# Patient Record
Sex: Female | Born: 1955 | Race: Black or African American | Hispanic: No | Marital: Married | Smoking: Former smoker
Health system: Southern US, Community
[De-identification: ages and names within clinical notes are randomized; demographics above are authoritative.]

## PROBLEM LIST (undated history)

## (undated) DIAGNOSIS — R0600 Dyspnea, unspecified: Secondary | ICD-10-CM

## (undated) DIAGNOSIS — E119 Type 2 diabetes mellitus without complications: Secondary | ICD-10-CM

## (undated) DIAGNOSIS — E785 Hyperlipidemia, unspecified: Secondary | ICD-10-CM

## (undated) DIAGNOSIS — K279 Peptic ulcer, site unspecified, unspecified as acute or chronic, without hemorrhage or perforation: Secondary | ICD-10-CM

## (undated) DIAGNOSIS — M1990 Unspecified osteoarthritis, unspecified site: Secondary | ICD-10-CM

## (undated) DIAGNOSIS — I10 Essential (primary) hypertension: Secondary | ICD-10-CM

## (undated) DIAGNOSIS — I739 Peripheral vascular disease, unspecified: Secondary | ICD-10-CM

## (undated) DIAGNOSIS — D649 Anemia, unspecified: Secondary | ICD-10-CM

## (undated) DIAGNOSIS — H269 Unspecified cataract: Secondary | ICD-10-CM

## (undated) DIAGNOSIS — K219 Gastro-esophageal reflux disease without esophagitis: Secondary | ICD-10-CM

## (undated) DIAGNOSIS — N809 Endometriosis, unspecified: Secondary | ICD-10-CM

## (undated) DIAGNOSIS — I6529 Occlusion and stenosis of unspecified carotid artery: Secondary | ICD-10-CM

## (undated) DIAGNOSIS — D329 Benign neoplasm of meninges, unspecified: Secondary | ICD-10-CM

## (undated) DIAGNOSIS — E042 Nontoxic multinodular goiter: Secondary | ICD-10-CM

## (undated) HISTORY — DX: Peptic ulcer, site unspecified, unspecified as acute or chronic, without hemorrhage or perforation: K27.9

## (undated) HISTORY — DX: Unspecified osteoarthritis, unspecified site: M19.90

## (undated) HISTORY — DX: Unspecified cataract: H26.9

## (undated) HISTORY — DX: Type 2 diabetes mellitus without complications: E11.9

## (undated) HISTORY — DX: Hyperlipidemia, unspecified: E78.5

## (undated) HISTORY — PX: CT RADIATION THERAPY GUIDE: HXRAD513

## (undated) HISTORY — PX: CYST EXCISION: SHX5701

## (undated) HISTORY — PX: TUBAL LIGATION: SHX77

## (undated) HISTORY — DX: Occlusion and stenosis of unspecified carotid artery: I65.29

## (undated) HISTORY — DX: Benign neoplasm of meninges, unspecified: D32.9

## (undated) HISTORY — PX: OVARIAN CYST REMOVAL: SHX89

## (undated) HISTORY — PX: OTHER SURGICAL HISTORY: SHX169

## (undated) HISTORY — DX: Peripheral vascular disease, unspecified: I73.9

## (undated) HISTORY — DX: Endometriosis, unspecified: N80.9

## (undated) HISTORY — PX: ORIF ANKLE FRACTURE: SUR919

## (undated) HISTORY — PX: SHOULDER SURGERY: SHX246

## (undated) HISTORY — DX: Nontoxic multinodular goiter: E04.2

---
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
## 1997-09-16 ENCOUNTER — Inpatient Hospital Stay (HOSPITAL_COMMUNITY): Admission: AD | Admit: 1997-09-16 | Discharge: 1997-09-16 | Payer: Self-pay | Admitting: *Deleted

## 1997-10-30 ENCOUNTER — Observation Stay (HOSPITAL_COMMUNITY): Admission: RE | Admit: 1997-10-30 | Discharge: 1997-10-31 | Payer: Self-pay | Admitting: *Deleted

## 1998-08-20 ENCOUNTER — Other Ambulatory Visit: Admission: RE | Admit: 1998-08-20 | Discharge: 1998-08-20 | Payer: Self-pay | Admitting: *Deleted

## 1999-01-17 ENCOUNTER — Inpatient Hospital Stay (HOSPITAL_COMMUNITY): Admission: AD | Admit: 1999-01-17 | Discharge: 1999-01-17 | Payer: Self-pay | Admitting: Obstetrics & Gynecology

## 1999-08-25 ENCOUNTER — Other Ambulatory Visit: Admission: RE | Admit: 1999-08-25 | Discharge: 1999-08-25 | Payer: Self-pay | Admitting: *Deleted

## 1999-12-21 ENCOUNTER — Ambulatory Visit (HOSPITAL_BASED_OUTPATIENT_CLINIC_OR_DEPARTMENT_OTHER): Admission: RE | Admit: 1999-12-21 | Discharge: 1999-12-21 | Payer: Self-pay | Admitting: Orthopaedic Surgery

## 2000-05-16 ENCOUNTER — Ambulatory Visit (HOSPITAL_BASED_OUTPATIENT_CLINIC_OR_DEPARTMENT_OTHER): Admission: RE | Admit: 2000-05-16 | Discharge: 2000-05-16 | Payer: Self-pay | Admitting: Orthopaedic Surgery

## 2000-08-23 ENCOUNTER — Other Ambulatory Visit: Admission: RE | Admit: 2000-08-23 | Discharge: 2000-08-23 | Payer: Self-pay | Admitting: *Deleted

## 2000-09-11 ENCOUNTER — Encounter: Payer: Self-pay | Admitting: *Deleted

## 2000-09-11 ENCOUNTER — Ambulatory Visit (HOSPITAL_COMMUNITY): Admission: RE | Admit: 2000-09-11 | Discharge: 2000-09-11 | Payer: Self-pay | Admitting: *Deleted

## 2000-12-13 ENCOUNTER — Emergency Department (HOSPITAL_COMMUNITY): Admission: EM | Admit: 2000-12-13 | Discharge: 2000-12-13 | Payer: Self-pay | Admitting: Emergency Medicine

## 2001-05-25 ENCOUNTER — Encounter: Payer: Self-pay | Admitting: Emergency Medicine

## 2001-05-25 ENCOUNTER — Emergency Department (HOSPITAL_COMMUNITY): Admission: EM | Admit: 2001-05-25 | Discharge: 2001-05-25 | Payer: Self-pay | Admitting: Emergency Medicine

## 2001-06-16 ENCOUNTER — Emergency Department (HOSPITAL_COMMUNITY): Admission: EM | Admit: 2001-06-16 | Discharge: 2001-06-16 | Payer: Self-pay | Admitting: Emergency Medicine

## 2001-06-16 ENCOUNTER — Encounter: Payer: Self-pay | Admitting: Emergency Medicine

## 2001-08-28 ENCOUNTER — Other Ambulatory Visit: Admission: RE | Admit: 2001-08-28 | Discharge: 2001-08-28 | Payer: Self-pay | Admitting: *Deleted

## 2001-09-10 ENCOUNTER — Encounter: Payer: Self-pay | Admitting: Urology

## 2001-09-10 ENCOUNTER — Ambulatory Visit (HOSPITAL_COMMUNITY): Admission: RE | Admit: 2001-09-10 | Discharge: 2001-09-10 | Payer: Self-pay | Admitting: Urology

## 2001-09-17 ENCOUNTER — Encounter: Payer: Self-pay | Admitting: *Deleted

## 2001-09-17 ENCOUNTER — Ambulatory Visit (HOSPITAL_COMMUNITY): Admission: RE | Admit: 2001-09-17 | Discharge: 2001-09-17 | Payer: Self-pay | Admitting: *Deleted

## 2001-11-20 ENCOUNTER — Emergency Department (HOSPITAL_COMMUNITY): Admission: EM | Admit: 2001-11-20 | Discharge: 2001-11-20 | Payer: Self-pay | Admitting: Emergency Medicine

## 2002-05-22 ENCOUNTER — Encounter: Payer: Self-pay | Admitting: Emergency Medicine

## 2002-05-22 ENCOUNTER — Emergency Department (HOSPITAL_COMMUNITY): Admission: EM | Admit: 2002-05-22 | Discharge: 2002-05-22 | Payer: Self-pay | Admitting: Emergency Medicine

## 2002-08-27 ENCOUNTER — Other Ambulatory Visit: Admission: RE | Admit: 2002-08-27 | Discharge: 2002-08-27 | Payer: Self-pay | Admitting: *Deleted

## 2002-09-20 ENCOUNTER — Ambulatory Visit (HOSPITAL_COMMUNITY): Admission: RE | Admit: 2002-09-20 | Discharge: 2002-09-20 | Payer: Self-pay | Admitting: *Deleted

## 2002-09-20 ENCOUNTER — Encounter: Payer: Self-pay | Admitting: *Deleted

## 2003-03-25 LAB — HM COLONOSCOPY

## 2003-09-03 ENCOUNTER — Other Ambulatory Visit: Admission: RE | Admit: 2003-09-03 | Discharge: 2003-09-03 | Payer: Self-pay | Admitting: Obstetrics and Gynecology

## 2003-10-14 ENCOUNTER — Encounter: Admission: RE | Admit: 2003-10-14 | Discharge: 2003-10-14 | Payer: Self-pay | Admitting: Obstetrics and Gynecology

## 2004-06-26 ENCOUNTER — Inpatient Hospital Stay (HOSPITAL_COMMUNITY): Admission: EM | Admit: 2004-06-26 | Discharge: 2004-06-28 | Payer: Self-pay | Admitting: Emergency Medicine

## 2004-08-30 ENCOUNTER — Other Ambulatory Visit: Admission: RE | Admit: 2004-08-30 | Discharge: 2004-08-30 | Payer: Self-pay | Admitting: Obstetrics and Gynecology

## 2004-10-25 ENCOUNTER — Ambulatory Visit (HOSPITAL_COMMUNITY): Admission: RE | Admit: 2004-10-25 | Discharge: 2004-10-25 | Payer: Self-pay | Admitting: Obstetrics and Gynecology

## 2004-12-06 ENCOUNTER — Ambulatory Visit (HOSPITAL_BASED_OUTPATIENT_CLINIC_OR_DEPARTMENT_OTHER): Admission: RE | Admit: 2004-12-06 | Discharge: 2004-12-06 | Payer: Self-pay | Admitting: Orthopedic Surgery

## 2005-04-04 ENCOUNTER — Ambulatory Visit: Payer: Self-pay | Admitting: Internal Medicine

## 2005-04-11 ENCOUNTER — Ambulatory Visit: Payer: Self-pay | Admitting: Internal Medicine

## 2005-09-07 ENCOUNTER — Other Ambulatory Visit: Admission: RE | Admit: 2005-09-07 | Discharge: 2005-09-07 | Payer: Self-pay | Admitting: Obstetrics and Gynecology

## 2005-09-12 ENCOUNTER — Ambulatory Visit: Payer: Self-pay | Admitting: Internal Medicine

## 2005-09-19 ENCOUNTER — Encounter: Payer: Self-pay | Admitting: Internal Medicine

## 2005-09-19 LAB — CONVERTED CEMR LAB

## 2005-10-03 ENCOUNTER — Emergency Department (HOSPITAL_COMMUNITY): Admission: EM | Admit: 2005-10-03 | Discharge: 2005-10-04 | Payer: Self-pay | Admitting: Emergency Medicine

## 2005-10-27 ENCOUNTER — Ambulatory Visit (HOSPITAL_COMMUNITY): Admission: RE | Admit: 2005-10-27 | Discharge: 2005-10-27 | Payer: Self-pay | Admitting: Obstetrics and Gynecology

## 2005-11-24 ENCOUNTER — Encounter: Admission: RE | Admit: 2005-11-24 | Discharge: 2005-11-24 | Payer: Self-pay | Admitting: Obstetrics and Gynecology

## 2005-12-01 ENCOUNTER — Ambulatory Visit: Payer: Self-pay | Admitting: Endocrinology

## 2005-12-07 ENCOUNTER — Ambulatory Visit: Payer: Self-pay | Admitting: *Deleted

## 2005-12-12 ENCOUNTER — Ambulatory Visit: Payer: Self-pay | Admitting: Endocrinology

## 2005-12-18 ENCOUNTER — Ambulatory Visit (HOSPITAL_COMMUNITY): Admission: RE | Admit: 2005-12-18 | Discharge: 2005-12-18 | Payer: Self-pay | Admitting: Endocrinology

## 2005-12-28 ENCOUNTER — Other Ambulatory Visit: Admission: RE | Admit: 2005-12-28 | Discharge: 2005-12-28 | Payer: Self-pay | Admitting: Interventional Radiology

## 2005-12-28 ENCOUNTER — Encounter (INDEPENDENT_AMBULATORY_CARE_PROVIDER_SITE_OTHER): Payer: Self-pay | Admitting: Specialist

## 2005-12-28 ENCOUNTER — Encounter: Admission: RE | Admit: 2005-12-28 | Discharge: 2005-12-28 | Payer: Self-pay | Admitting: Endocrinology

## 2005-12-29 ENCOUNTER — Ambulatory Visit: Payer: Self-pay | Admitting: Internal Medicine

## 2006-01-12 ENCOUNTER — Ambulatory Visit: Payer: Self-pay | Admitting: Internal Medicine

## 2006-03-10 ENCOUNTER — Ambulatory Visit: Payer: Self-pay | Admitting: Internal Medicine

## 2006-04-19 ENCOUNTER — Ambulatory Visit: Payer: Self-pay | Admitting: Cardiovascular Disease

## 2006-04-19 ENCOUNTER — Encounter: Payer: Self-pay | Admitting: Cardiovascular Disease

## 2006-04-19 ENCOUNTER — Ambulatory Visit (HOSPITAL_COMMUNITY): Admission: RE | Admit: 2006-04-19 | Discharge: 2006-04-19 | Payer: Self-pay | Admitting: Neurosurgery

## 2006-05-05 ENCOUNTER — Ambulatory Visit: Payer: Self-pay | Admitting: Internal Medicine

## 2006-05-05 LAB — CONVERTED CEMR LAB
AST: 17 units/L (ref 0–37)
Alkaline Phosphatase: 86 units/L (ref 39–117)
BUN: 11 mg/dL (ref 6–23)
Basophils Absolute: 0 10*3/uL (ref 0.0–0.1)
CO2: 28 meq/L (ref 19–32)
Calcium: 9.1 mg/dL (ref 8.4–10.5)
Chol/HDL Ratio, serum: 3.7
Cholesterol: 166 mg/dL (ref 0–200)
Eosinophil percent: 2.3 % (ref 0.0–5.0)
HDL: 44.9 mg/dL (ref >39.0)
Hemoglobin, Urine: NEGATIVE
Hemoglobin: 10.8 g/dL — ABNORMAL LOW (ref 12.0–15.0)
Ketones, ur: NEGATIVE mg/dL
LDL Cholesterol: 113 mg/dL — ABNORMAL HIGH (ref 0–99)
Lymphocytes Relative: 18.9 % (ref 12.0–46.0)
MCHC: 33.4 g/dL (ref 30.0–36.0)
MCV: 84.3 fL (ref 78.0–100.0)
Monocytes Absolute: 0.7 10*3/uL (ref 0.2–0.7)
Monocytes Relative: 7.6 % (ref 3.0–11.0)
Neutro Abs: 6.2 10*3/uL (ref 1.4–7.7)
Neutrophils Relative %: 71.1 % (ref 43.0–77.0)
Sodium: 144 meq/L (ref 135–145)
TSH: 1.83 microintl units/mL (ref 0.35–5.50)
Total Bilirubin: 0.5 mg/dL (ref 0.3–1.2)

## 2006-05-12 ENCOUNTER — Ambulatory Visit: Payer: Self-pay | Admitting: Internal Medicine

## 2006-06-07 ENCOUNTER — Ambulatory Visit: Payer: Self-pay | Admitting: Internal Medicine

## 2006-09-17 ENCOUNTER — Emergency Department (HOSPITAL_COMMUNITY): Admission: EM | Admit: 2006-09-17 | Discharge: 2006-09-17 | Payer: Self-pay | Admitting: *Deleted

## 2006-09-18 ENCOUNTER — Ambulatory Visit (HOSPITAL_COMMUNITY): Admission: RE | Admit: 2006-09-18 | Discharge: 2006-09-18 | Payer: Self-pay | Admitting: Obstetrics and Gynecology

## 2006-09-20 LAB — CONVERTED CEMR LAB

## 2006-10-30 ENCOUNTER — Encounter: Admission: RE | Admit: 2006-10-30 | Discharge: 2006-10-30 | Payer: Self-pay | Admitting: Obstetrics and Gynecology

## 2007-01-10 ENCOUNTER — Ambulatory Visit: Payer: Self-pay | Admitting: Endocrinology

## 2007-01-12 ENCOUNTER — Encounter: Admission: RE | Admit: 2007-01-12 | Discharge: 2007-01-12 | Payer: Self-pay | Admitting: Endocrinology

## 2007-02-06 ENCOUNTER — Encounter: Payer: Self-pay | Admitting: Internal Medicine

## 2007-02-06 DIAGNOSIS — I6529 Occlusion and stenosis of unspecified carotid artery: Secondary | ICD-10-CM

## 2007-02-06 DIAGNOSIS — D320 Benign neoplasm of cerebral meninges: Secondary | ICD-10-CM | POA: Insufficient documentation

## 2007-02-06 DIAGNOSIS — I739 Peripheral vascular disease, unspecified: Secondary | ICD-10-CM | POA: Insufficient documentation

## 2007-02-06 DIAGNOSIS — E042 Nontoxic multinodular goiter: Secondary | ICD-10-CM | POA: Insufficient documentation

## 2007-02-06 DIAGNOSIS — F959 Tic disorder, unspecified: Secondary | ICD-10-CM | POA: Insufficient documentation

## 2007-02-06 DIAGNOSIS — Z87898 Personal history of other specified conditions: Secondary | ICD-10-CM | POA: Insufficient documentation

## 2007-02-06 DIAGNOSIS — I779 Disorder of arteries and arterioles, unspecified: Secondary | ICD-10-CM | POA: Insufficient documentation

## 2007-05-03 ENCOUNTER — Encounter (INDEPENDENT_AMBULATORY_CARE_PROVIDER_SITE_OTHER): Payer: Self-pay | Admitting: *Deleted

## 2007-05-03 ENCOUNTER — Ambulatory Visit: Payer: Self-pay | Admitting: Internal Medicine

## 2007-05-03 LAB — CONVERTED CEMR LAB
BUN: 7 mg/dL (ref 6–23)
Basophils Absolute: 0.1 10*3/uL (ref 0.0–0.1)
Bilirubin, Direct: 0.1 mg/dL (ref 0.0–0.3)
CO2: 31 meq/L (ref 19–32)
Chloride: 105 meq/L (ref 96–112)
Creatinine, Ser: 0.7 mg/dL (ref 0.4–1.2)
GFR calc Af Amer: 114 mL/min
Glucose, Bld: 115 mg/dL — ABNORMAL HIGH (ref 70–99)
HCT: 32.8 % — ABNORMAL LOW (ref 36.0–46.0)
Hemoglobin, Urine: NEGATIVE
Ketones, ur: NEGATIVE mg/dL
LDL Cholesterol: 120 mg/dL — ABNORMAL HIGH (ref 0–99)
MCHC: 33.8 g/dL (ref 30.0–36.0)
Neutrophils Relative %: 68.2 % (ref 43.0–77.0)
Nitrite: NEGATIVE
Potassium: 3.4 meq/L — ABNORMAL LOW (ref 3.5–5.1)
RBC: 3.75 M/uL — ABNORMAL LOW (ref 3.87–5.11)
RDW: 15.5 % — ABNORMAL HIGH (ref 11.5–14.6)
Sodium: 140 meq/L (ref 135–145)
TSH: 1.2 microintl units/mL (ref 0.35–5.50)
Total Bilirubin: 0.5 mg/dL (ref 0.3–1.2)
Total Protein, Urine: NEGATIVE mg/dL
Total Protein: 7 g/dL (ref 6.0–8.3)
Urobilinogen, UA: 0.2 (ref 0.0–1.0)
VLDL: 19 mg/dL (ref 0–40)
pH: 6.5 (ref 5.0–8.0)

## 2007-05-14 ENCOUNTER — Ambulatory Visit: Payer: Self-pay | Admitting: Internal Medicine

## 2007-05-14 DIAGNOSIS — K279 Peptic ulcer, site unspecified, unspecified as acute or chronic, without hemorrhage or perforation: Secondary | ICD-10-CM | POA: Insufficient documentation

## 2007-06-29 ENCOUNTER — Ambulatory Visit (HOSPITAL_BASED_OUTPATIENT_CLINIC_OR_DEPARTMENT_OTHER): Admission: RE | Admit: 2007-06-29 | Discharge: 2007-06-29 | Payer: Self-pay | Admitting: Orthopedic Surgery

## 2007-08-10 ENCOUNTER — Encounter: Payer: Self-pay | Admitting: Internal Medicine

## 2007-11-05 ENCOUNTER — Ambulatory Visit (HOSPITAL_COMMUNITY): Admission: RE | Admit: 2007-11-05 | Discharge: 2007-11-05 | Payer: Self-pay | Admitting: Obstetrics and Gynecology

## 2007-12-13 ENCOUNTER — Encounter: Payer: Self-pay | Admitting: Internal Medicine

## 2007-12-20 ENCOUNTER — Telehealth (INDEPENDENT_AMBULATORY_CARE_PROVIDER_SITE_OTHER): Payer: Self-pay | Admitting: *Deleted

## 2007-12-23 ENCOUNTER — Encounter: Admission: RE | Admit: 2007-12-23 | Discharge: 2007-12-23 | Payer: Self-pay | Admitting: Orthopaedic Surgery

## 2008-01-07 ENCOUNTER — Ambulatory Visit: Payer: Self-pay | Admitting: Endocrinology

## 2008-01-21 ENCOUNTER — Ambulatory Visit: Payer: Self-pay | Admitting: Internal Medicine

## 2008-01-21 DIAGNOSIS — R252 Cramp and spasm: Secondary | ICD-10-CM | POA: Insufficient documentation

## 2008-01-22 LAB — CONVERTED CEMR LAB
BUN: 17 mg/dL (ref 6–23)
CO2: 28 meq/L (ref 19–32)
GFR calc Af Amer: 113 mL/min
Glucose, Bld: 87 mg/dL (ref 70–99)
Potassium: 4.4 meq/L (ref 3.5–5.1)
Sodium: 138 meq/L (ref 135–145)

## 2008-01-23 ENCOUNTER — Encounter: Payer: Self-pay | Admitting: Internal Medicine

## 2008-02-02 ENCOUNTER — Emergency Department (HOSPITAL_COMMUNITY): Admission: EM | Admit: 2008-02-02 | Discharge: 2008-02-02 | Payer: Self-pay | Admitting: Emergency Medicine

## 2008-02-04 ENCOUNTER — Ambulatory Visit: Payer: Self-pay | Admitting: Internal Medicine

## 2008-08-11 ENCOUNTER — Encounter: Payer: Self-pay | Admitting: Internal Medicine

## 2008-08-14 ENCOUNTER — Ambulatory Visit: Payer: Self-pay | Admitting: Internal Medicine

## 2008-08-14 LAB — CONVERTED CEMR LAB
AST: 26 units/L (ref 0–37)
Alkaline Phosphatase: 81 units/L (ref 39–117)
Basophils Absolute: 0.1 10*3/uL (ref 0.0–0.1)
Basophils Relative: 1 % (ref 0.0–3.0)
Bilirubin, Direct: 0.1 mg/dL (ref 0.0–0.3)
Calcium: 9.1 mg/dL (ref 8.4–10.5)
Cholesterol: 190 mg/dL (ref 0–200)
Creatinine, Ser: 0.7 mg/dL (ref 0.4–1.2)
Eosinophils Absolute: 0.4 10*3/uL (ref 0.0–0.7)
GFR calc Af Amer: 113 mL/min
HCT: 33.1 % — ABNORMAL LOW (ref 36.0–46.0)
Hemoglobin: 11.2 g/dL — ABNORMAL LOW (ref 12.0–15.0)
Ketones, ur: NEGATIVE mg/dL
Leukocytes, UA: NEGATIVE
Lymphocytes Relative: 24.5 % (ref 12.0–46.0)
MCHC: 33.7 g/dL (ref 30.0–36.0)
MCV: 87 fL (ref 78.0–100.0)
Monocytes Absolute: 0.6 10*3/uL (ref 0.1–1.0)
Neutro Abs: 4.3 10*3/uL (ref 1.4–7.7)
RBC: 3.81 M/uL — ABNORMAL LOW (ref 3.87–5.11)
RDW: 15.6 % — ABNORMAL HIGH (ref 11.5–14.6)
Sodium: 141 meq/L (ref 135–145)
Specific Gravity, Urine: 1.025 (ref 1.000–1.035)
Total Bilirubin: 0.7 mg/dL (ref 0.3–1.2)
Total CHOL/HDL Ratio: 4.1
Triglycerides: 48 mg/dL (ref 0–149)
pH: 6 (ref 5.0–8.0)

## 2008-08-17 ENCOUNTER — Encounter: Payer: Self-pay | Admitting: Internal Medicine

## 2008-09-03 LAB — CONVERTED CEMR LAB

## 2008-09-15 ENCOUNTER — Ambulatory Visit: Payer: Self-pay | Admitting: Internal Medicine

## 2008-11-10 ENCOUNTER — Ambulatory Visit (HOSPITAL_COMMUNITY): Admission: RE | Admit: 2008-11-10 | Discharge: 2008-11-10 | Payer: Self-pay | Admitting: Internal Medicine

## 2008-11-10 LAB — HM MAMMOGRAPHY: HM Mammogram: NEGATIVE

## 2008-12-16 ENCOUNTER — Encounter: Payer: Self-pay | Admitting: Internal Medicine

## 2009-06-01 ENCOUNTER — Encounter: Payer: Self-pay | Admitting: Internal Medicine

## 2009-06-09 ENCOUNTER — Ambulatory Visit: Payer: Self-pay | Admitting: Internal Medicine

## 2009-06-09 DIAGNOSIS — I10 Essential (primary) hypertension: Secondary | ICD-10-CM | POA: Insufficient documentation

## 2009-06-23 ENCOUNTER — Ambulatory Visit: Payer: Self-pay | Admitting: Internal Medicine

## 2009-06-23 LAB — CONVERTED CEMR LAB
CO2: 30 meq/L (ref 19–32)
Chloride: 102 meq/L (ref 96–112)
Potassium: 3.9 meq/L (ref 3.5–5.1)
Sodium: 139 meq/L (ref 135–145)

## 2009-06-24 ENCOUNTER — Telehealth: Payer: Self-pay | Admitting: Internal Medicine

## 2009-08-14 ENCOUNTER — Encounter: Payer: Self-pay | Admitting: Internal Medicine

## 2009-08-25 ENCOUNTER — Ambulatory Visit: Payer: Self-pay | Admitting: Internal Medicine

## 2009-08-25 LAB — CONVERTED CEMR LAB: Magnesium: 2.4 mg/dL (ref 1.5–2.5)

## 2009-09-14 ENCOUNTER — Ambulatory Visit: Payer: Self-pay | Admitting: Internal Medicine

## 2009-09-14 LAB — CONVERTED CEMR LAB
AST: 21 units/L (ref 0–37)
Alkaline Phosphatase: 96 units/L (ref 39–117)
Basophils Absolute: 0.1 10*3/uL (ref 0.0–0.1)
Bilirubin, Direct: 0.1 mg/dL (ref 0.0–0.3)
Calcium: 8.8 mg/dL (ref 8.4–10.5)
Cholesterol: 183 mg/dL (ref 0–200)
Creatinine, Ser: 0.8 mg/dL (ref 0.4–1.2)
GFR calc non Af Amer: 96.48 mL/min (ref >60)
Hemoglobin: 11.1 g/dL — ABNORMAL LOW (ref 12.0–15.0)
LDL Cholesterol: 120 mg/dL — ABNORMAL HIGH (ref 0–99)
Lymphocytes Relative: 23.2 % (ref 12.0–46.0)
Monocytes Relative: 9.4 % (ref 3.0–12.0)
Neutro Abs: 5.5 10*3/uL (ref 1.4–7.7)
Neutrophils Relative %: 63.1 % (ref 43.0–77.0)
Platelets: 262 10*3/uL (ref 150.0–400.0)
Potassium: 3.8 meq/L (ref 3.5–5.1)
RDW: 15.9 % — ABNORMAL HIGH (ref 11.5–14.6)
Specific Gravity, Urine: >=1.03 (ref 1.000–1.030)
TSH: 1.8 microintl units/mL (ref 0.35–5.50)
Total Bilirubin: 0.5 mg/dL (ref 0.3–1.2)
Total Protein: 6.9 g/dL (ref 6.0–8.3)
Urine Glucose: NEGATIVE mg/dL
Urobilinogen, UA: 0.2 (ref 0.0–1.0)

## 2009-09-17 ENCOUNTER — Ambulatory Visit: Payer: Self-pay | Admitting: Internal Medicine

## 2009-11-11 ENCOUNTER — Ambulatory Visit (HOSPITAL_COMMUNITY): Admission: RE | Admit: 2009-11-11 | Discharge: 2009-11-11 | Payer: Self-pay | Admitting: Obstetrics and Gynecology

## 2009-12-15 ENCOUNTER — Encounter: Payer: Self-pay | Admitting: Endocrinology

## 2010-06-20 HISTORY — PX: TRIGGER FINGER RELEASE: SHX641

## 2010-07-11 ENCOUNTER — Encounter: Payer: Self-pay | Admitting: Obstetrics and Gynecology

## 2010-07-22 NOTE — Letter (Signed)
Summary: [facility]  [facility]   Imported By: Sherian Rein 09/07/2009 08:23:48  _____________________________________________________________________  External Attachment:    Type:   Image     Comment:   External Document

## 2010-07-22 NOTE — Assessment & Plan Note (Signed)
Summary: HANDS LEGS FEET CRAMPS--DR MEN PT STC   Vital Signs:  Patient profile:   55 year old female Height:      66 inches (167.64 cm) Weight:      186.0 pounds (84.55 kg) O2 Sat:      97 % on Room air Temp:     97.2 degrees F (36.22 degrees C) oral Pulse rate:   85 / minute BP sitting:   98 / 62  (right arm) Cuff size:   large  Vitals Entered By: Orlan Leavens (August 25, 2009 1:21 PM)  O2 Flow:  Room air CC: cramps in legs & feet, also in hands Is Patient Diabetic? No Pain Assessment Patient in pain? yes     Location: legs, feet and hands Type: ongoing cramps   Primary Care Provider:  Norins  CC:  cramps in legs & feet and also in hands.  History of Present Illness: c/o cramps in BLE - onset 2 weeks ago - can occur with exertion such as walking as well as nocturnal (laying in bed at night) spasms last 3 minutes to over 10 minutes feels sore in thighs after cramp resolves no muscle weakness - no medication changes - no new OTC or rx meds  Clinical Review Panels:  CBC   WBC:  7.1 (08/14/2008)   RBC:  3.81 (08/14/2008)   Hgb:  11.2 (08/14/2008)   Hct:  33.1 (08/14/2008)   Platelets:  225 (08/14/2008)   MCV  87.0 (08/14/2008)   MCHC  33.7 (08/14/2008)   RDW  15.6 (08/14/2008)   PMN:  60.2 (08/14/2008)   Lymphs:  24.5 (08/14/2008)   Monos:  9.1 (08/14/2008)   Eosinophils:  5.2 (08/14/2008)   Basophil:  1.0 (08/14/2008)  Complete Metabolic Panel   Glucose:  100 (06/23/2009)   Sodium:  139 (06/23/2009)   Potassium:  3.9 (06/23/2009)   Chloride:  102 (06/23/2009)   CO2:  30 (06/23/2009)   BUN:  12 (06/23/2009)   Creatinine:  0.9 (06/23/2009)   Albumin:  3.7 (08/14/2008)   Total Protein:  7.0 (08/14/2008)   Calcium:  9.0 (06/23/2009)   Total Bili:  0.7 (08/14/2008)   Alk Phos:  81 (08/14/2008)   SGPT (ALT):  19 (08/14/2008)   SGOT (AST):  26 (08/14/2008)   Current Medications (verified): 1)  Protonix 40 Mg  Pack (Pantoprazole Sodium) .... Every Am 2)   Ambien 10 Mg  Tabs (Zolpidem Tartrate) .... Qhs 3)  Aspirin 325mg  .... Once Daily 4)  Excedrin Migraine 250-250-65 Mg  Tabs (Aspirin-Acetaminophen-Caffeine) .... As Needed 5)  Klor-Con M10 10 Meq  Cr-Tabs (Potassium Chloride Crys Cr) .Marland Kitchen.. 1 By Mouth As Directed. 6)  Gabapentin 100 Mg  Caps (Gabapentin) .Marland Kitchen.. 1 By Mouth Once Daily For Cramps 7)  Lisinopril-Hydrochlorothiazide 10-12.5 Mg Tabs (Lisinopril-Hydrochlorothiazide) .Marland Kitchen.. 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 Andrey Campanile [facility]        Ortho - Daldorf        Opthal - [place], Dr. Daphine Deutscher at [facility] - [facility] All        GI - Arlyce Dice        Back  surgeon - Dorna Bloom  Review of Systems  The patient denies anorexia, chest pain, syncope, and headaches.    Physical Exam  General:  Well-developed,well-nourished,in no acute distress; alert,appropriate and cooperative throughout examination - nontoxic Eyes:  vision grossly intact; pupils equal, round and reactive to light.  conjunctiva and lids normal.    Lungs:  normal respiratory effort, no intercostal retractions or use of accessory muscles; normal breath sounds bilaterally - no crackles and no wheezes.    Heart:  normal rate, regular rhythm, no murmur, and no rub. BLE without edema.  Msk:  No deformity or scoliosis noted of thoracic or lumbar spine.  no spasms or effusions   Impression & Recommendations:  Problem # 1:  CRAMP OF LIMB (ICD-729.82)  Patient with increasingly severe carpal spasm and leg cramps.   Plan: lab to include Mg and K and iron levels;  tonic water - 8-16 oz daily. also flexeril to use at night as needed   Orders: TLB-Potassium (K+)  (84132-K) TLB-Magnesium (Mg) (83735-MG) TLB-Iron, (Fe) Total (83540-FE) Prescription Created Electronically [phone])  Problem # 2:  ESSENTIAL HYPERTENSION (ICD-401.9)  low BP now - ?if med contrib to above symptoms? asked to hold med until f/u with PCP at end of this mo for CPX Her updated medication list for this problem includes:    Lisinopril-hydrochlorothiazide 10-12.5 Mg Tabs (Lisinopril-hydrochlorothiazide) ..... Hold until follow up with dr. Debby Bud  BP today: 98/62 Prior BP: 110/68 (06/23/2009)  Labs Reviewed: K+: 3.9 (06/23/2009) Creat: : 0.9 (06/23/2009)   Chol: 190 (08/14/2008)   HDL: 46.8 (08/14/2008)   LDL: 134 (08/14/2008)   TG: 48 (08/14/2008)  Complete Medication List: 1)  Protonix 40 Mg Pack (Pantoprazole sodium) .... Every am 2)  Ambien 10 Mg Tabs (Zolpidem tartrate) .... Qhs 3)  Aspirin 325mg   .... Once daily 4)  Excedrin Migraine 250-250-65 Mg Tabs (Aspirin-acetaminophen-caffeine) .... As needed 5)  Gabapentin 100 Mg Caps (Gabapentin) .Marland Kitchen.. 1 by mouth once daily for cramps 6)  Lisinopril-hydrochlorothiazide 10-12.5 Mg Tabs (Lisinopril-hydrochlorothiazide) .... Hold until follow up with dr. Debby Bud 7)  Flexeril 10 Mg Tabs (Cyclobenzaprine hcl) .Marland Kitchen.. 1 by mouth every 8 hours as needed for muscle spasms and pain  Patient Instructions: 1)  it was good to see you today. 2)  test(s) ordered today - your results will be called to you  in 48-72 hours from the time of test completion 3)  hold your blood pressure medicine until next visit with dr. Debby Bud at the end of the month 4)  try flexeril for muscle relaxants - if other medications needed for potassium or magnesium after reviewing your labs, you will be notified Prescriptions: FLEXERIL 10 MG TABS (CYCLOBENZAPRINE HCL) 1 by mouth every 8 hours as needed for muscle spasms and pain  #30 x 0   Entered and Authorized by:   Newt Lukes MD   Signed by:   Newt Lukes MD on 08/25/2009   Method used:    [facility]  [address] [id number]       [address]       [place], [place]  [address]       Ph: [phone] or [phone]       Fax: [phone]   RxID:   [id number]

## 2010-07-22 NOTE — Progress Notes (Signed)
  Phone Note Outgoing Call   Reason for Call: Discuss lab or test results Summary of Call: please call patient - metabolic panel 100% OK  TU Initial call taken by: Jacques Navy MD,  June 24, 2009 5:31 PM

## 2010-07-22 NOTE — Assessment & Plan Note (Signed)
Summary: 2 wk f/u #/cd   Vital Signs:  Patient profile:   55 year old female Height:      66 inches Weight:      187 pounds BMI:     30.29 O2 Sat:      97 % on Room air Temp:     98.0 degrees F oral Pulse rate:   73 / minute BP sitting:   110 / 68  (left arm) Cuff size:   large  Vitals Entered By: Ami Bullins CMA (June 23, 2009 1:13 PM)  O2 Flow:  Room air CC: pt here for follow up on BP. pt was put on lisinopril about 2 weeks ago and since starting medication BP readings have been averaging low 100's systolic over 70's for diastolic/ ab   Primary Care Provider:  Cherylynn Liszewski  CC:  pt here for follow up on BP. pt was put on lisinopril about 2 weeks ago and since starting medication BP readings have been averaging low 100's systolic over 70's for diastolic/ ab.  History of Present Illness: Patient seen Dec 21st for moderate hypertension and was started on lisinopril/hct. She returns for follow-up. Her home readings are great with an occasional low, e.g. 106/70. Sheis tolerating the medication well.  Current Medications (verified): 1)  Protonix 40 Mg  Pack (Pantoprazole Sodium) .... Every Am 2)  Ambien 10 Mg  Tabs (Zolpidem Tartrate) .... Qhs 3)  Aspirin 325mg  .... Once Daily 4)  Excedrin Migraine 250-250-65 Mg  Tabs (Aspirin-Acetaminophen-Caffeine) .... As Needed 5)  Klor-Con M10 10 Meq  Cr-Tabs (Potassium Chloride Crys Cr) .Marland Kitchen.. 1 By Mouth As Directed. 6)  Gabapentin 100 Mg  Caps (Gabapentin) .Marland Kitchen.. 1 By Mouth Once Daily For Cramps 7)  Lisinopril-Hydrochlorothiazide 10-12.5 Mg Tabs (Lisinopril-Hydrochlorothiazide) .Marland Kitchen.. 1 By Mouth Once Daily  Allergies (verified): No Known Drug Allergies PMH-FH-SH reviewed-no changes except otherwise noted  Review of Systems  The patient denies anorexia, weight loss, chest pain, dyspnea on exertion, prolonged cough, and headaches.    Physical Exam  General:  Well-developed,well-nourished,in no acute distress; alert,appropriate and cooperative  throughout examination Lungs:  normal respiratory effort and normal breath sounds.   Heart:  normal rate and regular rhythm.     Impression & Recommendations:  Problem # 1:  ESSENTIAL HYPERTENSION (ICD-401.9) Good control on present medication.  Plan - continue the same  Her updated medication list for this problem includes:    Lisinopril-hydrochlorothiazide 10-12.5 Mg Tabs (Lisinopril-hydrochlorothiazide) .Marland Kitchen... 1 by mouth once daily  Orders: TLB-BMP (Basic Metabolic Panel-BMET) (80048-METABOL)  Addendum - Patient: Jaime [name] Note: All result statuses are Final unless otherwise noted.  Tests: (1) BMP (METABOL)   Sodium                    139 mEq/L                   135-145   Potassium                 3.9 mEq/L                   3.5-5.1   Chloride                  102 mEq/L                   96-112   Carbon Dioxide            30 mEq/L  19-32   Glucose              [H]  100 mg/dL                   88-41   BUN                       12 mg/dL                    6-60   Creatinine                0.9 mg/dL                   6.3-0.1   Calcium                   9.0 mg/dL                   6.0-10.9   GFR                       84.29 mL/min                >60  Complete Medication List: 1)  Protonix 40 Mg Pack (Pantoprazole sodium) .... Every am 2)  Ambien 10 Mg Tabs (Zolpidem tartrate) .... Qhs 3)  Aspirin 325mg   .... Once daily 4)  Excedrin Migraine 250-250-65 Mg Tabs (Aspirin-acetaminophen-caffeine) .... As needed 5)  Klor-con M10 10 Meq Cr-tabs (Potassium chloride crys cr) .Marland Kitchen.. 1 by mouth as directed. 6)  Gabapentin 100 Mg Caps (Gabapentin) .Marland Kitchen.. 1 by mouth once daily for cramps 7)  Lisinopril-hydrochlorothiazide 10-12.5 Mg Tabs (Lisinopril-hydrochlorothiazide) .Marland Kitchen.. 1 by mouth once daily   Preventive Care Screening  Mammogram:    Date:  11/10/2008    Results:  normal bilateral     Not Administered:    Influenza Vaccine not given due to: declined

## 2010-07-22 NOTE — Letter (Signed)
Summary: [facility]  [facility]   Imported By: Sherian Rein 06/24/2009 14:20:26  _____________________________________________________________________  External Attachment:    Type:   Image     Comment:   External Document

## 2010-07-22 NOTE — Letter (Signed)
Summary: [facility]   Imported By: Sherian Rein 01/05/2010 10:23:35  _____________________________________________________________________  External Attachment:    Type:   Image     Comment:   External Document

## 2010-07-22 NOTE — Assessment & Plan Note (Signed)
Summary: CPX/[employer]/#/CD   Vital Signs:  Patient profile:   55 year old female Height:      66 inches (167.64 cm) Weight:      188 pounds (85.45 kg) BMI:     30.45 O2 Sat:      93 % on Room air Temp:     98.5 degrees F (36.94 degrees C) oral Pulse rate:   86 / minute Pulse rhythm:   regular BP sitting:   122 / 80  (left arm) Cuff size:   regular  Vitals Entered By: Brenton Grills (September 17, 2009 11:16 AM)  O2 Flow:  Room air CC: pt here for cpx/aj  Vision Screening:      Vision Comments: Last eye exam 08/14/2009 was normal   Primary Care Provider:  Norins  CC:  pt here for cpx/aj.  History of Present Illness: Patient presents for general medical exam. She was seen recently for severe cramps 3/11. She was advised to stop HCTZ and she was better but did have recurrent cramps, less severe, recently. K, Mg, Iron were normal with Iron being 59mcg/dl below normal.  Of note her  BP is well controlled.  She has seen her gyn for normal exam earlier this month - Dr. Stefano Gaul.  Current Medications (verified): 1)  Protonix 40 Mg  Pack (Pantoprazole Sodium) .... Every Am 2)  Ambien 10 Mg  Tabs (Zolpidem Tartrate) .... As Needed 3)  Aspirin 325mg  .... Once Daily 4)  Excedrin Migraine 250-250-65 Mg  Tabs (Aspirin-Acetaminophen-Caffeine) .... As Needed 5)  Gabapentin 100 Mg  Caps (Gabapentin) .Marland Kitchen.. 1 By Mouth Once Daily For Cramps 6)  Lisinopril-Hydrochlorothiazide 10-12.5 Mg Tabs (Lisinopril-Hydrochlorothiazide) .... Hold Until Follow Up With Dr. Debby Bud 7)  Flexeril 10 Mg Tabs (Cyclobenzaprine Hcl) .Marland Kitchen.. 1 By Mouth Every 8 Hours As Needed For Muscle Spasms and Pain 8)  Iron 325 (65 Fe) Mg Tabs (Ferrous Sulfate) .Marland Kitchen.. 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 Andrey [facility]        Ortho - Daldorf        Opthal - [place], Dr. 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 married 1989 daughter-1979, 2 step-sons. 2 grandchildren  Risk Factors: Alcohol Use: 0 (09/15/2008) Caffeine Use: 3 servings: 2 soft drinks, 1 tea (09/15/2008) Exercise: yes (09/15/2008)  Risk Factors: Smoking Status: quit (09/15/2008)  Review of Systems  The patient denies anorexia, fever, weight loss, weight gain, vision loss, decreased hearing, chest pain, syncope, dyspnea on exertion, prolonged cough, hemoptysis, abdominal pain, hematochezia, muscle weakness, difficulty walking, depression, abnormal bleeding, and angioedema.    Physical Exam  General:  Overweight AA female in distress Head:  Normocephalic and atraumatic without obvious abnormalities.  No apparent alopecia or balding. Eyes:  No corneal or conjunctival inflammation noted. EOMI. Perrla. Funduscopic exam benign, without hemorrhages, exudates or papilledema. Vision grossly normal. Ears:  External ear exam shows no significant lesions or deformities.  Otoscopic examination reveals clear canals, tympanic membranes are intact bilaterally without bulging, retraction, inflammation or discharge. Hearing is grossly normal bilaterally. Nose:  no external deformity and no external erythema.   Mouth:  Oral mucosa and oropharynx without lesions or exudates.   Teeth in good repair. Neck:  supple, full ROM, no thyromegaly, and no carotid bruits.   Chest Wall:  No deformities, masses, or tenderness noted. Lungs:  Normal respiratory effort, chest expands symmetrically. Lungs are clear to auscultation, no crackles or wheezes. Heart:  Normal rate and regular rhythm. S1 and S2 normal without gallop, murmur, click, rub or other extra sounds. Abdomen:  soft, non-tender, normal bowel sounds, no distention, no guarding, no rigidity, and no hepatomegaly.   Genitalia:  deferred Msk:  normal ROM, no joint tenderness, no joint swelling, no joint warmth, and no joint deformities.   Pulses:  2+ radial pulses Extremities:  trace left pedal edema.   Neurologic:  alert & oriented X3, cranial nerves II-XII intact, strength normal in all extremities, sensation intact to light touch, gait normal, and DTRs symmetrical and normal.   Skin:  turgor normal, color normal, no rashes, no purpura, and no ulcerations.   Cervical Nodes:  no anterior cervical adenopathy and no posterior cervical adenopathy.   Psych:  Oriented X3, memory intact for recent and remote, normally interactive, and not anxious appearing.     Impression & Recommendations:  Problem # 1:  ESSENTIAL HYPERTENSION (ICD-401.9)  Her updated medication list for this problem includes:    Lisinopril-hydrochlorothiazide 10-12.5 Mg Tabs (Lisinopril-hydrochlorothiazide) ..... Hold until follow up with dr. Debby Bud  BP today: 122/80 Prior BP: 98/62 (08/25/2009)  Good control on no medication.  Plan - monitor BP           if SBP runs consistently greater than 130 will need to consider restarting medication.  Problem # 2:  CRAMP OF LIMB (ICD-729.82) Labs are normal. Still having some cramps.  Plan - trial of tonic water, if this fails gabapentin 100mg  at bedtime can be tried.  Problem # 3:  PERIPHERAL VASCULAR DISEASE (ICD-443.9) Stable with no active complaints  Problem # 4:  GOITER, MULTINODULAR  (ICD-241.1) Stable  Problem # 5:  Preventive Health Care (ICD-V70.0) Normal physical exam. Current with gynecology exam and breast exam. Current with mammography, colonosocopy, tetnus immunization.  In summary - a nice woman who appears to be medically stable. She will report back if her BP is running above goal. She will return as needed.  Complete Medication List: 1)  Protonix 40 Mg Pack (Pantoprazole sodium) .... Every am 2)  Ambien 10 Mg Tabs (Zolpidem tartrate) .... As needed 3)  Aspirin 325mg   .... Once daily 4)  Excedrin Migraine 250-250-65 Mg Tabs (Aspirin-acetaminophen-caffeine) .... As needed 5)  Gabapentin 100 Mg Caps (Gabapentin) .Marland Kitchen.. 1 by mouth once daily for cramps 6)  Lisinopril-hydrochlorothiazide 10-12.5 Mg Tabs (Lisinopril-hydrochlorothiazide) .... Hold until follow up with dr. Debby Bud 7)  Flexeril 10 Mg Tabs (Cyclobenzaprine hcl) .Marland Kitchen.. 1 by mouth every 8 hours as needed for muscle spasms and pain 8)  Iron 325 (65 Fe) Mg Tabs (Ferrous sulfate) .Marland Kitchen.. 1 by mouth once daily    Note: All result statuses are Final unless otherwise noted.  Tests: (1) BMP (METABOL)   Sodium  142 mEq/L                   135-145   Potassium                 3.8 mEq/L                   3.5-5.1   Chloride                  104 mEq/L                   96-112   Carbon Dioxide            31 mEq/L                    19-32   Glucose                   97 mg/dL                    16-10   BUN                       7 mg/dL                     9-60   Creatinine                0.8 mg/dL                   4.5-4.0   Calcium                   8.8 mg/dL                   9.8-11.9   GFR                       96.48 mL/min                >60  Tests: (2) CBC Platelet w/Diff (CBCD)   White Cell Count          8.7 K/uL                    4.5-10.5   Red Cell Count       [L]  3.82 Mil/uL                 3.87-5.11   Hemoglobin           [L]  11.1 g/dL                   14.7-82.9   Hematocrit            [L]  33.2 %                      36.0-46.0   MCV                       86.9 fl                     78.0-100.0   MCHC                      33.6 g/dL                   56.2-13.0  RDW                  [H]  15.9 %                      11.5-14.6   Platelet Count            262.0 K/uL                  150.0-400.0   Neutrophil %              63.1 %                      43.0-77.0   Lymphocyte %              23.2 %                      12.0-46.0   Monocyte %                9.4 %                       3.0-12.0   Eosinophils%              3.3 %                       0.0-5.0   Basophils %               1.0 %                       0.0-3.0   Neutrophill Absolute      5.5 K/uL                    1.4-7.7   Lymphocyte Absolute       2.0 K/uL                    0.7-4.0   Monocyte Absolute         0.8 K/uL                    0.1-1.0  Eosinophils, Absolute                             0.3 K/uL                    0.0-0.7   Basophils Absolute        0.1 K/uL                    0.0-0.1  Tests: (3) Hepatic/Liver Function Panel (HEPATIC)   Total Bilirubin           0.5 mg/dL                   6.9-6.2   Direct Bilirubin          0.1 mg/dL                   9.5-2.8   Alkaline Phosphatase      96 U/L                      39-117   AST  21 U/L                      0-37   ALT                       19 U/L                      0-35   Total Protein             6.9 g/dL                    0.4-5.4   Albumin                   3.5 g/dL                    0.9-8.1  Tests: (4) TSH (TSH)   FastTSH                   1.80 uIU/mL                 0.35-5.50  Tests: (5) Lipid Panel (LIPID)   Cholesterol               183 mg/dL                   1-914     ATP III Classification            Desirable:  < 200 mg/dL                    Borderline High:  200 - 239 mg/dL               High:  > = 240 mg/dL   Triglycerides             54.0 mg/dL                  7.8-295.6     Normal:  <150 mg/dL     Borderline  High:  150 - 199 mg/dL   HDL                       21.30 mg/dL                 >86.57   VLDL Cholesterol          10.8 mg/dL                  8.4-69.6   LDL Cholesterol      [H]  295 mg/dL                   2-84  CHO/HDL Ratio:  CHD Risk                             4                    Men          Women     1/2 Average Risk     3.4          3.3     Average Risk          5.0          4.4     2X Average Risk  9.6          7.1     3X Average Risk          15.0          11.0                           Tests: (6) UDip Only (UDIP)   Color                     YELLOW       RANGE:  Yellow;Lt. Yellow   Clarity                   CLEAR                       Clear   Specific Gravity          >=1.030                     1.000 - 1.030   Urine Ph                  6.5                         5.0-8.0   Protein                   NEGATIVE                    Negative   Urine Glucose             NEGATIVE                    Negative   Ketones                   NEGATIVE                    Negative   Urine Bilirubin           NEGATIVE                    Negative   Blood                     NEGATIVE                    Negative   Urobilinogen              0.2                         0.0 - 1.0   Leukocyte Esterace        NEGATIVE                    Negative   Nitrite                   NEGATIVE                    Negative Prescriptions: GABAPENTIN 100 MG  CAPS (GABAPENTIN) 1 by mouth once daily for cramps  #30 x 12   Entered and Authorized by:   Jacques Navy MD   Signed by:   Jacques Navy MD on 09/17/2009   Method used:   [facility]  [address] [id number]       [address]       [place], [place]  [address]       Ph: [phone] or [phone]       Fax: [phone]   RxID:   [id number] PROTONIX 40 MG  PACK (PANTOPRAZOLE SODIUM) every AM  #30 x 12   Entered and Authorized by:   Jacques Navy MD   Signed by:   Jacques Navy MD on  09/17/2009   Method used:   [facility]  [address] [id number]       [address]       [place], [place]  [address]       Ph: [phone] or [phone]       Fax: [phone]   RxID:   [id number]    Preventive Care Screening  Pap Smear:    Date:  09/08/2009    Results:  normal     Not Administered:    Influenza Vaccine # 1 not given due to: declined

## 2010-08-04 ENCOUNTER — Encounter: Payer: Self-pay | Admitting: Internal Medicine

## 2010-08-25 ENCOUNTER — Encounter: Payer: Self-pay | Admitting: Internal Medicine

## 2010-08-26 NOTE — Letter (Signed)
Summary: Velna Ochs MD  Velna Ochs MD   Imported By: Lester Lebanon 08/20/2010 09:39:11  _____________________________________________________________________  External Attachment:    Type:   Image     Comment:   External Document

## 2010-09-16 NOTE — Letter (Signed)
Summary: [facility]  [facility]   Imported By: Lennie Odor 09/06/2010 15:36:43  _____________________________________________________________________  External Attachment:    Type:   Image     Comment:   External Document

## 2010-09-30 ENCOUNTER — Ambulatory Visit (INDEPENDENT_AMBULATORY_CARE_PROVIDER_SITE_OTHER): Payer: 59 | Admitting: Internal Medicine

## 2010-09-30 ENCOUNTER — Encounter: Payer: Self-pay | Admitting: Internal Medicine

## 2010-09-30 ENCOUNTER — Other Ambulatory Visit (INDEPENDENT_AMBULATORY_CARE_PROVIDER_SITE_OTHER): Payer: 59

## 2010-09-30 VITALS — BP 138/76 | HR 86 | Temp 98.3°F | Ht 66.0 in | Wt 198.0 lb

## 2010-09-30 DIAGNOSIS — I10 Essential (primary) hypertension: Secondary | ICD-10-CM

## 2010-09-30 DIAGNOSIS — K279 Peptic ulcer, site unspecified, unspecified as acute or chronic, without hemorrhage or perforation: Secondary | ICD-10-CM

## 2010-09-30 DIAGNOSIS — Z136 Encounter for screening for cardiovascular disorders: Secondary | ICD-10-CM

## 2010-09-30 DIAGNOSIS — D509 Iron deficiency anemia, unspecified: Secondary | ICD-10-CM

## 2010-09-30 DIAGNOSIS — Z0000 Encounter for general adult medical examination without abnormal findings: Secondary | ICD-10-CM

## 2010-09-30 LAB — IBC PANEL
Iron: 31 ug/dL — ABNORMAL LOW (ref 42–145)
Saturation Ratios: 8.6 % — ABNORMAL LOW (ref 20.0–50.0)
Transferrin: 258.2 mg/dL (ref 212.0–360.0)

## 2010-09-30 LAB — CBC WITH DIFFERENTIAL/PLATELET
Basophils Relative: 0.8 % (ref 0.0–3.0)
Eosinophils Absolute: 0.5 10*3/uL (ref 0.0–0.7)
Eosinophils Relative: 4.6 % (ref 0.0–5.0)
Hemoglobin: 9.9 g/dL — ABNORMAL LOW (ref 12.0–15.0)
Lymphocytes Relative: 27.4 % (ref 12.0–46.0)
MCHC: 33.4 g/dL (ref 30.0–36.0)
MCV: 81.9 fl (ref 78.0–100.0)
Monocytes Absolute: 0.9 10*3/uL (ref 0.1–1.0)
Neutro Abs: 6.2 10*3/uL (ref 1.4–7.7)
Neutrophils Relative %: 58.7 % (ref 43.0–77.0)
RBC: 3.61 Mil/uL — ABNORMAL LOW (ref 3.87–5.11)
WBC: 10.6 10*3/uL — ABNORMAL HIGH (ref 4.5–10.5)

## 2010-09-30 LAB — COMPREHENSIVE METABOLIC PANEL
AST: 19 U/L (ref 0–37)
Albumin: 3.6 g/dL (ref 3.5–5.2)
BUN: 13 mg/dL (ref 6–23)
Calcium: 8.6 mg/dL (ref 8.4–10.5)
Chloride: 101 mEq/L (ref 96–112)
Creatinine, Ser: 0.9 mg/dL (ref 0.4–1.2)
Glucose, Bld: 89 mg/dL (ref 70–99)
Potassium: 3.4 mEq/L — ABNORMAL LOW (ref 3.5–5.1)

## 2010-09-30 MED ORDER — BENZONATATE 100 MG PO CAPS: 100.0000 mg | ORAL_CAPSULE | Freq: Three times a day (TID) | ORAL | Status: DC | PRN

## 2010-09-30 MED ORDER — GABAPENTIN 100 MG PO CAPS: 100.0000 mg | ORAL_CAPSULE | Freq: Every day | ORAL | Status: DC | PRN

## 2010-09-30 NOTE — Progress Notes (Signed)
Subjective:    Patient ID: Jaime [name], female    DOB: 1955-09-03, 55 y.o.   MRN: [id number]  HPI Jaime [name] presents for general medical exam. She has been doing well except for a persistent cough for the past 3 months: she describes a non-productive cough that feels like there is something in her throat. She is off ACE-I, she takes PPI. She has not had any SOB, DOE, no enlarged lymph nodes, weight loss. She does have night sweats which she attributes to menopause.   She is due to see Dr. Stefano [name] for pelvic/pap and breast exam. She had a mammogram in '11 and is waiting for card from [facility] hospital. She had colonoscopy '09.   Past Medical History  Diagnosis Date  . PVD (peripheral vascular disease)   . Migraine   . Meningioma   . 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 Nontoxic multinodular goiter   . Peptic ulcer disease    Past Surgical History  Procedure Date  . Tubal ligation   . Tonsillectomy 1965  . Ovarian cyst removal   . Orif ankle fracture     right   Family History  Problem Relation Age of Onset  . Hypertension Mother   . Diabetes Mother   . Esophageal cancer Father   . Breast cancer Neg Hx   . Coronary artery disease Neg Hx   . Colon cancer Other    History   Social History  . Marital Status: Married    Spouse Name: N/A    Number of Children: N/A  . Years of Education: N/A   Occupational History  . Computer operator  [employer]   Social History Main Topics  . Smoking status: Not on file  . Smokeless tobacco: Not on file  . Alcohol Use:   . Drug Use:   . Sexually Active:    Other Topics Concern  . Not on file   Social History Narrative   MD Roster:Opthal - [place], Dr Daphine Deutscher at [facility] surgeon - [facility], 2 years at [employer] [facility] 1989Daughter - 1979, 2 step-sons2 grand-children      Review of Systems Review of Systems  Constitutional:  Negative for fever, chills, activity change  and unexpected weight change.  HENT:  Negative for hearing loss, ear pain, congestion, neck stiffness and postnasal drip.   Eyes: Negative for pain, discharge and visual disturbance.  Respiratory: Negative for chest tightness and wheezing.   Cardiovascular: Negative for chest pain and palpitations.       [No decreased exercise tolerance Gastrointestinal: [No change in bowel habit. No bloating or gas. No reflux or indigestion Genitourinary: Negative for urgency, frequency, flank pain and difficulty urinating.  Musculoskeletal: Negative for myalgias, back pain, arthralgias and gait problem.  Neurological: Negative for dizziness, tremors, weakness and headaches.  Hematological: Negative for adenopathy.  Psychiatric/Behavioral: Negative for behavioral problems and dysphoric mood.       Objective:   Physical Exam Physical Exam  Constitutional: She is oriented to person, place, and time. Vital signs are normal. She appears well-developed and well-nourished.       pleasant AA woman in no distress  HENT:  Head: Normocephalic and atraumatic.  Right Ear: External ear normal.  Left Ear: External ear normal.       EACs and TMs normal  Eyes: Conjunctivae and EOM are normal. Pupils are equal, round, and reactive to light. No scleral icterus.  Neck: Normal range of motion. Neck supple. No  JVD present. No thyromegaly present.  Cardiovascular: Normal rate, regular rhythm and normal heart sounds.  Exam reveals no friction rub.   No murmur heard.      Radial and Dorsalis Pedis pulses normal  Pulmonary/Chest: Effort normal and breath sounds normal. No respiratory distress. She has no wheezes. She has no rales.       No chest wall deformity with normal A-P diameter. Breast exam: deferred to Dr. Stefano [name]  Abdominal: Soft. Bowel sounds are normal. She exhibits no distension and no mass. There is no tenderness. There is no guarding.       No hepatosplenomegaly  Genitourinary:       Exam deferred to  [facility]  Musculoskeletal: Normal range of motion. She exhibits no edema and no tenderness.       No joint swelling, no synovial thickening, no deformity of the small, medium or large joints except for right 5th digit in a brace  Lymphadenopathy:    She has no cervical adenopathy.  Neurological: She is alert and oriented to person, place, and time. She has normal reflexes. No cranial nerve deficit. Coordination normal.       Normal facial symmetry, normal gross motor strength throughout, no tremor or cogwheeling, normal rapid finger movement.  Skin: Skin is warm and dry. No rash noted. No erythema.       No suspicious lesions. Normal turgor. Nails are normal.  Psychiatric: She has a normal mood and affect. Her behavior is normal. Thought content normal.       Normal recall        Assessment & Plan:  1. High blood pressure - adequate control on present medications.  Plan - continue meds           Routine lab  2. Anemia - will check blood counts and iron levels  Plan - recommendations to follow with lab results  3. Goiter - will check thyroid   4. Health maintenance- unremarkable history except for cough. PHysical exam is ok. Please make your appointment with Dr. Stefano [name]. Current with colonoscopy. Immunizations are up to date.   In summary - a very nice woman who appears medically stable who needs follow-up of iron deficiency anemia.

## 2010-10-28 ENCOUNTER — Other Ambulatory Visit: Payer: Self-pay | Admitting: Internal Medicine

## 2010-11-02 NOTE — Op Note (Signed)
NAMEKATARYNA, Jaime [name]              ACCOUNT NO.:  [id number]   MEDICAL RECORD NO.:  [id number]          PATIENT TYPE:  AMB   LOCATION:  DSC                          FACILITY:  [facility]   PHYSICIAN:  Harvie Junior, M.D.   DATE OF BIRTH:  [birth year]   DATE OF PROCEDURE:  06/29/2007  DATE OF DISCHARGE:                               OPERATIVE REPORT   PREOPERATIVE DIAGNOSIS:  1. Small mass right foot, painful.  2. Retained foreign body of an incision in the right foot.   POSTOPERATIVE DIAGNOSIS:  1. Small mass right foot, painful.  2. Retained foreign body of an incision in the right foot.   PROCEDURE:  1. Excision of mass right foot with corresponding debridement of      metatarsal bone of the fourth and fifth metatarsals.  2. Excision of foreign body from a previous wound in the right ankle.   SURGEON:  Harvie Junior, M.D.   ASSISTANT:  Marshia Ly, P.A.   ANESTHESIA:  General.   BRIEF HISTORY:  [name] is a 55 year old female with a long history  of having had a significant painful mass at essentially the base of her  fourth metatarsal anteriorly.  She had been treated conservatively for a  period of time.  Because of continued complaints of pain and failure to  conservative care she ultimately was taken to the operating room for  excision of mass.  She also was noted to have what appeared to be a  retained stitch up over the old area where we did an ankle arthroscopy  on her and we wanted to explore that while she was asleep.   PROCEDURE IN DETAIL:  The patient was taken to the operating room.  Once  adequate anesthesia was obtained with general anesthetic the patient was  placed on the operating table.  The right foot was prepped and draped in  the usual sterile fashion.  Following this the leg was prepped and  draped in the usual sterile fashion.  The leg was exsanguinated for 3  minutes and then blood pressure tourniquet was inflated to 250 mmHg.  Following this the  wound over her old ankle arthroscopy was explored and  we did find a nylon stitch in this area.  The stitch was removed.  It  was sort of keratinous skin and sort of heaped up skin and so we felt  that the most perfect course of action at this point was to excise this  heaped up portion of skin after the foreign body had been removed and  then close it and this was accomplished.  Following this a small  incision was made over the mass distally, what did appear to be a  ganglion cyst.  This was excised in total from the base of the fourth  and fifth metatarsals anteriorly and then some bone was rongeured away  from the base of the fourth and fifth metatarsals anteriorly.  At this  point  the knee was then copiously irrigated and thoroughly irrigated and  suctioned dry.  The arthroscopic portals were closed with bandage and  sterile compressive dressing was applied and the patient was taken to  recovery where she was noted to be in satisfactory condition.  Estimated  blood loss for the procedure was minimal.      Harvie Junior, M.D.  Electronically Signed     JLG/MEDQ  D:  06/29/2007  T:  06/29/2007  Job:  [id number]

## 2010-11-05 NOTE — Op Note (Signed)
NAMEMADDELINE, Jaime [name]              ACCOUNT NO.:  [id number]   MEDICAL RECORD NO.:  [id number]          PATIENT TYPE:  INP   LOCATION:  0482                         FACILITY:  [facility]   PHYSICIAN:  Harvie Junior, M.D.   DATE OF BIRTH:  [birth year]   DATE OF PROCEDURE:  DATE OF DISCHARGE:                                 OPERATIVE REPORT   NOTE:  This procedure was done at [facility].   PREOPERATIVE DIAGNOSIS:   POSTOPERATIVE DIAGNOSIS:   PROCEDURE:   SURGEON:  Harvie Junior, M.D.   ANESTHESIA:  General.   INDICATIONS FOR PROCEDURE:  This is a 55 year old female who fell outside of  her home.  She ultimately suffered a fracture dislocation of the ankle. She  was seen in the [facility] Emergency Department, I was over at  [facility], and was consulted for treatment of the injury.  We ultimately asked  the emergency room physician to do a reduction. They were unable to reduce  the dislocated portion, and we were thus compelled to do open reduction  internal fixation on her.   We discussed treatment options with a bimalleolar, bad fracture dislocation.  Operative intervention was the only appropriate course of action. The  patient was brought to the operating room for that procedure.   DESCRIPTION OF PROCEDURE:  The patient was brought to the operating room and  after adequate anesthesia was obtained with general anesthesia, patient  placed supine on the operating table.  The right leg was prepped and draped  in the usual sterile fashion. Following this, lateral incision was made,  subcutaneous tissue reflected to the level of the lateral malleolus  fracture, some comminuted fractures were identified.  This was held  essentially anatomically reduced.   Attention was then turned medially, where the medial malleolus fragment was  identified.  After incision medially and exposure of the fragment, two 50 mm  malleolar screws were used to get anatomic reduction.   Laterally, we  achieved anatomic reduction with interfragmentary screw and a 6-hole plate  contoured to the ankle.  Fluoroscopy was used throughout the case. The  posterior malleolus was identified, and there was kind of a crush component  to the posterior malleolus, but no fragment that looked big enough to put  any kind of screw in; and certainly, it was all less than 20% of the joint  surface.   At this point, the wounds were copiously irrigated and suctioned dry, closed  in layers.  Sterile, compressive dressing was applied as well as a  even, posterior split.  The patient was admitted to the hospital for  postoperative PT and monitoring.      JLG/MEDQ  D:  06/26/2004  T:  06/26/2004  Job:  [id number]

## 2010-11-05 NOTE — Assessment & Plan Note (Signed)
[facility]                               PULMONARY OFFICE NOTE   Jaime [name], Jaime [name]                     MRN:          [id number]  DATE:12/29/2005                            DOB:          1955-07-25    REQUESTING PHYSICIAN:  Dr. Everardo All   REASON FOR CONSULTATION:  Cough.   HISTORY:  A 55 year old black female with acute onset of cough after  arriving on a bus back from gambling in a casino where she was exposed to  heavy smoke.  The cough has been persistent since that time.  Seems to be  worse with walking, but also occurs at night and is typically dry but  occasionally she coughs so hard she vomits and also has trouble with urinary  incontinence.  She does have history of asthma at age 39, but outgrew it and  states she has no significant dyspnea, nocturnal wheeze, fevers, chills,  sweats, overt reflux, or sinus complaints.  She has already been treated for  cyclical cough by Dr. Debby Bud which included a regimen for reflux and also  failed to respond to prednisone.   PAST MEDICAL HISTORY:  Significant for asthma diagnosed at age 27, chronic  headaches, and remote ankle surgery.  Significant for a left thyroid nodule  status post ultrasound-guided biopsy within the past week.   ALLERGIES:  None known.   MEDICATIONS:  Protonix 40 mg when she remembers to take it.   SOCIAL HISTORY:  She quit smoking 25 years ago.  She works as a Estate manager/land agent.   FAMILY HISTORY:  Significant for the absence of sarcoid, rheumatologic  disease, asthma, or atopy.   REVIEW OF SYSTEMS:  Taken in detail on the worksheet- none except for  problems outlined above.   PHYSICAL EXAMINATION:  GENERAL:  This is a pleasant, ambulatory black female  in no acute distress.  VITAL SIGNS:  Stable vital signs.  HEENT:  Unremarkable.  Oropharynx is clear.  NECK:  Without cervical adenopathy or tenderness.  Trachea is midline.  No  thyromegaly.  LUNGS:  Lung fields  perfectly clear bilaterally to auscultation and  percussion.  CARDIAC:  There is regular rate and rhythm without murmurs, rubs, or gallops  present.  ABDOMEN:  Soft, benign.  EXTREMITIES:  Warm without calf tenderness, clubbing, cyanosis, edema.   LABORATORY DATA:  Chest and head CT scan was reviewed from December 07, 2005 and  reveal evidence of a right cavernous sinus enhancing lesion consistent  with a meningioma.  No mention of sinusitis on the report.  Chest CT scan  report was reviewed from December 08, 2005, indicates mildly enlarged bilateral  hilar mediastinal nodes.  She also has a hiatal hernia on CT scan.   IMPRESSION:  1.  Acute onset of cough associated with vomiting after exposure to crowds      and cigarette smoke.  The differential diagnosis should include      Bordetella exposure but treatment now directed at Bordetella, even if      she had it, is very unlikely to be helpful.  Most likely Dr.  Norins is      correct that the cough is cyclical in nature and that she coughs so much      and clears her throat that she develops secondary regurgitation and      urinary incontinence.  Of the two, of course, regurgitation is going to      perpetuate more coughing where as urinary incontinence is simply a      nuisance.   Therefore, I recommended treating her aggressively for reflux by asking her  to increase her PPI therapy not to when she thinks about it, but perfectly  regular b.i.d. for two weeks along with Reglan 10 mg before meals at bedtime  and dietary modifications that I reviewed with her in writing in detail.  I  will give her another six-day course of prednisone and will see her back in  two weeks to see how she is doing.  If the cough continues the next step in  the work-up would probably be to do a methacholine challenge test and  consider bronchoscopy early on in this patient with a question of hilar  adenopathy.  1.  Bilateral hilar mediastinal adenopathy seen only  on CT scan and not on      plain film.  It is probably of no consequence.  I suppose she could have      had sarcoid at one point but this would be a very unusual presentation.      The work-up for this degree of mediastinoscopy is not clear because the      lymph nodes are not of a pathologic order of size and, of course,      there is no clinical need at this point to arrive at a histologic      diagnosis in a patient who has such minimal symptoms and minimal      adenopathy.  However, we do need to keep this in mind for future      reference, especially if there appears to be any gross evidence of      worsening adenopathy by plain film.  For now, the benefits are      outweighed by the risks and discomfort of the procedure.                                   Jaime [name]. Jaime Sires, MD, [facility]   MBW/MedQ  DD:  12/30/2005  DT:  12/30/2005  Job #:  [id number]   cc:   Jaime [name]. Norins, MD

## 2010-11-05 NOTE — Assessment & Plan Note (Signed)
[facility]                               PULMONARY OFFICE NOTE   Jaime [name], Jaime [name]                     MRN:          [id number]  DATE:03/10/2006                            DOB:          1955-10-17    PULMONARY/FOLLOWUP OFFICE VISIT:   HISTORY:  This is a 55 year old female with a chronic cough dating back to  March of 2007, which was so severe she coughed so hard she vomited.  The  work-up included a CT scan that showed bilateral hilar adenopathy, and she  comes back today all smiles with cough completely resolved, having  empirically treated her with Protonix b.i.d.  She denies any fevers, chills,  sweats, nausea, weight loss or ocular complaints.   PHYSICAL EXAMINATION:  GENERAL:  She is a pleasant, ambulatory black female  in no acute distress.  VITAL SIGNS:  Stable.  HEENT:  Unremarkable.  Oropharynx clear.  LUNGS:  Lung fields perfectly clear bilaterally to auscultation and  percussion.  HEART:  Regular rhythm without murmur, gallop, or rub.  ABDOMEN:  Soft, benign.  EXTREMITIES:  Warm without calf tenderness, cyanosis or clubbing.   Her plain film was reviewed from December 01, 2005 and is normal, as is today's  film.  I do see a suggestion of very mild hilar prominence, but certainly  nothing that looks pathologic.   IMPRESSION:  Cough clearly was reflux in that she coughed so hard she  vomited.  It is difficult to know in situations like this whether the  initial cough had anything to do with reflux or rather, the more likely  scenario in that, is that she had a mild illness that exacerbated a mild  chronic reflux issue.  To test this hypothesis, I recommended she reduce the  Protonix down to one daily.  However, I do not think that her cough has  anything to with the CT scan findings which show an incidental adenopathy of  a non-specific nature.  She very well could have burned out sarcoid, but  certainly does not appear to have  anything more serious than that such as  lymphoma.   I would like to see her back in 3 months for a final chest x-ray to assure  stability of the adenopathy and certainly see her back sooner if her cough  is not controlled on Protonix b.i.d.  If the cough exacerbates every time  the Protonix is reduced down to 1 daily, then I would consider having Dr.  Arlyce Dice see her again, since he is her GI physician of record.                                  Charlaine Dalton. Sherene Sires, MD, [facility]   MBW/MedQ  DD:  03/10/2006  DT:  03/13/2006  Job #:  [id number]   cc:   Rosalyn Gess. Norins, MD

## 2010-11-05 NOTE — Op Note (Signed)
[place]  Patient:    Jaime [name], Jaime [name]                     MRN: [id number] Proc. Date: 05/16/00 Adm. Date:  [id number] Attending:  Marcene Corning                           Operative Report  PREOPERATIVE DIAGNOSES: 1. Left knee chondromalacia patella. 2. Left knee subluxation patella.  POSTOPERATIVE DIAGNOSES: 1. Left knee chondromalacia patella. 2. Left knee subluxation patella. 3. Left knee lose bodies.  PROCEDURE: 1. Left knee chondroplasty patella. 2. Left knee arthroscopic lateral release. 3. Left knee removal lose bodies x 2.  ANESTHESIA:  General.  SURGEON:  Lubertha Basque. Jerl Santos, M.D.  ASSISTANT:  Prince Rome, P.A.  INDICATIONS FOR PROCEDURE:  The patient is a 55 year old woman who is many months out from a left knee arthroplasty at which point a chondroplasty of the patella was performed.  She has persisted with anterior pain and a catch inside her knee though she initially did well.  At this point she is offered a repeat arthroscopy with plans to perform a lateral release.  The procedures were discussed with the patient and informed operative consent was obtained after discussion of the possible complications of reaction to anesthesia and infection.  DESCRIPTION OF PROCEDURE:  The patient was taken to the operating suite where general anesthetic was applied without difficulty.  She was positioned supine and prepped and draped in a normal sterile fashion.  After administration of preoperative IV antibiotics an arthroscopy of the left knee was performed through a total of 3 portals.  Suprapatellar pouch was benign while the patellofemoral joint exhibited some grade 3 and 4 change in a small area of the medial patellar facet.  This was addressed with a thorough chondroplasty. In the medial compartment there was no evidence of meniscal or articular cartilage injury and the same was true of the lateral  compartment. Unfortunately there were two lose bodies about 5 mm in diameter which were in the knee.  These were cartilaginous in nature and were removed.  The patella did track in a lateral position and as a result a lateral release was performed through the additional third portal.  Once this was accomplished the knee cap tracked in a excellent position.  We used the cautery to control some bleeding through the lateral release site and the knee was thoroughly irrigated.  Marcaine with epinephrine and morphine were placed in the knee followed by Adaptic over the portals and dry gauze with a lose Ace wrap. Estimated blood loss and intraoperative fluids obtained from anesthesia records.  DISPOSITION:  The patient was extubated in the operating room and taken to recovery room in stable condition.  PLANS:  Plans were for her to go home on the same day and follow up in the office in less than a week.  I will contact her by phone tonight.            : DD:  05/16/00 TD:  05/16/00 Job: [id number]

## 2010-11-05 NOTE — Op Note (Signed)
[place]  Patient:    AIRIANA, Jaime [name]                     MRN: [id number] Proc. Date: 12/20/99 Adm. Date:  [id number] Attending:  Marcene Corning                           Operative Report  PREOPERATIVE DIAGNOSIS:  Left knee torn medial meniscus.  POSTOPERATIVE DIAGNOSES: 1. Left knee chondromalacia of patella. 2. Left knee loose bodies.  PROCEDURES: 1. Left knee chondroplasty of patella. 2. Left knee removal of loose bodies.  SURGEON:  Lubertha Basque. Jerl Santos, M.D.  ASSISTANT:  Prince Rome, P.A.  ANESTHESIA:  General.  INDICATIONS:  The patient is a 55 year old woman with two months of left knee and swelling.  This has persisted despite anti-inflammatories and activity restriction.  At this point, she is to undergo arthroscopy.  The procedure was discussed with the patient and informed operative consent was obtained after discussion of the possible complications of reaction to anesthesia and infection.  DESCRIPTION OF PROCEDURE:  The patient was taken to the operating suite where general anesthetic was applied without difficulty.  She was positioned supine and prepped and draped in the normal sterile fashion.  After the administration of preoperative antibiotics, an arthroscopy of the left knee was performed through two inferior portals.  The suprapatellar pouch was benign while the patellofemoral joint exhibited a flap tear on the medial aspect of the patella.  This was removed back to stable tissues.  The patella did track in a moderate lateral position, but no lateral release was added. She had some cartilaginous loose bodies in the lateral gutter which were removed.  The medial and lateral compartments exhibited no evidence of meniscal or articular cartilage injury.  ACL and PCL were intact and appeared normal.  The knee was thoroughly irrigated at the end of the case followed by placement of Marcaine with epinephrine and  morphine.  Adaptic was placed over the portals followed by dry gauze and a loose Ace wrap.  The estimated blood loss and intraoperative fluids can be obtained from the anesthesia records.  DISPOSITION:  The patient was extubated in the operating room and taken to the recovery room in stable condition.  Plans were for her to go home the same day and follow up in the office in less than a week.  I will contact her by phone tonight. DD:  12/20/99 TD:  12/21/99 Job: [id number]

## 2010-11-05 NOTE — Assessment & Plan Note (Signed)
[facility]                             PRIMARY CARE OFFICE NOTE   Jaime [name], Jaime [name]                       MRN:          [id number]  DATE:05/12/2006                            DOB:          10/16/1955    HISTORY:  [name] is a 55 year old African-American woman who presents  for follow-up evaluation and exam.  She was last seen in primary care by Dr.  Romero [name] on December 12, 2005.  I last saw the patient September 12, 2005.  Her  last physical exam was April 11, 2005.  In the interval since her last  visit with Dr. Everardo [name], she has been seen by Dr. Sherene [name] for evaluation of  cough.  This turned out to be fully resolved after being treated for GERD.  Please see Dr. Thurston [name] notes.   The patient has been working with [employer] and attended [facility] in [place], seen by Jaime [name], P.A.  The patient  reports no physical exam, lab tests, and EKGs were performed, but she was  started on Phentermine 37.5 mg daily.   The patient in the interval since her last full evaluation was found on CT  scan to have developed sinus infection and also subsequently to have a  cavernous sinus meningioma.  She was seen by Dr. Lovell [name] from neurosurgery,  who referred her to Dr. Andrey [name] at [facility] because the patient was  having visual problems with pressure on her optic nerve.  The patient is now  scheduled for external beam radiation therapy to shrink the tumor.  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   HEALTH MAINTENANCE:  The patient has been seen by Dr. Stefano [name] and is up to  date for GYN exam and mammogram.   PAST MEDICAL HISTORY:  1. Usual childhood diseases.  2. Diverticulitis.  3. Peptic ulcer disease.  4. Migraine.  5. UTI.  6. GERD with cough.  7. Meningioma as noted   PAST SURGICAL HISTORY:  1. Tonsillectomy, 1965.  2. BTL.  3. Ovarian cystectomy.  4. Arthroscopy on the  left x2.  5. ORIF, right ankle.   CURRENT MEDICATIONS:  1. Protonix 40 mg daily.  2. Aspirin 325 mg daily.  3. Phentermine 37.5 mg daily which she has not yet started.  4. Methocarbamol 500 mg p.r.n.  5. Temazepam 15 mg q.h.s. p.r.n.  6. Tussionex p.r.n.   REVIEW OF SYSTEMS:  Negative for any constitutional problems.  Ophthalmology  per the HPI with optic nerve pressure with visual change which is now  cleared.  No cardiovascular, respiratory, GI, or GU complaints, except as  documented above.   PHYSICAL EXAMINATION:  VITAL SIGNS:  Temperature was 97, blood pressure  132/80, pulse 87, weight 181.  GENERAL APPEARANCE:  This is a heavy set, mildly overweight, African-  American woman in no acute distress.  HEENT:  Normocephalic, atraumatic.  EACs and TMs were normal.  Oropharynx  with native dentition with no buccal or palatal lesions noted.  Posterior  pharynx was clear.  Conjunctivae and  sclerae were clear.  NECK:  Supple without thyromegaly.  NODES:  No adenopathy was noted in the cervical or supraclavicular regions.  The patient is noted to have an enlarged thyroid but no nodules palpable.  LUNGS:  Clear with no rales, wheezes, or rhonchi.  CARDIOVASCULAR:  2+ radial pulses with no JVD or carotid bruits.  She had a  regular rate and rhythm without murmurs, rubs, or gallops.  BREASTS:  Exam deferred to gynecology.  ABDOMEN:  Soft.  No guarding or rebound.  No organomegaly or splenomegaly  was noted.  PELVIC/RECTAL:  Exams deferred to gynecology.  EXTREMITIES:  Without clubbing, cyanosis, edema, or deformity.  NEUROLOGIC:  Exam was grossly nonfocal with normal extraocular muscles.  Funduscopic exam with hand held instrument was unremarkable.  DTRs were  normal.  Motor strength was normal.  Cerebellar function was normal.  SKIN:  Clear with no suspicious lesions.   LABORATORY DATA:  Hemoglobin 10.8 g down from 11.2 g previously.  White  count was 8800 with a normal  differential, platelet count 255,000.  Chemistries:  Serum glucose 104, electrolytes were normal.  BUN 11,  creatinine 0.8.  Liver functions normal.  GFR 98 ml/minute.  TSH was normal  at 1.83.  Urinalysis was negative.  Cholesterol was 166, triglycerides 41,  HDL 44.9, LDL was 113.   E-chart was reviewed.  The patient had a 2-D echo performed April 19, 2006  with overall summary indicating a normal left ventricular function with an  EF of 60% with no wall motion abnormalities.  There was mild mitral valvular  regurgitation.  There was no echocardiographic evidence or cardiac source of  embolism.   The patient had MRI of the brain performed December 20, 2005 which revealed a  right cavernous sinus lesion suggestive of meningioma with adjacent local  mass effect as described, including compression  of the right internal  carotid artery.  There was compression of the pituitary gland.  No acute  thrombotic infarct was noted.   ASSESSMENT/PLAN:  1. Neurosurgery.  Patient with cavernous sinus meningioma is noted.  She      is about to begin radiation therapy.  Evidently this is not a surgical      situation for her.  2. Cough, resolved.  3. Reflux.  Patient to continue on Protonix.  4. Weight management.  I have encouraged the patient to follow a lifestyle      change, weight loss program with reduction in portion size, calorie      restriction, and regular exercise.  I would advise against Phentermine.  5. Health maintenance.  The patient is current and up to date with her      gynecologist.  Last colorectal cancer screening was March 25, 2003      with diverticulosis.  She would be a candidate for follow-up in 2009.      Last EGD on January 14, 2003 was a normal study.   The patient had a chest x-ray March 10, 2006 which was read out as  showing shotty nodular right infrahilar region, which may represent overlapping vasculature.  This was also reviewed by Dr. Sherene [name].  Last EKG from   April 11, 2005 was normal.   SUBJECTIVE:  This is a patient with problems outlined above.  She does seem  medically stable at this time.  I would be happy to see her back on an as  needed basis or in six months for routine follow-up.     Casimiro Needle  Esther Hardy, MD  Electronically Signed    MEN/MedQ  DD: 05/13/2006  DT: 05/13/2006  Job #: [id number]   cc:   Steffanie Rainwater, Ms.  Mayra Neer. Jaime [name], M.D.

## 2010-11-05 NOTE — Assessment & Plan Note (Signed)
[facility]                               PULMONARY OFFICE NOTE   LINCOLN, KLEINER                     MRN:          [id number]  DATE:01/12/2006                            DOB:          [birth year]    HISTORY:  A 55 year old black female with chronic cough and evidence by CT  scan of the chest of questionably new adenopathy and a hiatal hernia. I  presume that the cough is probably reflux related and doubted that there was  any significance to the non-specific adenopathy.  I treated her aggressively  with high dose PPI and metoclopramide and she says the cough is almost  completely gone.  She did receive Tramadol to use p.r.n. but says she has  only used it once.  She denies any ocular tic or complaints of fevers,  chills, sweats, orthopnea, PND or leg swelling, or unintended weight loss.   PHYSICAL EXAMINATION:  She is a robust, ambulatory, pleasant black female in  no acute distress.  VITAL SIGNS:  Normal.  HEENT:  Remarkable for the absence of subcarinal adenopathy or tenderness.  Trachea is midline.  No mass or megaly.  LUNGS:  Fields perfectly clear bilaterally to auscultation and percussion.  HEART:  Regular rhythm without murmur, gallop, or rub.  ABDOMEN:  Soft, benign.  EXTREMITIES:  Without calf tenderness, cyanosis, or clubbing.   IMPRESSION:  1.  Almost complete revolution of cough by treating the reflux aggressively.      Once the cough is 100% resolved, therefore it would be reasonable to try      to taper her medications down to maintenance therapy with 1 Protonix      daily emphasizing the dietary modifications is important as well and      reviewing the instructions with her in detail today.  2.  In terms of the adenopathy, unless it is present by chest x-ray or her      symptoms of  cough are refractory I would not pursue a tissue      diagnosis.  I did recommend a follow up chest x-ray at a three-month      interval which  will come due September 21st.  Will see her sooner if      needed.                                   Charlaine Dalton. Sherene Sires, MD, [facility]   MBW/MedQ  DD:  01/12/2006  DT:  01/12/2006  Job #:  [id number]   cc:   Gregary Signs A. Everardo All, MD

## 2010-11-05 NOTE — Op Note (Signed)
Jaime [name], Jaime [name]              ACCOUNT NO.:  [id number]   MEDICAL RECORD NO.:  [id number]          PATIENT TYPE:  AMB   LOCATION:  DSC                          FACILITY:  [facility]   PHYSICIAN:  Harvie Junior, M.D.   DATE OF BIRTH:  03-25-1956   DATE OF PROCEDURE:  12/06/2004  DATE OF DISCHARGE:                                 OPERATIVE REPORT   PREOPERATIVE DIAGNOSIS:  Status post ankle fracture dislocation with painful  ankle, improvement with injection therapy.   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   PROCEDURE:  1.  Massive synovectomy with anterior capsular release, arthroscopic.  2.  Debridement of lateral osteochondral defect.  3.  Debridement of medial osteochondral defect.   SURGEON:  Harvie Junior, M.D.   ASSISTANT:  Marshia Ly, P.A.   ANESTHESIA:  General.   BRIEF HISTORY:  55 year old female with a long history of having fracture  dislocation of the right ankle.  She ultimately was treated with open  reduction and internal fixation which she did well from except that she had  unbelievable stiffness and was treated with early aggressive therapy and she  just was not able to get moving.  Because of this significant issue with  immobility, it was felt that arthroscopic evaluation manipulation may be  appropriate and we injected her ankle.  She had great pain relief with that  and we felt that arthroscopic evaluation was helpful and we would debride as  necessary.   DESCRIPTION OF PROCEDURE:  The patient was taken to the operating room and  after adequate anesthesia was obtained with general anesthetic, the patient  was placed on the operating table, the right leg was prepped and draped in  the usual sterile fashion.  Following this, routine arthroscopic examination  of the ankle revealed there was obvious significant synovitis of the ankle.  There was dramatic scar tissue in both the medial  and lateral gutters as  well as anteriorly and after exsanguination of the extremity and blood  pressure cuff being inflated and beginning of arthroscopy, we debrided this  anterior scar tissue at length.  I also went into the medial gutter and used  a probe to clean out the medial gutter, went into the lateral gutter and  used a probe to clean that out.  I was able to get posteriorly in the ankle  to see and the articular cartilage looked quite good.  There was no  significant evidence posteriorly of articular cartilage and I really could  not get farther in the back and did not feel necessarily that a release was  appropriate given that it was not able to be visualized.  At that point, it  was felt that the most appropriate course of action was going to be  debridement which was undertaken, manipulation, and distraction.  We held  the patient distracted for about 40 minutes with aggressive ankle  distraction and debrided this out.  Ultimately, once this debridement was  undertaken, we closed the portals deep with 4-0 Vicryl interrupted suture,  closed  the skin with  nylon interrupted suture.  A sterile compressive dressing was applied.  We  placed a Cam walker to begin early motion and early therapy.  We will see  her back in the office in a few days for recheck.  Estimated blood loss for  the procedure was none.  Complications were none.       JLG/MEDQ  D:  12/06/2004  T:  12/06/2004  Job:  [id number]

## 2010-11-30 ENCOUNTER — Other Ambulatory Visit (HOSPITAL_COMMUNITY): Payer: Self-pay | Admitting: Obstetrics and Gynecology

## 2010-11-30 DIAGNOSIS — Z1231 Encounter for screening mammogram for malignant neoplasm of breast: Secondary | ICD-10-CM

## 2010-12-23 ENCOUNTER — Ambulatory Visit (HOSPITAL_COMMUNITY)
Admission: RE | Admit: 2010-12-23 | Discharge: 2010-12-23 | Disposition: A | Discharge status: Home / Self Care | Payer: 59 | Source: Ambulatory Visit | Attending: Obstetrics and Gynecology | Admitting: Obstetrics and Gynecology

## 2010-12-23 DIAGNOSIS — Z1231 Encounter for screening mammogram for malignant neoplasm of breast: Secondary | ICD-10-CM

## 2011-02-01 ENCOUNTER — Ambulatory Visit (INDEPENDENT_AMBULATORY_CARE_PROVIDER_SITE_OTHER): Payer: 59 | Admitting: Internal Medicine

## 2011-02-01 VITALS — BP 122/82 | HR 75 | Temp 98.0°F | Wt 193.0 lb

## 2011-02-01 DIAGNOSIS — R05 Cough: Secondary | ICD-10-CM

## 2011-02-01 DIAGNOSIS — R053 Chronic cough: Secondary | ICD-10-CM

## 2011-02-01 DIAGNOSIS — R059 Cough, unspecified: Secondary | ICD-10-CM

## 2011-02-03 ENCOUNTER — Other Ambulatory Visit (INDEPENDENT_AMBULATORY_CARE_PROVIDER_SITE_OTHER): Payer: 59

## 2011-02-03 ENCOUNTER — Encounter: Payer: Self-pay | Admitting: Internal Medicine

## 2011-02-03 DIAGNOSIS — R053 Chronic cough: Secondary | ICD-10-CM | POA: Insufficient documentation

## 2011-02-03 DIAGNOSIS — R05 Cough: Secondary | ICD-10-CM

## 2011-02-03 DIAGNOSIS — R059 Cough, unspecified: Secondary | ICD-10-CM

## 2011-02-03 LAB — BUN: BUN: 15 mg/dL (ref 6–23)

## 2011-02-03 NOTE — Progress Notes (Signed)
  Subjective:    Patient ID: Jaime [name], female    DOB: 1955/12/07, 55 y.o.   MRN: [id number]  HPI Ms. Choate presents for evaluation of a cough that she has had since January. IT is dry on-productive with no SOB, DOE. She feels like there is something in her throat. She will get transient relief with benzonatate. She has no limitation in her ADLs. She will have occasional wheezing. She has a remote history of smoking. She has had no weight loss, night sweats, no adenopathy.  On occasion she will take an extra dose of protonix for her heart burn.  Past Medical History  Diagnosis Date  . PVD (peripheral vascular disease)   . Migraine   . Meningioma   . 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 Nontoxic multinodular goiter   . Peptic ulcer disease    Past Surgical History  Procedure Date  . Tubal ligation   . Tonsillectomy 1965  . Ovarian cyst removal   . Orif ankle fracture     right   Family History  Problem Relation Age of Onset  . Hypertension Mother   . Diabetes Mother   . Esophageal cancer Father   . Breast cancer Neg Hx   . Coronary artery disease Neg Hx   . Colon cancer Other    History   Social History  . Marital Status: Married    Spouse Name: N/A    Number of Children: N/A  . Years of Education: N/A   Occupational History  . Computer operator  [employer]   Social History Main Topics  . Smoking status: Not on file  . Smokeless tobacco: Not on file  . Alcohol Use:   . Drug Use:   . Sexually Active:    Other Topics Concern  . Not on file   Social History Narrative   HSG-Pge, 2 years at [employer]  Married 1989 - marriage is ok.  Daughter - 1979, 2 step-sons; 2 grand-children. 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        Review of Systems Review of Systems  Constitutional:  Negative for fever, chills, activity change and unexpected weight  change.  HEENT:  Negative for hearing loss, ear pain, congestion, neck stiffness and postnasal drip. Negative for sore throat or swallowing problems. Negative for dental complaints.   Eyes: Negative for vision loss or change in visual acuity.  Respiratory: Negative for chest tightness. Positive for chronic cough   Cardiovascular: Negative for chest pain and palpitation. No decreased exercise tolerance Gastrointestinal: No change in bowel habit. No bloating or gas. Occasional break-through reflux or indigestion Genitourinary: Negative for urgency, frequency, flank pain and difficulty urinating.  Musculoskeletal: Negative for myalgias, back pain, arthralgias and gait problem.  Neurological: Negative for dizziness, tremors, weakness and headaches.  Hematological: Negative for adenopathy.  Psychiatric/Behavioral: Negative for behavioral problems and dysphoric mood.       Objective:   Physical Exam Vital reviewed - moderately overweight Gen'l - WNWD AA woman in no distress HEENT - normal Chest - CTAP, no rales or wheezes Cor - RRR       Assessment & Plan:

## 2011-02-03 NOTE — Assessment & Plan Note (Signed)
Persistent cough without evidence of infection. Reflux is a possibility despite the use of AM PPI therapy vs allergic rhinitis with post-nasal drip vs esophageal neoplasm vs endobronchial lesion.  Plan - Increase PPI to AM and HS, elevate HOB on 4" blocks           Continued use of antihistamines for drainage           CT chest with contrast           May come to pulmonary referral for FOB

## 2011-02-11 ENCOUNTER — Telehealth: Payer: Self-pay | Admitting: *Deleted

## 2011-02-11 NOTE — Telephone Encounter (Signed)
Pt left vm - has question about CT. Left mess to call office back.

## 2011-02-14 NOTE — Telephone Encounter (Signed)
Spoke w/pt - CT not scheduled yet, ordered 8/14 at [facility]. Informed patient that we would check w/PCC's first thing tomorrow am and they would call her w/status.

## 2011-02-16 ENCOUNTER — Ambulatory Visit (INDEPENDENT_AMBULATORY_CARE_PROVIDER_SITE_OTHER)
Admission: RE | Admit: 2011-02-16 | Discharge: 2011-02-16 | Disposition: A | Discharge status: Home / Self Care | Payer: 59 | Source: Ambulatory Visit | Attending: Internal Medicine | Admitting: Internal Medicine

## 2011-02-16 DIAGNOSIS — R059 Cough, unspecified: Secondary | ICD-10-CM

## 2011-02-16 DIAGNOSIS — R053 Chronic cough: Secondary | ICD-10-CM

## 2011-02-16 DIAGNOSIS — R05 Cough: Secondary | ICD-10-CM

## 2011-02-16 MED ORDER — IOHEXOL 300 MG/ML  SOLN
80.0000 mL | Freq: Once | INTRAMUSCULAR | Status: AC | PRN
  Administered 2011-02-16: 80 mL via INTRAVENOUS

## 2011-02-28 ENCOUNTER — Telehealth: Payer: Self-pay | Admitting: *Deleted

## 2011-02-28 NOTE — Telephone Encounter (Signed)
Patient requesting results of CT

## 2011-02-28 NOTE — Telephone Encounter (Signed)
No acute disease. Inflammtory nodes suggestive of sarcoid or other inflammatory process

## 2011-03-03 NOTE — Telephone Encounter (Signed)
Patient informed, She will try OTC cough med and call back w/no improvement in symptoms.

## 2011-05-05 ENCOUNTER — Other Ambulatory Visit: Payer: Self-pay | Admitting: Internal Medicine

## 2011-05-25 ENCOUNTER — Other Ambulatory Visit (INDEPENDENT_AMBULATORY_CARE_PROVIDER_SITE_OTHER): Payer: 59

## 2011-05-25 ENCOUNTER — Ambulatory Visit (INDEPENDENT_AMBULATORY_CARE_PROVIDER_SITE_OTHER): Payer: 59 | Admitting: Internal Medicine

## 2011-05-25 VITALS — BP 146/110 | HR 83 | Temp 98.2°F | Wt 194.0 lb

## 2011-05-25 DIAGNOSIS — K5289 Other specified noninfective gastroenteritis and colitis: Secondary | ICD-10-CM

## 2011-05-25 DIAGNOSIS — J40 Bronchitis, not specified as acute or chronic: Secondary | ICD-10-CM

## 2011-05-25 DIAGNOSIS — K529 Noninfective gastroenteritis and colitis, unspecified: Secondary | ICD-10-CM

## 2011-05-25 LAB — COMPREHENSIVE METABOLIC PANEL
AST: 17 U/L (ref 0–37)
Alkaline Phosphatase: 104 U/L (ref 39–117)
Glucose, Bld: 100 mg/dL — ABNORMAL HIGH (ref 70–99)
Potassium: 4.4 mEq/L (ref 3.5–5.1)
Sodium: 139 mEq/L (ref 135–145)
Total Bilirubin: 0.5 mg/dL (ref 0.3–1.2)
Total Protein: 7.4 g/dL (ref 6.0–8.3)

## 2011-05-25 LAB — CBC WITH DIFFERENTIAL/PLATELET
Basophils Absolute: 0.1 10*3/uL (ref 0.0–0.1)
Eosinophils Absolute: 0.2 10*3/uL (ref 0.0–0.7)
Eosinophils Relative: 1.8 % (ref 0.0–5.0)
HCT: 32.3 % — ABNORMAL LOW (ref 36.0–46.0)
Lymphs Abs: 2.4 10*3/uL (ref 0.7–4.0)
MCHC: 32.7 g/dL (ref 30.0–36.0)
MCV: 82.4 fl (ref 78.0–100.0)
Monocytes Absolute: 0.6 10*3/uL (ref 0.1–1.0)
Neutrophils Relative %: 67 % (ref 43.0–77.0)
Platelets: 338 10*3/uL (ref 150.0–400.0)
RDW: 19.5 % — ABNORMAL HIGH (ref 11.5–14.6)

## 2011-05-25 LAB — MAGNESIUM: Magnesium: 2.3 mg/dL (ref 1.5–2.5)

## 2011-05-25 MED ORDER — PROMETHAZINE-CODEINE 6.25-10 MG/5ML PO SYRP: 5.0000 mL | ORAL_SOLUTION | ORAL | Status: AC | PRN

## 2011-05-25 MED ORDER — ENALAPRIL MALEATE 10 MG PO TABS: ORAL_TABLET | ORAL | Status: DC

## 2011-05-25 MED ORDER — POTASSIUM CHLORIDE ER 10 MEQ PO TBCR: EXTENDED_RELEASE_TABLET | ORAL | Status: DC

## 2011-05-25 MED ORDER — SULFAMETHOXAZOLE-TRIMETHOPRIM 800-160 MG PO TABS: 1.0000 | ORAL_TABLET | Freq: Two times a day (BID) | ORAL | Status: AC

## 2011-05-25 MED ORDER — PROMETHAZINE HCL 12.5 MG PO TABS: 12.5000 mg | ORAL_TABLET | Freq: Four times a day (QID) | ORAL | Status: AC | PRN

## 2011-05-25 NOTE — Progress Notes (Signed)
  Subjective:    Patient ID: Jaime [name], female    DOB: [birth year], 55 y.o.   MRN: [id number]  HPI Jaime [name] presents with a 3 week h/o cough productive yellow thick mucus, no SOB, she has felt febrile but has not documented this. Sunday she developed Nausea and vomiting. She also had soft, stools that are hard to clean. She has been very fatigued and low on energy. She has been able to eat but it causes pain followed by loose stools.  She has had elevated BP: at the doctors, and eye doctors. Her eye doctor reported seeing retinal hemorrhage. She has not had epistaxis, double vision. She has mild headaches.   Past Medical History  Diagnosis Date  . PVD (peripheral vascular disease)   . Migraine   . Meningioma   . 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 Nontoxic multinodular goiter   . Peptic ulcer disease    Past Surgical History  Procedure Date  . Tubal ligation   . Tonsillectomy 1965  . Ovarian cyst removal   . Orif ankle fracture     right   Family History  Problem Relation Age of Onset  . Hypertension Mother   . Diabetes Mother   . Esophageal cancer Father   . Breast cancer Neg Hx   . Coronary artery disease Neg Hx   . Colon cancer Other    History   Social History  . Marital Status: Married    Spouse Name: N/A    Number of Children: N/A  . Years of Education: N/A   Occupational History  . Computer operator  [employer]   Social History Main Topics  . Smoking status: Former Games developer  . Smokeless tobacco: Never Used  . Alcohol Use: Not on file  . Drug Use: Not on file  . Sexually Active: Not on file   Other Topics Concern  . Not on file   Social History Narrative   HSG-Pge, 2 years at [employer]  Married 1989 - marriage is ok.  Daughter - 1979, 2 step-sons; 2 grand-children. 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        Review of Systems System  review is negative for any constitutional, cardiac, pulmonary, GI or neuro symptoms or complaints other than as described in the HPI.     Objective:   Physical Exam Vitals - afebrile, BP elevated 1`46/110 Gen'l - overweight AA woman who feels bad but is in distress HEENT- C&S clear Chest - coarse rhonchi, no wheezing Cor - RRR Abd-BS positive, no guarding or rebound.       Assessment & Plan:  Bronchitis -  Plan - Septra DS bid x 7           Prom/cod           APAP  Gastroenteritis - probable viral in origin. May be potassium depletion  Plan - promethazine           otc - citrus, etc.

## 2011-05-25 NOTE — Patient Instructions (Addendum)
Bronchitis - cough and sputum and abnormal breath sounds. Plan - promethazine with codeine cough syrup, septra DS (antibiotic) for 7 days.  Gastroenteritis - soft stools, nausea, vomiting. Plan - hydrate, citrus, po potassium 10 meq bid x 7 days.  Acute Bronchitis You have acute bronchitis. This means you have a chest cold. The airways in your lungs are red and sore (inflamed). Acute means it is sudden onset.   CAUSES Bronchitis is most often caused by the same virus that causes a cold. SYMPTOMS    Body aches.     Chest congestion.     Chills.    Cough.    Fever.    Shortness of breath.     Sore throat.  TREATMENT   Acute bronchitis is usually treated with rest, fluids, and medicines for relief of fever or cough. Most symptoms should go away after a few days or a week. Increased fluids may help thin your secretions and will prevent dehydration. Your caregiver may give you an inhaler to improve your symptoms. The inhaler reduces shortness of breath and helps control cough. You can take over-the-counter pain relievers or cough medicine to decrease coughing, pain, or fever. A cool-air vaporizer may help thin bronchial secretions and make it easier to clear your chest. Antibiotics are usually not needed but can be prescribed if you smoke, are seriously ill, have chronic lung problems, are elderly, or you are at higher risk for developing complications. Allergies and asthma can make bronchitis worse. Repeated episodes of bronchitis may cause longstanding lung problems. Avoid smoking and secondhand smoke. Exposure to cigarette smoke or irritating chemicals will make bronchitis worse. If you are a cigarette smoker, consider using nicotine gum or skin patches to help control withdrawal symptoms. Quitting smoking will help your lungs heal faster. Recovery from bronchitis is often slow, but you should start feeling better after 2 to 3 days. Cough from bronchitis frequently lasts for 3 to 4  weeks. To prevent another bout of acute bronchitis:  Quit smoking.     Wash your hands frequently to get rid of viruses or use a hand sanitizer.     Avoid other people with cold or virus symptoms.     Try not to touch your hands to your mouth, nose, or eyes.  SEEK IMMEDIATE MEDICAL CARE IF:  You develop increased fever, chills, or chest pain.     You have severe shortness of breath or bloody sputum.     You develop dehydration, fainting, repeated vomiting, or a severe headache.     You have no improvement after 1 week of treatment or you get worse.  MAKE SURE YOU:    Understand these instructions.     Will watch your condition.     Will get help right away if you are not doing well or get worse.  Document Released: 07/14/2004 Document Revised: 02/16/2011 Document Reviewed: 09/29/2010 [facility] Patient Information 2012 [place], [place].    Viral Gastroenteritis Viral gastroenteritis is also known as stomach flu. This condition affects the stomach and intestinal tract. The illness typically lasts 3 to 8 days. Most people develop an immune response. This eventually gets rid of the virus. While this natural response develops, the virus can make you quite ill.   CAUSES   Diarrhea and vomiting are often caused by a virus. Medicines (antibiotics) that kill germs will not help unless there is also a germ (bacterial) infection. SYMPTOMS   The most common symptom is diarrhea. This can cause severe loss  of fluids (dehydration) and body salt (electrolyte) imbalance. TREATMENT   Treatments for this illness are aimed at rehydration. Antidiarrheal medicines are not recommended. They do not decrease diarrhea volume and may be harmful. Usually, home treatment is all that is needed. The most serious cases involve vomiting so severely that you are not able to keep down fluids taken by mouth (orally). In these cases, intravenous (IV) fluids are needed. Vomiting with viral gastroenteritis is common,  but it will usually go away with treatment. HOME CARE INSTRUCTIONS   Small amounts of fluids should be taken frequently. Large amounts at one time may not be tolerated. Plain water may be harmful in infants and the elderly. Oral rehydration solutions (ORS) are available at pharmacies and grocery stores. ORS replace water and important electrolytes in proper proportions. Sports drinks are not as effective as ORS and may be harmful due to sugars worsening diarrhea.  As a general guideline for children, replace any new fluid losses from diarrhea or vomiting with ORS as follows:     If your child weighs 22 pounds or under (10 kg or less), give 60-120 mL (1/4 - 1/2 cup or 2 - 4 ounces) of ORS for each diarrheal stool or vomiting episode.     If your child weighs more than 22 pounds (more than 10 kgs), give 120-240 mL (1/2 - 1 cup or 4 - 8 ounces) of ORS for each diarrheal stool or vomiting episode.     In a child with vomiting, it may be helpful to give the above ORS replacement in 5 mL (1 teaspoon) amounts every 5 minutes, then increase as tolerated.     While correcting for dehydration, children should eat normally. However, foods high in sugar should be avoided because this may worsen diarrhea. Large amounts of carbonated soft drinks, juice, gelatin desserts, and other highly sugared drinks should be avoided.     After correction of dehydration, other liquids that are appealing to the child may be added. Children should drink small amounts of fluids frequently and fluids should be increased as tolerated.     Adults should eat normally while drinking more fluids than usual. Drink small amounts of fluids frequently and increase as tolerated. Drink enough water and fluids to keep your urine clear or pale yellow. Broths, weak decaffeinated tea, lemon-lime soft drinks (allowed to go flat), and ORS replace fluids and electrolytes.     Avoid:    Carbonated drinks.     Juice.    Extremely hot or cold  fluids.     Caffeine drinks.     Fatty, greasy foods.     Alcohol.    Tobacco.    Too much intake of anything at one time.     Gelatin desserts.     Probiotics are active cultures of beneficial bacteria. They may lessen the amount and number of diarrheal stools in adults. Probiotics can be found in yogurt with active cultures and in supplements.     Wash your hands well to avoid spreading bacteria and viruses.     Antidiarrheal medicines are not recommended for infants and children.     Only take over-the-counter or prescription medicines for pain, discomfort, or fever as directed by your caregiver. Do not give aspirin to children.     For adults with dehydration, ask your caregiver if you should continue all prescribed and over-the-counter medicines.     If your caregiver has given you a follow-up appointment, it is very important to  keep that appointment. Not keeping the appointment could result in a lasting (chronic) or permanent injury and disability. If there is any problem keeping the appointment, you must call to reschedule.  SEEK IMMEDIATE MEDICAL CARE IF:    You are unable to keep fluids down.     There is no urine output in 6 to 8 hours or there is only a small amount of very dark urine.     You develop shortness of breath.     There is blood in the vomit (may look like coffee grounds) or stool.     Belly (abdominal) pain develops, increases, or localizes.     There is persistent vomiting or diarrhea.     You have a fever.     Your baby is older than 3 months with a rectal temperature of 102 F (38.9 C) or higher.     Your baby is 61 months old or younger with a rectal temperature of 100.4 F (38 C) or higher.  MAKE SURE YOU:    Understand these instructions.     Will watch your condition.     Will get help right away if you are not doing well or get worse.  Document Released: 06/06/2005 Document Revised: 02/16/2011 Document Reviewed:  10/18/2006 [facility] Patient Information 2012 [place], [place].

## 2011-05-30 ENCOUNTER — Encounter: Payer: Self-pay | Admitting: Internal Medicine

## 2011-05-31 ENCOUNTER — Emergency Department (HOSPITAL_COMMUNITY): Admission: EM | Admit: 2011-05-31 | Discharge: 2011-05-31 | Discharge status: Against Medical Advice | Payer: 59

## 2011-06-22 ENCOUNTER — Other Ambulatory Visit: Payer: Self-pay | Admitting: *Deleted

## 2011-06-22 MED ORDER — ENALAPRIL MALEATE 10 MG PO TABS: ORAL_TABLET | ORAL | Status: DC

## 2011-06-27 ENCOUNTER — Ambulatory Visit (INDEPENDENT_AMBULATORY_CARE_PROVIDER_SITE_OTHER): Payer: 59 | Admitting: Internal Medicine

## 2011-06-27 DIAGNOSIS — I10 Essential (primary) hypertension: Secondary | ICD-10-CM

## 2011-06-27 MED ORDER — HYDROCHLOROTHIAZIDE 12.5 MG PO CAPS: 12.5000 mg | ORAL_CAPSULE | Freq: Every day | ORAL | Status: DC

## 2011-06-27 NOTE — Progress Notes (Signed)
  Subjective:    Patient ID: Jaime [name], female    DOB: [birth year], 56 y.o.   MRN: [id number]  HPI Jaime [name] brings with her BP readings that are mostly well controlled with occasional excursions. She is very concerned about the highs. She has been asymptomatic.  She has been diagnosed with macular degeneration and needs to have tight BP control.  I have reviewed the patient's medical history in detail and updated the computerized patient record.    Review of Systems System review is negative for any constitutional, cardiac, pulmonary, GI or neuro symptoms or complaints other than as described in the HPI.     Objective:   Physical Exam Filed Vitals:   06/27/11 1123  BP: 142/92  Pulse: 81  Temp: 96.9 F (36.1 C)   Overweight AA woman in no distress HEENT- normal Respirations - normal Pulse - Regular       Assessment & Plan:

## 2011-06-28 NOTE — Assessment & Plan Note (Signed)
Home readings with the majority revealing good control. There is a concern for tight control in light of macular degeneration.  Plan- continue lisinopril           Add HCTZ 12.5 - if tolerated and successful will switch to combination pill

## 2011-07-18 ENCOUNTER — Ambulatory Visit (INDEPENDENT_AMBULATORY_CARE_PROVIDER_SITE_OTHER): Payer: 59

## 2011-07-18 VITALS — BP 110/72

## 2011-07-18 DIAGNOSIS — I10 Essential (primary) hypertension: Secondary | ICD-10-CM

## 2011-07-20 ENCOUNTER — Telehealth: Payer: Self-pay

## 2011-07-20 MED ORDER — ENALAPRIL-HYDROCHLOROTHIAZIDE 10-25 MG PO TABS: 1.0000 | ORAL_TABLET | Freq: Every day | ORAL | Status: DC

## 2011-07-20 NOTE — Telephone Encounter (Signed)
Patient came in for BP check (110/72). She stated that per MD he would send in 2 separated BP medication to her pharmacy. Please advise, not sure what this should be Thanks

## 2011-07-20 NOTE — Telephone Encounter (Signed)
rx sent e-script

## 2011-07-20 NOTE — Telephone Encounter (Signed)
Ok to csall in enalapril/hct 10-/25 # 30, sig 1 po qd, refill prn. Please update med list

## 2011-08-30 ENCOUNTER — Inpatient Hospital Stay (HOSPITAL_COMMUNITY)
Admission: AD | Admit: 2011-08-30 | Discharge: 2011-09-03 | DRG: 312 | Disposition: A | Discharge status: Home / Self Care | Payer: 59 | Source: Ambulatory Visit | Attending: Internal Medicine | Admitting: Internal Medicine

## 2011-08-30 ENCOUNTER — Inpatient Hospital Stay (HOSPITAL_COMMUNITY): Payer: 59

## 2011-08-30 ENCOUNTER — Ambulatory Visit (INDEPENDENT_AMBULATORY_CARE_PROVIDER_SITE_OTHER): Payer: 59 | Admitting: Internal Medicine

## 2011-08-30 ENCOUNTER — Encounter: Payer: Self-pay | Admitting: Internal Medicine

## 2011-08-30 ENCOUNTER — Encounter (HOSPITAL_COMMUNITY): Payer: Self-pay | Admitting: *Deleted

## 2011-08-30 ENCOUNTER — Other Ambulatory Visit: Payer: Self-pay

## 2011-08-30 VITALS — BP 74/46 | HR 91 | Temp 97.1°F | Resp 14 | Wt 196.2 lb

## 2011-08-30 DIAGNOSIS — R5381 Other malaise: Secondary | ICD-10-CM | POA: Diagnosis present

## 2011-08-30 DIAGNOSIS — R55 Syncope and collapse: Principal | ICD-10-CM | POA: Diagnosis present

## 2011-08-30 DIAGNOSIS — I6529 Occlusion and stenosis of unspecified carotid artery: Secondary | ICD-10-CM | POA: Diagnosis present

## 2011-08-30 DIAGNOSIS — G43909 Migraine, unspecified, not intractable, without status migrainosus: Secondary | ICD-10-CM | POA: Diagnosis present

## 2011-08-30 DIAGNOSIS — R0789 Other chest pain: Secondary | ICD-10-CM | POA: Diagnosis present

## 2011-08-30 DIAGNOSIS — R0602 Shortness of breath: Secondary | ICD-10-CM | POA: Diagnosis present

## 2011-08-30 DIAGNOSIS — R9431 Abnormal electrocardiogram [ECG] [EKG]: Secondary | ICD-10-CM | POA: Diagnosis present

## 2011-08-30 DIAGNOSIS — I10 Essential (primary) hypertension: Secondary | ICD-10-CM | POA: Diagnosis present

## 2011-08-30 DIAGNOSIS — Z8711 Personal history of peptic ulcer disease: Secondary | ICD-10-CM

## 2011-08-30 DIAGNOSIS — E042 Nontoxic multinodular goiter: Secondary | ICD-10-CM | POA: Diagnosis present

## 2011-08-30 DIAGNOSIS — I200 Unstable angina: Secondary | ICD-10-CM

## 2011-08-30 DIAGNOSIS — R599 Enlarged lymph nodes, unspecified: Secondary | ICD-10-CM | POA: Diagnosis present

## 2011-08-30 DIAGNOSIS — Z7982 Long term (current) use of aspirin: Secondary | ICD-10-CM

## 2011-08-30 LAB — COMPREHENSIVE METABOLIC PANEL
CO2: 29 mEq/L (ref 19–32)
Calcium: 9.5 mg/dL (ref 8.4–10.5)
Creatinine, Ser: 1.02 mg/dL (ref 0.50–1.10)
GFR calc Af Amer: 70 mL/min — ABNORMAL LOW (ref >90)
GFR calc non Af Amer: 61 mL/min — ABNORMAL LOW (ref >90)
Glucose, Bld: 111 mg/dL — ABNORMAL HIGH (ref 70–99)

## 2011-08-30 LAB — CBC
Platelets: 325 10*3/uL (ref 150–400)
RBC: 4.01 MIL/uL (ref 3.87–5.11)
WBC: 11.2 10*3/uL — ABNORMAL HIGH (ref 4.0–10.5)

## 2011-08-30 LAB — DIFFERENTIAL
Lymphocytes Relative: 21 % (ref 12–46)
Lymphs Abs: 2.3 10*3/uL (ref 0.7–4.0)
Neutrophils Relative %: 67 % (ref 43–77)

## 2011-08-30 LAB — CARDIAC PANEL(CRET KIN+CKTOT+MB+TROPI)
CK, MB: 1.3 ng/mL (ref 0.3–4.0)
Relative Index: 1.2 (ref 0.0–2.5)
Total CK: 110 U/L (ref 7–177)

## 2011-08-30 LAB — LACTATE DEHYDROGENASE: LDH: 286 U/L — ABNORMAL HIGH (ref 94–250)

## 2011-08-30 LAB — MAGNESIUM: Magnesium: 1.8 mg/dL (ref 1.5–2.5)

## 2011-08-30 MED ORDER — ENOXAPARIN SODIUM 40 MG/0.4ML ~~LOC~~ SOLN
40.0000 mg | SUBCUTANEOUS | Status: DC
  Administered 2011-08-30 – 2011-09-02 (×4): 40 mg via SUBCUTANEOUS
  Filled 2011-08-30 (×5): qty 0.4

## 2011-08-30 MED ORDER — ENALAPRIL MALEATE 10 MG PO TABS
10.0000 mg | ORAL_TABLET | Freq: Every day | ORAL | Status: DC
  Administered 2011-08-30 – 2011-09-02 (×5): 10 mg via ORAL
  Filled 2011-08-30 (×6): qty 1

## 2011-08-30 MED ORDER — POTASSIUM CHLORIDE ER 10 MEQ PO TBCR
10.0000 meq | EXTENDED_RELEASE_TABLET | Freq: Once | ORAL | Status: AC
  Administered 2011-08-30: 10 meq via ORAL
  Filled 2011-08-30: qty 1

## 2011-08-30 MED ORDER — HYDROCHLOROTHIAZIDE 25 MG PO TABS
25.0000 mg | ORAL_TABLET | Freq: Every day | ORAL | Status: DC
  Administered 2011-08-30 – 2011-09-02 (×4): 25 mg via ORAL
  Filled 2011-08-30 (×5): qty 1

## 2011-08-30 MED ORDER — ASPIRIN 325 MG PO TABS
325.0000 mg | ORAL_TABLET | Freq: Every day | ORAL | Status: DC
  Administered 2011-08-30 – 2011-09-02 (×4): 325 mg via ORAL
  Filled 2011-08-30 (×5): qty 1

## 2011-08-30 MED ORDER — SODIUM CHLORIDE 0.9 % IJ SOLN
3.0000 mL | Freq: Two times a day (BID) | INTRAMUSCULAR | Status: DC
  Administered 2011-09-02: 3 mL via INTRAVENOUS

## 2011-08-30 MED ORDER — PANTOPRAZOLE SODIUM 40 MG PO TBEC
40.0000 mg | DELAYED_RELEASE_TABLET | Freq: Every day | ORAL | Status: DC
  Administered 2011-08-31 – 2011-09-02 (×3): 40 mg via ORAL
  Filled 2011-08-30 (×4): qty 1

## 2011-08-30 MED ORDER — SENNOSIDES-DOCUSATE SODIUM 8.6-50 MG PO TABS
1.0000 | ORAL_TABLET | Freq: Every evening | ORAL | Status: DC | PRN
  Filled 2011-08-30: qty 1

## 2011-08-30 MED ORDER — ENALAPRIL-HYDROCHLOROTHIAZIDE 10-25 MG PO TABS
1.0000 | ORAL_TABLET | Freq: Every day | ORAL | Status: DC
Start: 2011-08-30 — End: 2011-08-30

## 2011-08-30 MED ORDER — GABAPENTIN 100 MG PO CAPS
100.0000 mg | ORAL_CAPSULE | Freq: Every day | ORAL | Status: DC
  Administered 2011-08-31 – 2011-09-02 (×3): 100 mg via ORAL
  Filled 2011-08-30 (×4): qty 1

## 2011-08-30 MED ORDER — ZOLPIDEM TARTRATE 5 MG PO TABS: 5.0000 mg | ORAL_TABLET | Freq: Every evening | ORAL | Status: DC | PRN

## 2011-08-30 MED ORDER — DEXTROSE-NACL 5-0.45 % IV SOLN
INTRAVENOUS | Status: DC
  Administered 2011-08-30 – 2011-09-02 (×5): via INTRAVENOUS

## 2011-08-30 NOTE — Progress Notes (Signed)
  Subjective:    Patient ID: Jaime [name], female    DOB: May 08, 1956, 56 y.o.   MRN: [id number]  HPI Ms. Lenis Dickinson presents for 3 episodes (mid-February, beginning of March and 3 days ago) of sudden on-set of sudden fatigue, hot, sweaty, visual change-bright flash like , near-syncope with nausea. Her symptoms will peak in 20 minutes but she will feel weak for 2-3 hours. She describes this like she was running and is exhausted by this. She does describe mild chest pressure with arm pain. She also describes decreased exercise tolerance.  Patient admitted for possible unstable angina. See hospital H&P  Review of Systems     Objective:   Physical Exam        Assessment & Plan:

## 2011-08-30 NOTE — H&P (Signed)
Jaime [name] is an 56 y.o. female.   Chief Complaint: Episodes of profound weakness HPI: Jaime [name] reports that she has had 3 episodes, Feb, late Feb and early March, of sudden on-set of profound weakness, light-headedness, diaphoresis, nausea. She feels like she has been running hard. Symptoms peaked in about 20 minutes and she felt weak for several hours. Over the past month she has decreased exercise tolerance: walking to the car, going up stair really is hard. She also reports that she will have tightness/heaviness in her chest with exertion. She has multiple cardiac risk factors including older female, positive family history for heart diseae, carotid artery disease, hypertension and borderline cholesterol with LDL 120 HDL 60. She is now admitted with crescendo unstable angina manifested as decreased exercise tolerance with chest tightness.  Past Medical History  Diagnosis Date  . PVD (peripheral vascular disease)   . Migraine   . Meningioma   . 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 Nontoxic multinodular goiter   . Peptic ulcer disease     Past Surgical History  Procedure Date  . Tubal ligation   . Tonsillectomy 1965  . Ovarian cyst removal   . Orif ankle fracture     right    Family History  Problem Relation Age of Onset  . Hypertension Mother   . Diabetes Mother   . Esophageal cancer Father   . Breast cancer Neg Hx   . Coronary artery disease Neg Hx   . Colon cancer Other    Social History:  reports that she has quit smoking. She has never used smokeless tobacco. Her alcohol and drug histories not on file.  Allergies: No Known Allergies  No current facility-administered medications on file as of .   Medications Prior to Admission  Medication Sig Dispense Refill  . aspirin 325 MG tablet Take 325 mg by mouth daily.        Marland Kitchen aspirin-acetaminophen-caffeine (EXCEDRIN MIGRAINE) 250-250-65 MG per tablet Take 1 tablet by  mouth as needed.        . benzonatate (TESSALON PERLES) 100 MG capsule Take 1 capsule (100 mg total) by mouth 3 (three) times daily as needed for cough.  30 capsule  3  . cyclobenzaprine (FLEXERIL) 10 MG tablet Take 10 mg by mouth every 8 (eight) hours as needed.        . enalapril (VASOTEC) 10 MG tablet For blood pressure   5 tablet  0  . enalapril-hydrochlorothiazide (VASERETIC) 10-25 MG per tablet Take 1 tablet by mouth daily.  30 tablet  11  . ferrous gluconate (FERGON) 325 MG tablet Take 325 mg by mouth daily.        Marland Kitchen gabapentin (NEURONTIN) 100 MG capsule Take 1 capsule (100 mg total) by mouth daily as needed.  30 capsule  11  . hydrochlorothiazide (MICROZIDE) 12.5 MG capsule Take 1 capsule (12.5 mg total) by mouth daily.  30 capsule  11  . pantoprazole (PROTONIX) 40 MG tablet TAKE 1 TABLET BY MOUTH EVERY MORNING  30 tablet  5  . potassium chloride (K-DUR) 10 MEQ tablet 1 tablet twice a day for about 10 days or until the cramps are better   30 tablet  1  . zolpidem (AMBIEN) 10 MG tablet Take 10 mg by mouth at bedtime as needed.          No results found for this or any previous visit (from the past 48 hour(s)). No results  found.  Review of Systems  Constitutional: Positive for fever, malaise/fatigue and diaphoresis.  HENT: Negative for hearing loss, nosebleeds and congestion.   Eyes: Negative for blurred vision, double vision, pain and redness.  Respiratory: Positive for shortness of breath. Negative for cough.   Cardiovascular: Positive for chest pain and palpitations. Negative for orthopnea.  Gastrointestinal: Negative for heartburn, nausea, vomiting and abdominal pain.  Genitourinary: Negative.   Musculoskeletal: Negative.   Skin: Negative.   Neurological: Positive for weakness. Negative for headaches.  Endo/Heme/Allergies: Negative.   Psychiatric/Behavioral: Negative.      Physical Exam  Virtals: BP 78/52  HR 91, R 14 T 97.1 O2 sat 96% Gen'l- overweight AA woman in no  acute distress HEENT- C&S clear, [place]/AT, oropharynx without lesions Neck- supple, no thyromegaly Cor- 2+ radial pulse, no JVD, no carotid bruits, RRR w/o murmur rub or gallop Pulm - normal respirations w/o rales or wheezes Abd- BS+, soft, no guarding or rebound, no tenderness Pelvic/rectal - deferred Ext - w/o deformity Neuro - A&O x 3, CN II-XII normal  Assessment/Plan 1. Cardiac - patient gives a history of progress exercise intolerance with chest tightness on exertion. She also has had acute episodes of near syncope that are suggestive of a tachyrhythmia, i.e. PSVT. She has multiple risk factors.  Plan - tele admit           Cycle cardiac enzymes, check LDL isoenzymes           2 D echo - assess LV function and wall motion           Routine labs           Cardiology consult [facility]  2. HTN - stable as a rule but low BP today  Plan - IVF @ 75 cc/hr           Hold lisinopril HCT if sbp less than 100  3. PVD - no neuro symptoms Last carotid exam by [facility] 1 year ago at [facility] where she is followed for meningioma.    Plan - continue aspirin.   Jaime [name] 08/30/2011, 6:52 PM

## 2011-08-31 DIAGNOSIS — I10 Essential (primary) hypertension: Secondary | ICD-10-CM

## 2011-08-31 DIAGNOSIS — R5383 Other fatigue: Secondary | ICD-10-CM

## 2011-08-31 DIAGNOSIS — R5381 Other malaise: Secondary | ICD-10-CM

## 2011-08-31 LAB — CARDIAC PANEL(CRET KIN+CKTOT+MB+TROPI)
Relative Index: INVALID (ref 0.0–2.5)
Total CK: 94 U/L (ref 7–177)
Troponin I: <0.3 ng/mL (ref <0.30)

## 2011-08-31 NOTE — Progress Notes (Signed)
Subjective: Resting comfortably. She has had no c/p at rest. She has no respiratory distress at rest  Objective: Lab: Lab Results  Component Value Date   WBC 11.2* 08/30/2011   HGB 10.8* 08/30/2011   HCT 33.3* 08/30/2011   MCV 83.0 08/30/2011   PLT 325 08/30/2011   BMET    Component Value Date/Time   NA 136 08/30/2011 2034   K 3.6 08/30/2011 2034   CL 97 08/30/2011 2034   CO2 29 08/30/2011 2034   GLUCOSE 111* 08/30/2011 2034   GLUCOSE 104* 05/05/2006 1206   BUN 23 08/30/2011 2034   CREATININE 1.02 08/30/2011 2034   CALCIUM 9.5 08/30/2011 2034   GFRNONAA 61* 08/30/2011 2034   GFRAA 70* 08/30/2011 2034    Cardiac Panel (last 3 results)  Basename 08/31/11 0333 08/30/11 2033  CKTOTAL 94 110  CKMB 1.2 1.3  TROPONINI <0.30 <0.30  RELINDX RELATIVE INDEX IS INVALID 1.2     Imaging:Chest x-ray CHEST - 2 VIEW  Comparison: 02/16/2011  Findings: Right hilar mass effect is present. Normal heart size.  Clear lungs. No pneumothorax. No pleural effusion.  IMPRESSION:  Right hilar adenopathy.    Physical Exam: Filed Vitals:   08/31/11 0500  BP: 108/71  Pulse: 74  Temp:   Resp: 16  WNWD AA woman in no distress Cor - 2+ radial pulse, RRR. Tele - normal PUlm - normal respiratons Abd- soft     Assessment/Plan: 1,. Cardiac - enzymes negative to date.  Plan - complete cycling enzymes           For 2D echo today           Will hold consult until more data back  2. PUlm - x-ray with hilar adenopathy. Reviewed previous CT chest - hilar adenopathy suggestive of sarcoid. Plan - ACE level           If ACE elevated will start steroids  3. HTN- low blood pressure resolved  4. PVD - stable   Jaime [name] 08/31/2011, 8:11 AM

## 2011-09-01 ENCOUNTER — Encounter (HOSPITAL_COMMUNITY): Payer: Self-pay | Admitting: *Deleted

## 2011-09-01 NOTE — Plan of Care (Signed)
Problem: Phase II Progression Outcomes Goal: Stress Test if indicated Outcome: Progressing ORDERED FOR IN AM 09/02/2011

## 2011-09-01 NOTE — Progress Notes (Signed)
Subjective: [name] has been feeling ok, no recurrent near syncopal spells, no at rest chest pain.  Dr. Michaelle [name] consult appreciated.  Objective: Lab:  Imaging: 2 D echo with normal EF Study Conclusions  - Procedure narrative: Transthoracic echocardiography. Image quality was poor. - Left ventricle: The cavity size was normal. Wall thickness was normal. Systolic function was normal. The estimated ejection fraction was in the range of 55% to 60%. Left ventricular diastolic function parameters were normal.    Physical Exam: Filed Vitals:   09/01/11 1829  BP: 116/75  Pulse: 90  Temp: 98.9 F (37.2 C)  Resp: 18  WNWD AA woman in no distress Chest - good breath sounds, unlabored Cor - 2+ radial, quiet precordium, RRR. I did not appreciate carotid bruit Abd- soft     Assessment/Plan: 1. Cardiac - CE negative x 3; 2 D echo normal. Tele without arryythmia. Plan - for nuclear stress test tomorrow            For carotid doppler tomorrow.   2. PUlm - chart reviewed: CT chest Feb 16, 2011 Comparison: 12/07/2005  Findings: Shotty symmetric bilateral hilar and mediastinal  lymphadenopathy shows no significant change since prior exam.  Largest lymph nodes in the hilar regions measure up to 12 cm in  short axis. This is suspicious for sarcoidosis or other  inflammatory etiology. Shotty bilateral axillary lymph nodes are  also seen in stable.  The both lungs are clear, and there is no evidence of acute  infiltrate or chronic lung disease. No suspicious pulmonary  nodules or masses are identified. No evidence of endobronchial  lesion. No evidence of pleural or pericardial effusion. No  evidence of splenomegaly.  IMPRESSION:  1. No evidence of pulmonary disease.  2. Stable shotty symmetric bilateral hilar and mediastinal  lymphadenopathy, suspicious for sarcoidosis or other chronic  inflammatory etiology.  Original Report Authenticated By: Jaime [name], M.D.  3. HTN -  better but she did dip into the 90's today No change in regimen  4. PVD - last look at carotids was with MRI done at [facility]. Plan - carotid dopplers     Jaime [name] 09/01/2011, 7:08 PM

## 2011-09-01 NOTE — Consult Note (Signed)
Admit date: 08/30/2011 Referring Physician  [name], M.D. Primary Physician  ? Primary Cardiologist  none Reason for Consultation  exertional fatigue and chest discomfort among other complaints  ASSESSMENT: 1. Chest discomfort with exertion  2. Unexplained weakness and fatigue, occurring in discrete episodes lasting up to 20 minutes? Cause.  3. Right hilar adenopathy  4. Peripheral vascular disease  5. Peptic ulcer disease  6. Abnormal EKG with nonspecific interventricular conduction delay  7. Left carotid bruit  PLAN:  1. Stress/pharmacologic nuclear perfusion study to rule out myocardial ischemia  2. Evaluation of hilar adenopathy  3. Will make sure that the echocardiogram is interpreted as on the chart tonight (no significant abnormalities noted, LV EF greater than 60%)  4. Given the left carotid bruit and the patient's believe that she has prior documentation of right carotid disease, I would give consideration to repeating carotid Dopplers at some point if not recently done.   HPI: For the past 2-3 months the patient has had episodes of profound warmth, and extreme fatigue and weakness. These episodes seem to occur spontaneously. They and not associated with chest discomfort or palpitations. She has to lay down. They can take up to 20 or 30 minutes to resolve. Her mentation and vision remained stable. After resting she can resume activities and feel back to normal. Additionally she complains that on exertion she has had a feeling of pressure in her chest. This predates the episodes just mentioned I several months. This hospitalization occurred because of a prolonged episode of the weakness mentioned above. She is been found to have fullness/hilar adenopathy involving her right lung. She denies orthopnea, PND, heart disease, diabetes, but does have hypertension and hyperlipidemia. There is a family history of coronary disease. She has a carotid bruit on the left and a  history of carotid plaque on the right. There is no history of rheumatic heart disease or heart murmur.   PMH:   Past Medical History  Diagnosis Date  . PVD (peripheral vascular disease)   . Migraine   . Meningioma   . 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 Nontoxic multinodular goiter   . Peptic ulcer disease      PSH:   Past Surgical History  Procedure Date  . Tubal ligation   . Tonsillectomy 1965  . Ovarian cyst removal   . Orif ankle fracture     right  . Trigger finger release 2012    right thumb    Allergies:  Review of patient's allergies indicates no known allergies. Prior to Admit Meds:   Prescriptions prior to admission  Medication Sig Dispense Refill  . aspirin 325 MG tablet Take 325 mg by mouth daily.      . cyclobenzaprine (FLEXERIL) 10 MG tablet Take 10 mg by mouth 3 (three) times daily as needed. For muscle aches      . enalapril-hydrochlorothiazide (VASERETIC) 10-25 MG per tablet Take 1 tablet by mouth daily.      Marland Kitchen gabapentin (NEURONTIN) 100 MG capsule Take 100 mg by mouth 3 (three) times daily.      . Multiple Vitamins-Minerals (MULTIVITAMINS THER. W/MINERALS) TABS Take 1 tablet by mouth daily.      . pantoprazole (PROTONIX) 40 MG tablet Take 40 mg by mouth daily.      . potassium chloride (K-DUR) 10 MEQ tablet Take 10 mEq by mouth 2 (two) times daily as needed. For cramps       Fam  HX:    Family History  Problem Relation Age of Onset  . Hypertension Mother   . Diabetes Mother   . Esophageal cancer Father   . Breast cancer Neg Hx   . Coronary artery disease Neg Hx   . Colon cancer Other    Social HX:    History   Social History  . Marital Status: Married    Spouse Name: N/A    Number of Children: N/A  . Years of Education: N/A   Occupational History  . Computer operator  [employer]   Social History Main Topics  . Smoking status: Former Games developer  . Smokeless tobacco: Never Used  . Alcohol Use:  Not on file  . Drug Use: Not on file  . Sexually Active: Not on file   Other Topics Concern  . Not on file   Social History Narrative   HSG-P[employer], 2 years at [employer]  Married 1989 - marriage is ok.  Daughter - 1979, 2 step-sons; 2 grand-children. 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      Review of Systems: She has a history of meningioma. She is a history of a thyroid abnormality. No history of gastrointestinal disease.  Physical Exam: Blood pressure 92/60, pulse 77, temperature 97.8 F (36.6 C), temperature source Oral, resp. rate 18, height 5\' 6"  (1.676 m), weight 88.905 kg (196 lb), SpO2 97.00%. Weight change:   The patient is moderately obese, lying comfortably in the hospital bed at 46. She is bespectacled.  The neck exam reveals a soft left carotid bruit. The thyroid is not palpable.  Cardiac exam reveals a soft systolic murmur at the left midsternal border. No gallop rub or clicks are heard.  The lungs are clear the auscultation and percussion.  Abdomen is mildly obese but no obvious organomegaly is appreciated. No bruits are heard.  Extremities reveal no edema. The radial and posterior tibial pulses are symmetric and in the 2+ range.  The neurological exam is grossly intact to Labs:   Lab Results  Component Value Date   WBC 11.2* 08/30/2011   HGB 10.8* 08/30/2011   HCT 33.3* 08/30/2011   MCV 83.0 08/30/2011   PLT 325 08/30/2011    Lab 08/30/11 2034  NA 136  K 3.6  CL 97  CO2 29  BUN 23  CREATININE 1.02  CALCIUM 9.5  PROT 7.6  BILITOT 0.2*  ALKPHOS 103  ALT 15  AST 21  GLUCOSE 111*   No results found for this basename: PTT   No results found for this basename: INR, PROTIME   Lab Results  Component Value Date   CKTOTAL 90 08/31/2011   CKMB 1.2 08/31/2011   TROPONINI <0.30 08/31/2011     Lab Results  Component Value Date   CHOL 183 09/14/2009   CHOL 190 08/14/2008   CHOL 180 05/03/2007   Lab  Results  Component Value Date   HDL 52.10 09/14/2009   HDL 16.1 08/14/2008   HDL 41.2 05/03/2007   Lab Results  Component Value Date   LDLCALC 120* 09/14/2009   LDLCALC 134* 08/14/2008   LDLCALC 120* 05/03/2007   Lab Results  Component Value Date   TRIG 54.0 09/14/2009   TRIG 48 08/14/2008   TRIG 93 05/03/2007   Lab Results  Component Value Date   CHOLHDL 4 09/14/2009   CHOLHDL 4.1 CALC 08/14/2008   CHOLHDL 4.4 CALC 05/03/2007   ECHOCARDIOGRAM:   ------------------------------------------------------------ Study Conclusions  -  Procedure narrative: Transthoracic echocardiography. Image quality was poor. - Left ventricle: The cavity size was normal. Wall thickness was normal. Systolic function was normal. The estimated ejection fraction was in the range of 55% to 60%. Left ventricular diastolic function parameters were normal.   Radiology:  X-ray Chest Pa And Lateral   08/30/2011  *RADIOLOGY REPORT*  Clinical Data: Chest tightness  CHEST - 2 VIEW  Comparison: 02/16/2011  Findings: Right hilar mass effect is present.  Normal heart size. Clear lungs.  No pneumothorax.  No pleural effusion.  IMPRESSION: Right hilar adenopathy.  Original Report Authenticated By: Donavan Burnet, M.D.   EKG:  Normal sinus rhythm, leftward axis, nonspecific interventricular conduction delay.    Lesleigh Noe 09/01/2011 5:08 PM

## 2011-09-01 NOTE — Progress Notes (Signed)
*  PRELIMINARY RESULTS* Echocardiogram 2D Echocardiogram has been performed.  Katheren Puller 09/01/2011, 2:59 PM

## 2011-09-01 NOTE — Progress Notes (Signed)
Preliminary note: Spoke with patient who is resting comfortably.  Cardiac enzymes are negative and no reports of abnormal tele. 2 D echo still pending (ordered 08/30/11)  ACE level normal.   A/P 1. Cardiac - waiting for 2- D echo                     If 2 D echo not definitive she may benefit from                        Nuclear stress                     Will call [facility] cardiology for consult  2. Pulm - no further work-up at this time  3. HTN - controlled

## 2011-09-02 ENCOUNTER — Inpatient Hospital Stay (HOSPITAL_COMMUNITY): Payer: 59

## 2011-09-02 DIAGNOSIS — R55 Syncope and collapse: Secondary | ICD-10-CM

## 2011-09-02 MED ORDER — TECHNETIUM TC 99M TETROFOSMIN IV KIT
30.0000 | PACK | Freq: Once | INTRAVENOUS | Status: AC | PRN
  Administered 2011-09-02: 30 via INTRAVENOUS

## 2011-09-02 MED ORDER — TECHNETIUM TC 99M TETROFOSMIN IV KIT
10.0000 | PACK | Freq: Once | INTRAVENOUS | Status: AC | PRN
  Administered 2011-09-02: 10 via INTRAVENOUS

## 2011-09-02 MED ORDER — REGADENOSON 0.4 MG/5ML IV SOLN
0.4000 mg | Freq: Once | INTRAVENOUS | Status: AC
  Administered 2011-09-02: 0.4 mg via INTRAVENOUS

## 2011-09-02 NOTE — Progress Notes (Signed)
Subjective: NO complaints - no chest pain or recurrent near-syncopal episodes.  Objective: Lab: No new lab Imaging: Nuclear stress done but not read.  Carotid doppler - preliminary reading by technician is occlusion on right , left is normal   Physical Exam: Filed Vitals:   09/02/11 1336  BP: 127/83  Pulse: 83  Temp: 98.2 F (36.8 C)  Resp: 18  pulm - normal Cor - RRR     Assessment/Plan: 1. Cardiac - dong well. Will d/c in Am if final read on stress test is normal 2. PUlm - no further work up on lymphadenopathy 3. PVD - stable   [name] 09/02/2011, 2:55 PM

## 2011-09-02 NOTE — Progress Notes (Signed)
No PRoblems during the night. She has completed the Lexiscan stress part of her study. If there are abnormalities on nuclear testing, I would recommend no futher CV w/u other than following carotid disease. If nuclear is abnormal, she may need cath.

## 2011-09-02 NOTE — Progress Notes (Signed)
VASCULAR LAB PRELIMINARY  PRELIMINARY  PRELIMINARY  PRELIMINARY  Carotid duplex has been completed.    Preliminary report:  Right ICA is occluded.  No significant ICA stenosis on left.  Vertebral artery flow antegrade.  Jaime [name], 09/02/2011, 1:07 PM

## 2011-09-03 NOTE — Progress Notes (Signed)
Subjective:  No recurrent chest pain.  Cardiolite was normal.  EF 70%  Objective:  Vital Signs in the last 24 hours: BP 111/57  Pulse 84  Temp(Src) 97.9 F (36.6 C) (Oral)  Resp 18  Ht 5\' 6"  (1.676 m)  Wt 86.637 kg (191 lb)  BMI 30.83 kg/m2  SpO2 97%  Physical Exam: Pleasant BP in NAD Lungs:  Clear to A&P Cardiac:  Regular rhythm, normal S1 and S2, no S3 Extremities:  No edema present  Intake/Output from previous day: 03/15 0701 - 03/16 0700 In: 1140 [P.O.:240; I.V.:900] Out: -    Lab Results:  Cardiac Enzymes:  Basename 08/31/11 1116  CKTOTAL 90  CKMB 1.2  CKMBINDEX --  TROPONINI <0.30   Telemetry: Sinus rhyhm  Carotid doppler:  Occluded R carotid  Nuclear EF 70% NO ISCHEMIA  Assessment/Plan:  1. Low risk myocardial perfusion scan 2. Carotid artery disease 3. Coronary risk equivalent  Rec:  No further coronary workup necessary.  Will need good BP management, lipid goal of less than 70 LDL, weight loss, exercise as outpatient.  W/u carotid disease. F/U AS NEEDED.     Darden Palmer.  MD [facility] 09/03/2011, 8:56 AM

## 2011-09-03 NOTE — Discharge Summary (Signed)
NAMEJAMARA, VARY NO.:  [id number]  MEDICAL RECORD NO.:  [id number]  LOCATION:  3728                         FACILITY:  [facility]  PHYSICIAN:  Rosalyn Gess. Daysie Helf, MD  DATE OF BIRTH:  Jun 16, 1956  DATE OF ADMISSION:  08/30/2011 DATE OF DISCHARGE:  09/03/2011                              DISCHARGE SUMMARY   ADMITTING DIAGNOSES: 1. Unstable angina. 2. Near syncopal episodes.  DISCHARGE DIAGNOSES: 1. Cardiovascular disease, ruled out. 2. Hilar lymphadenopathy, stable. 3. Near syncope, normal telemetry.  CONSULTANT:  Lyn Records, MD for Cardiology.  PROCEDURES: 1. A 2D echo performed on March 14, which was unremarkable with a     normal ejection fraction of 55-60%.  Left ventricular diastolic     function parameters were normal.  No valvular abnormalities were     noted. 2. Myoview stress test, which was read out as no evidence of ischemia     and a low risk study. 3. Carotid Doppler examination, which revealed occlusion on the right     ICA which appears chronic.  No left ICA stenosis or plaque is     noted.  HISTORY OF PRESENT ILLNESS:  Ms. Wartman is a very pleasant 56 year old woman who presented to the office, complaining of 3 episodes of near syncope.  She described a generalized sense of overall weakness, shortness of breath, and decreased level of consciousness, but no loss of consciousness.  Episodes were peaking at about 20 minutes, but she felt weak and decimated for several hours.  She reports no chest pain. No palpitations at that time.  She had no loss of control of bowel or bladder.  She had no postictal period.  The patient also reported that she had been having increasing shortness of breath with exertion such as stair climbing or walking along with some chest pressure.  For this reason, she was admitted to hospital for further evaluation to rule out coronary artery disease/ischemia.  Please see the H and P and other EMR records for past  medical history, family history, social history, and admission exam.  HOSPITAL COURSE: 1. Cardiovascular.  The patient had negative cardiac enzymes.  The     patient had a 2D echo and nuclear stress test is noted.  There was     no evidence of occlusive coronary disease.  It was felt that the     patient did not really have any significant high risk for coronary     artery events and was felt to be stable for discharge without     further evaluation or workup. 2. Pulmonary.  The patient had significant hilar adenopathy.  Reviewed     the patient's record and she had had a CT scan of the chest in 2007     and CT scan of the chest in August, 2012, which showed shotty     symmetrical bilateral hilar and mediastinal lymphadenopathy without     any progression.  Largest lymph node in the hilum was 12 cm in the     short axis.  This was suspicious for sarcoid on previous studies.     Lungs were otherwise clear on that study in August. Chest  x-rays     reveals hilar adenopathy and clear lungs.  ACE level was obtained,     which was normal, making active sarcoidosis unlikely.  Explanation     for the patient's progressive shortness of breath not determined at     time of this hospitalization and discharge.  Plan will be for     outpatient pulmonary evaluation. 3. Hypertension.  The patient has done well with a stable blood     pressures, although she was hypotensive at admission, this is     resolved.  She will resume and continue all of her home     medications. 4. Peripheral vascular disease.  The patient has known chronic     occlusion of the right ICA.  She is followed at [facility]     for meningioma and has had vascular evaluations at [facility]     as well.  Studies this admission showed no left ICA stenosis or     other abnormalities.  No further workup is indicated.  With the patient being evaluated and ruled out for active coronary disease with her being stable on  telemetry, with no arrhythmias, with her peripheral vascular disease being fully evaluated, at this time she is stable and ready for discharge to home with close office followup including referral to Pulmonary as an outpatient.  DISCHARGE EXAMINATION:  VITAL SIGNS:  Temperature was 97.9, blood pressure 111/57, heart rate 84, respirations 18, O2 sats 97% on room air. GENERAL APPEARANCE:  This is a well-nourished, heavy-set African American woman in no acute distress. HEENT:  Unremarkable. NECK:  Supple. CHEST:  The patient is moving air well with no rales, wheezes, or rhonchi. CARDIOVASCULAR:  The patient had a quiet precordium.  She has a regular rate and rhythm without murmurs. ABDOMEN:  Soft. Neurological:  The patient is awake, alert.  She is oriented to person, place, time, and context.  FINAL LABORATORY DATA:  Cardiac enzymes were negative x3.  Final chemistry panel from the day of admission with a sodium of 136, potassium 3.6, chloride 97, CO2 of 29, BUN 23, creatinine 1.02, glucose 111.  Liver functions were normal.  DISPOSITION:  The patient is discharged home.  She will be seen in the office in 5-7 days.  She will continue all her home medications.  The patient's condition at time of discharge dictation is medically stable and ready for discharge.     Rosalyn Gess Willie Plain, MD     MEN/MEDQ  D:  09/03/2011  T:  09/03/2011  Job:  [id number]  cc:   Lyn Records, M.D.

## 2011-09-03 NOTE — Progress Notes (Signed)
Patient is stable. All studies completed - no evidence of active CAD. Normal telemetry  Plan - d/c home w/ office follow-up.  Dictation # [id number]

## 2011-09-08 ENCOUNTER — Encounter: Payer: Self-pay | Admitting: Internal Medicine

## 2011-09-08 ENCOUNTER — Ambulatory Visit (INDEPENDENT_AMBULATORY_CARE_PROVIDER_SITE_OTHER): Payer: 59 | Admitting: Internal Medicine

## 2011-09-08 VITALS — BP 92/64 | HR 95 | Temp 97.7°F | Resp 18 | Wt 197.0 lb

## 2011-09-08 DIAGNOSIS — R0600 Dyspnea, unspecified: Secondary | ICD-10-CM

## 2011-09-08 DIAGNOSIS — R590 Localized enlarged lymph nodes: Secondary | ICD-10-CM | POA: Insufficient documentation

## 2011-09-08 DIAGNOSIS — R0609 Other forms of dyspnea: Secondary | ICD-10-CM

## 2011-09-08 DIAGNOSIS — I6529 Occlusion and stenosis of unspecified carotid artery: Secondary | ICD-10-CM

## 2011-09-08 DIAGNOSIS — R55 Syncope and collapse: Secondary | ICD-10-CM

## 2011-09-08 DIAGNOSIS — I10 Essential (primary) hypertension: Secondary | ICD-10-CM

## 2011-09-08 DIAGNOSIS — R599 Enlarged lymph nodes, unspecified: Secondary | ICD-10-CM

## 2011-09-08 NOTE — Assessment & Plan Note (Signed)
Patients symptoms may be neurologically mediated.  Plan - neurology evaluation - since she is followed by NS at [facility] for meningioma will refer to neurology at [facility].

## 2011-09-08 NOTE — Progress Notes (Signed)
  Subjective:    Patient ID: Jaime [name], female    DOB: 01/07/1956, 56 y.o.   MRN: [id number]  HPI Ms. Urquidi was recently hospitalized for DOE as possible atypical presentation of unstable angina. She was also evaluated for arrythmia on telemetry as possible explanation for near-syncopal episodes. Hospital records reviewed: during her stay 2 D echo was normal, myoview stress test was normal and telemetry was normal. Dr. Verdis Prime consulted for cardiology.  Ms. Jewel on x-ray had a right hilar lymph node 12 cm in length. CT was done and compared to CT chest from '07 - no change in the hilar lymphadnopathy. ACE level was checked and was in normal range.  Since discharge she has had 1 episode of near syncope Tuesday, March 19th.  PMH, FamHx and SocHx reviewed for any changes and relevance.    Review of Systems System review is negative for any constitutional, cardiac, pulmonary, GI or neuro symptoms or complaints other than as described in the HPI.     Objective:   Physical Exam Filed Vitals:   09/08/11 1142  BP: 92/64  Pulse: 95  Temp: 97.7 F (36.5 C)  Resp: 18   Gen'l - overweight AA woman in no distress Cor - 2+ radial, RRR Pulm - normal respirations at rest Neuro - A&O x 3, CN II-XII normal, normal gait.         Assessment & Plan:

## 2011-09-08 NOTE — Patient Instructions (Signed)
Lightheaded spells = near syncope. Heart checked out OK: no evidence of any heart artery blockage, normal heart function by 2 D echo. These near-syncopal episodes may be neurologically mediated.   Plan - call Dr. Tawana Scale office and tell them that your internist thinks you should have a neurologist evaluate you and can they help with a referral to a doctor on staff at [facility].  Lungs - you have this shortness of breath and there is an enlarged lymph node at the root of your lung. The test for sarcoid was negative and we want a lung specialist to help evaluate your shortness of breath and the enlarged lymph nodes in the chest.

## 2011-09-08 NOTE — Assessment & Plan Note (Signed)
Patient with DOE and adenopathy. ACE level was normal but sarcoid not 100% excluded  Plan - patient to be referred to pulmonary for evaluation.

## 2011-09-08 NOTE — Assessment & Plan Note (Signed)
Patient with chronic right ICA occlusion. Her symptoms of near-syncope very unlikely to be related.

## 2011-09-08 NOTE — Assessment & Plan Note (Signed)
BP Readings from Last 3 Encounters:  09/08/11 92/64  09/03/11 111/57  08/30/11 74/46   good control

## 2011-09-12 ENCOUNTER — Ambulatory Visit (INDEPENDENT_AMBULATORY_CARE_PROVIDER_SITE_OTHER): Payer: 59 | Admitting: Internal Medicine

## 2011-09-12 ENCOUNTER — Encounter: Payer: Self-pay | Admitting: Internal Medicine

## 2011-09-12 VITALS — BP 92/62 | HR 87 | Temp 98.1°F | Ht 66.0 in | Wt 197.8 lb

## 2011-09-12 DIAGNOSIS — R053 Chronic cough: Secondary | ICD-10-CM

## 2011-09-12 DIAGNOSIS — R590 Localized enlarged lymph nodes: Secondary | ICD-10-CM

## 2011-09-12 DIAGNOSIS — R599 Enlarged lymph nodes, unspecified: Secondary | ICD-10-CM

## 2011-09-12 DIAGNOSIS — R059 Cough, unspecified: Secondary | ICD-10-CM

## 2011-09-12 DIAGNOSIS — I10 Essential (primary) hypertension: Secondary | ICD-10-CM

## 2011-09-12 DIAGNOSIS — R05 Cough: Secondary | ICD-10-CM

## 2011-09-12 NOTE — Progress Notes (Signed)
  Subjective:    Patient ID: Jaime [name], female    DOB: 02/24/1956   MRN: [id number]  HPI  29 yobf never regular smoker with asthma as child but grew in elementary school and did not require inhlaler  But new onset sob 05/2011 referred 09/12/2011 to pulmonary clinic by Dr Debby Bud  09/12/2011 1st pulmonary eval cc mild dry cough x one year comes and goes with sensation of something stuck in throat better with ppi then developed multiple  spells x 15 min typically brought on by activity, assoc with sensation of weakness and can't get her breath, not necessarily proportionate to activity but increasing in frequency and may occur more often immediately p standing. No overt sinus or reflux symptoms at present, not better with otcs or inhalers  Sleeping ok without nocturnal  or early am exacerbation  of respiratory  c/o's or need for noct saba. Also denies any obvious fluctuation of symptoms with weather or environmental changes or other aggravating or alleviating factors except as outlined above         Review of Systems  Constitutional: Negative for fever, chills and unexpected weight change.  HENT: Positive for trouble swallowing. Negative for ear pain, nosebleeds, congestion, sore throat, rhinorrhea, sneezing, dental problem, voice change, postnasal drip and sinus pressure.   Eyes: Negative for visual disturbance.  Respiratory: Positive for cough and shortness of breath. Negative for choking.   Cardiovascular: Positive for chest pain. Negative for leg swelling.  Gastrointestinal: Negative for vomiting, abdominal pain and diarrhea.  Genitourinary: Negative for difficulty urinating.  Musculoskeletal: Positive for arthralgias.  Skin: Negative for rash.  Neurological: Negative for tremors, syncope and headaches.  Hematological: Does not bruise/bleed easily.       Objective:   Physical Exam  09/12/2011  Wt 197   amb bf slt hoarse nad  HEENT: nl dentition, turbinates, and orophanx. Nl  external ear canals without cough reflex   NECK :  without JVD/Nodes/TM/ nl carotid upstrokes bilaterally   LUNGS: no acc muscle use, clear to A and P bilaterally without cough on insp or exp maneuvers   CV:  RRR  no s3 or murmur or increase in P2, no edema   ABD:  soft and nontender with nl excursion in the supine position. No bruits or organomegaly, bowel sounds nl  MS:  warm without deformities, calf tenderness, cyanosis or clubbing  SKIN: warm and dry without lesions    NEURO:  alert, approp, no deficits   cxr 08/30/11 Right hilar adenopathy.      Assessment & Plan:

## 2011-09-12 NOTE — Patient Instructions (Signed)
Stop vasoretic for now and let us know if your blood pressure when you take it at home is over 140/90   Please schedule a follow up office visit in 2 weeks, sooner if needed

## 2011-09-13 NOTE — Assessment & Plan Note (Signed)
She is orthostatic and having atypical upper airway symptoms mimicking gerd so reasonable to try off for now

## 2011-09-13 NOTE — Assessment & Plan Note (Signed)
The most common causes of chronic cough in immunocompetent adults include the following: upper airway cough syndrome (UACS), previously referred to as postnasal drip syndrome (PNDS), which is caused by variety of rhinosinus conditions; (2) asthma; (3) GERD; (4) chronic bronchitis from cigarette smoking or other inhaled environmental irritants; (5) nonasthmatic eosinophilic bronchitis; and (6) bronchiectasis.   These conditions, singly or in combination, have accounted for up to 94% of the causes of chronic cough in prospective studies.   Other conditions have constituted no >6% of the causes in prospective studies These have included bronchogenic carcinoma, chronic interstitial pneumonia, sarcoidosis, left ventricular failure, ACEI-induced cough, and aspiration from a condition associated with pharyngeal dysfunction.   .Chronic cough is often simultaneously caused by more than one condition. A single cause has been found from 38 to 82% of the time, multiple causes from 18 to 62%. Multiply caused cough has been the result of three diseases up to 42% of the time.    This is mostly likely acei related or excacerbated but not necessarily the cause - try off since also orthostatic symptoms and regroup in 3 weeks

## 2011-09-17 ENCOUNTER — Other Ambulatory Visit: Payer: Self-pay | Admitting: Internal Medicine

## 2011-09-19 NOTE — Telephone Encounter (Signed)
Gabapentin request [last refill 03.31.12 #[id number]]

## 2011-09-26 ENCOUNTER — Encounter: Payer: Self-pay | Admitting: Internal Medicine

## 2011-09-26 ENCOUNTER — Ambulatory Visit (INDEPENDENT_AMBULATORY_CARE_PROVIDER_SITE_OTHER): Payer: 59 | Admitting: Internal Medicine

## 2011-09-26 VITALS — BP 140/82 | HR 77 | Temp 98.1°F | Ht 66.5 in | Wt 199.8 lb

## 2011-09-26 DIAGNOSIS — R059 Cough, unspecified: Secondary | ICD-10-CM

## 2011-09-26 DIAGNOSIS — R05 Cough: Secondary | ICD-10-CM

## 2011-09-26 DIAGNOSIS — R053 Chronic cough: Secondary | ICD-10-CM

## 2011-09-26 DIAGNOSIS — R590 Localized enlarged lymph nodes: Secondary | ICD-10-CM

## 2011-09-26 DIAGNOSIS — R599 Enlarged lymph nodes, unspecified: Secondary | ICD-10-CM

## 2011-09-26 MED ORDER — FUROSEMIDE 20 MG PO TABS: 20.0000 mg | ORAL_TABLET | Freq: Every day | ORAL | Status: DC

## 2011-09-26 NOTE — Progress Notes (Signed)
  Subjective:    Patient ID: Jaime [name], female    DOB: Jan 24, 1956   MRN: [id number]  HPI  61 yobf never regular smoker with asthma as child but grew in elementary school and did not require inhlaler  But new onset sob 05/2011 referred 09/12/2011 to pulmonary clinic by Dr Debby Bud  09/12/2011 1st pulmonary eval on ACEI  cc mild dry cough x one year comes and goes with sensation of something stuck in throat better with ppi then developed multiple  spells x 15 min typically brought on by activity, assoc with sensation of weakness and can't get her breath, not necessarily proportionate to activity but increasing in frequency and may occur more often immediately p standing. No overt sinus or reflux symptoms at present, not better with otcs or inhalers. rec Stop vasoretic for now and let us know if your blood pressure when you take it at home is over 140/90     09/26/2011 f/u ov/Jeri Rawlins cc cough x ? A year, worse in am with green sputum only in am  and gen anterior ha's. Light headedness resolved off acei, cough intensity better. Sob no better. Not using ppi ac as rec   Sleeping ok without nocturnal  or early am exacerbation  of respiratory  c/o's or need for noct saba. Also denies any obvious fluctuation of symptoms with weather or environmental changes or other aggravating or alleviating factors except as outlined above     ROS  At present neg for  any significant sore throat, dysphagia, dental problems, itching, sneezing,  nasal congestion or excess/ purulent secretions, ear ache,   fever, chills, sweats, unintended wt loss, pleuritic or exertional cp, hemoptysis, palpitations, orthopnea pnd or leg swelling.  Also denies presyncope, palpitations, heartburn, abdominal pain, anorexia, nausea, vomiting, diarrhea  or change in bowel or urinary habits, change in stools or urine, dysuria,hematuria,  rash, arthralgias, visual complaints, headache, numbness weakness or ataxia or problems with walking or  coordination. No noted change in mood/affect or memory.                             Objective:   Physical Exam  09/12/2011  Wt 197 > 09/26/2011 199  amb bf no longer hoarse but still prominent pseudowheeze  HEENT: nl dentition, turbinates, and orophanx. Nl external ear canals without cough reflex   NECK :  without JVD/Nodes/TM/ nl carotid upstrokes bilaterally   LUNGS: no acc muscle use, clear to A and P bilaterally without cough on insp or exp maneuvers   CV:  RRR  no s3 or murmur or increase in P2, no edema   ABD:  soft and nontender with nl excursion in the supine position. No bruits or organomegaly, bowel sounds nl  MS:  warm without deformities, calf tenderness, cyanosis or clubbing  SKIN  No rash     cxr 08/30/11 Right hilar adenopathy.      Assessment & Plan:

## 2011-09-26 NOTE — Assessment & Plan Note (Signed)
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  Either way no benefit to any early invasive procedure, especially if we can correct all of her symptoms and establish stable pattern on cxr

## 2011-09-26 NOTE — Patient Instructions (Addendum)
Avoid salt and continue potassium daily  Furosemide 20 mg one each am  Protonix 40 mg Take 30-60 min before first meal of the day and add Pepcid 20 mg one at bedtime  Please see patient coordinator before you leave today  to schedule sinus ct   Please remember to go to the lab and x-ray department downstairs for your tests - we will call you with the results when they are available.  Please schedule a follow up office visit in 4 weeks, sooner if needed with cxr on return Late add needs labs on return

## 2011-09-26 NOTE — Assessment & Plan Note (Addendum)
Onset Jan '12   ace1 trial off 09/12/11 by Chantz Montefusco/ Pulmonary clinic Sinus ct 09/28/2011 > Normal paranasal sinuses.   Classic Upper airway cough syndrome, so named because it's frequently impossible to sort out how much is  CR/sinusitis with freq throat clearing (which can be related to primary GERD)   vs  causing  secondary (" extra esophageal")  GERD from wide swings in gastric pressure that occur with throat clearing, often  promoting self use of mint and menthol lozenges that reduce the lower esophageal sphincter tone and exacerbate the problem further in a cyclical fashion.   These are the same pts (now being labeled as having "irritable larynx syndrome" by some cough centers) who not infrequently have a history of having failed to tolerate ace inhibitors,  dry powder inhalers or biphosphonates or report having atypical reflux symptoms that don't respond to standard doses of PPI , and are easily confused as having aecopd or asthma flares by even experienced allergists/ pulmonologists.   For now leave off acei, max rx for GERD    sinus ct

## 2011-09-28 ENCOUNTER — Ambulatory Visit (INDEPENDENT_AMBULATORY_CARE_PROVIDER_SITE_OTHER)
Admission: RE | Admit: 2011-09-28 | Discharge: 2011-09-28 | Disposition: A | Discharge status: Home / Self Care | Payer: 59 | Source: Ambulatory Visit | Attending: Internal Medicine | Admitting: Internal Medicine

## 2011-09-28 ENCOUNTER — Encounter: Payer: Self-pay | Admitting: Internal Medicine

## 2011-09-28 DIAGNOSIS — R053 Chronic cough: Secondary | ICD-10-CM

## 2011-09-28 DIAGNOSIS — R059 Cough, unspecified: Secondary | ICD-10-CM

## 2011-09-28 DIAGNOSIS — R05 Cough: Secondary | ICD-10-CM

## 2011-09-28 NOTE — Progress Notes (Signed)
Quick Note:  Spoke with pt and notified of results per Dr. Wert. Pt verbalized understanding and denied any questions.  ______ 

## 2011-10-16 ENCOUNTER — Other Ambulatory Visit: Payer: Self-pay | Admitting: Internal Medicine

## 2011-10-24 ENCOUNTER — Ambulatory Visit (INDEPENDENT_AMBULATORY_CARE_PROVIDER_SITE_OTHER)
Admission: RE | Admit: 2011-10-24 | Discharge: 2011-10-24 | Disposition: A | Discharge status: Home / Self Care | Payer: 59 | Source: Ambulatory Visit | Attending: Internal Medicine | Admitting: Internal Medicine

## 2011-10-24 ENCOUNTER — Encounter: Payer: Self-pay | Admitting: Internal Medicine

## 2011-10-24 ENCOUNTER — Other Ambulatory Visit (INDEPENDENT_AMBULATORY_CARE_PROVIDER_SITE_OTHER): Payer: 59

## 2011-10-24 ENCOUNTER — Ambulatory Visit (INDEPENDENT_AMBULATORY_CARE_PROVIDER_SITE_OTHER): Payer: 59 | Admitting: Internal Medicine

## 2011-10-24 VITALS — BP 140/98 | HR 81 | Temp 98.1°F | Ht 66.0 in | Wt 202.2 lb

## 2011-10-24 DIAGNOSIS — R590 Localized enlarged lymph nodes: Secondary | ICD-10-CM

## 2011-10-24 DIAGNOSIS — R053 Chronic cough: Secondary | ICD-10-CM

## 2011-10-24 DIAGNOSIS — R599 Enlarged lymph nodes, unspecified: Secondary | ICD-10-CM

## 2011-10-24 DIAGNOSIS — R0600 Dyspnea, unspecified: Secondary | ICD-10-CM

## 2011-10-24 DIAGNOSIS — R05 Cough: Secondary | ICD-10-CM

## 2011-10-24 DIAGNOSIS — I10 Essential (primary) hypertension: Secondary | ICD-10-CM

## 2011-10-24 DIAGNOSIS — R059 Cough, unspecified: Secondary | ICD-10-CM

## 2011-10-24 DIAGNOSIS — R0609 Other forms of dyspnea: Secondary | ICD-10-CM

## 2011-10-24 DIAGNOSIS — R0989 Other specified symptoms and signs involving the circulatory and respiratory systems: Secondary | ICD-10-CM

## 2011-10-24 LAB — CBC WITH DIFFERENTIAL/PLATELET
Basophils Absolute: 0.1 10*3/uL (ref 0.0–0.1)
Lymphocytes Relative: 25.9 % (ref 12.0–46.0)
Lymphs Abs: 2.4 10*3/uL (ref 0.7–4.0)
Monocytes Relative: 7.4 % (ref 3.0–12.0)
Platelets: 269 10*3/uL (ref 150.0–400.0)
RDW: 17.5 % — ABNORMAL HIGH (ref 11.5–14.6)

## 2011-10-24 LAB — BASIC METABOLIC PANEL
BUN: 13 mg/dL (ref 6–23)
Calcium: 8.7 mg/dL (ref 8.4–10.5)
Creatinine, Ser: 0.9 mg/dL (ref 0.4–1.2)
GFR: 89.25 mL/min (ref >60.00)

## 2011-10-24 LAB — BRAIN NATRIURETIC PEPTIDE: Pro B Natriuretic peptide (BNP): 47 pg/mL (ref 0.0–100.0)

## 2011-10-24 LAB — TSH: TSH: 2.05 u[IU]/mL (ref 0.35–5.50)

## 2011-10-24 MED ORDER — PANTOPRAZOLE SODIUM 40 MG PO TBEC: DELAYED_RELEASE_TABLET | ORAL | Status: DC

## 2011-10-24 MED ORDER — PREDNISONE (PAK) 10 MG PO TABS: ORAL_TABLET | ORAL | Status: AC

## 2011-10-24 MED ORDER — TRAMADOL HCL 50 MG PO TABS: ORAL_TABLET | ORAL | Status: AC

## 2011-10-24 MED ORDER — NEBIVOLOL HCL 5 MG PO TABS: 5.0000 mg | ORAL_TABLET | Freq: Every day | ORAL | Status: DC

## 2011-10-24 NOTE — Assessment & Plan Note (Signed)
Dc ACEI due to intermittent / recurrrent cough 09/12/11  Not as well controlled off ace > rx bystolic 5 mg daily

## 2011-10-24 NOTE — Progress Notes (Signed)
Subjective:    Patient ID: Jaime [name], female    DOB: [birth year]   MRN: [id number]  HPI  29 yobf never regular smoker with asthma as child but grew in elementary school and did not require inhlaler  But new onset sob 05/2011 referred 09/12/2011 to pulmonary clinic by Dr Debby Bud  09/12/2011 1st pulmonary eval on ACEI  cc mild dry cough x one year comes and goes with sensation of something stuck in throat better with ppi then developed multiple  spells x 15 min typically brought on by activity, assoc with sensation of weakness and can't get her breath, not necessarily proportionate to activity but increasing in frequency and may occur more often immediately p standing. No overt sinus or reflux symptoms at present, not better with otcs or inhalers. rec Stop vasoretic for now and let us know if your blood pressure when you take it at home is over 140/90     09/26/2011 f/u ov/Madigan Rosensteel cc cough x ? A year, worse in am with green sputum only in am  and gen anterior ha's. Light headedness resolved off acei, cough intensity better. Sob no better. Not using ppi ac as rec rec Avoid salt and continue potassium daily Furosemide 20 mg one each am Protonix 40 mg Take 30-60 min before first meal of the day and add Pepcid 20 mg one at bedtime Please see patient coordinator before you leave today  to schedule sinus ct > nl Please remember to go to the lab and x-ray department downstairs for your tests > did not go for esr   10/24/2011 f/u ov/Kye Hedden cc cough ? Brought on by talking mostly dry and sob x 20-30 ft "every time she tries to walk" no excess mucus production, cough is mostly daytime, not following instructions re pepcid at hs.   Sleeping ok without nocturnal  or early am exacerbation  of respiratory  c/o's or need for noct saba. Also denies any obvious fluctuation of symptoms with weather or environmental changes or other aggravating or alleviating factors except as outlined above     ROS  At present neg  for  any significant sore throat, dysphagia, dental problems, itching, sneezing,  nasal congestion or excess/ purulent secretions, ear ache,   fever, chills, sweats, unintended wt loss, pleuritic or exertional cp, hemoptysis, palpitations, orthopnea pnd or leg swelling.  Also denies presyncope, palpitations, heartburn, abdominal pain, anorexia, nausea, vomiting, diarrhea  or change in bowel or urinary habits, change in stools or urine, dysuria,hematuria,  rash, arthralgias, visual complaints, headache, numbness weakness or ataxia or problems with walking or coordination. No noted change in mood/affect or memory.                             Objective:   Physical Exam  09/12/2011  Wt 197 > 09/26/2011 199> 10/24/2011   amb bf no longer hoarse,  No longer with pseudowheeze who   failed to answer a single question asked in a straightforward manner, tending to go off on tangents or answer questions with ambiguous medical terms or diagnoses and seemed aggravated  when asked the same question more than once for clarification.    HEENT: nl dentition, turbinates, and orophanx. Nl external ear canals without cough reflex   NECK :  without JVD/Nodes/TM/ nl carotid upstrokes bilaterally   LUNGS: no acc muscle use, clear to A and P bilaterally without cough on insp or exp maneuvers   CV:  RRR  no s3 or murmur or increase in P2, no edema   ABD:  soft and nontender with nl excursion in the supine position. No bruits or organomegaly, bowel sounds nl  MS:  warm without deformities, calf tenderness, cyanosis or clubbing  SKIN  No rash     CXR  10/24/2011 :  Rounded enlargement of inferior aspect of right hilar region. It is not definitely changed from prior chest radiographic examination. This could reflect hilar adenopathy or mass.       Assessment & Plan:

## 2011-10-24 NOTE — Patient Instructions (Addendum)
Take delsym two tsp every 12 hours and supplement if needed with  tramadol 50 mg up to 2 every 4 hours to suppress the urge to cough and even clear your throat. Swallowing water or using ice chips/non mint and menthol containing candies (such as lifesavers or sugarless jolly ranchers) are also effective.  You should rest your voice and avoid activities that you know make you cough.  Once you have eliminated the cough for 3 straight days try reducing the tramadol first,  then the delsym as tolerated.    Try Protonix 40 mg Take 30-60 min before first meal of the day and Pepcid 20 mg one bedtime perfectly regularly  GERD (REFLUX)  is an extremely common cause of respiratory symptoms, many times with no significant heartburn at all.    It can be treated with medication, but also with lifestyle changes including avoidance of late meals, excessive alcohol, smoking cessation, and avoid fatty foods, chocolate, peppermint, colas, red wine, and acidic juices such as orange juice.  NO MINT OR MENTHOL PRODUCTS SO NO COUGH DROPS  USE SUGARLESS CANDY INSTEAD (jolley ranchers or Stover's)  NO OIL BASED VITAMINS - use powdered substitutes.    Prednisone 10 mg take  4 each am x 2 days,   2 each am x 2 days,  1 each am x2days and stop   Bystolic 5 mg one daily until you see Norins but stop lasix if light headed when standing.  If not better in 2 weeks, call to schedule bronchoscopy (outpatient procedure, does not require putting you to sleep but limited to examination of the airways, not the nodes) with the option of seeing another doctor (Byrum)  to sample the lymph node but this requires putting you to sleep and in my opinion is not necessary at this point in your care.

## 2011-10-24 NOTE — Assessment & Plan Note (Signed)
Still most likely this is a form of  Classic Upper airway cough syndrome, so named because it's frequently impossible to sort out how much is  CR/sinusitis with freq throat clearing (which can be related to primary GERD)   vs  causing  secondary (" extra esophageal")  GERD from wide swings in gastric pressure that occur with throat clearing, often  promoting self use of mint and menthol lozenges that reduce the lower esophageal sphincter tone and exacerbate the problem further in a cyclical fashion.   These are the same pts (now being labeled as having "irritable larynx syndrome" by some cough centers) who not infrequently have a history of having failed to tolerate ace inhibitors,  dry powder inhalers or biphosphonates or report having atypical reflux symptoms that don't respond to standard doses of PPI , and are easily confused as having aecopd or asthma flares by even experienced allergists/ pulmonologists.  Key here is to first control cyclical coughing. See instructions for specific recommendations which were reviewed directly with the patient who was given a copy with highlighter outlining the key components.   NB .Chronic cough is often simultaneously caused by more than one condition. A single cause has been found from 38 to 82% of the time, multiple causes from 18 to 62%. Multiply caused cough has been the result of three diseases up to 42% of the time.    in addition, many patients with chronic cough are convinced there's something wrong with them and are hard to convince otherwise but I really don't thing fob will add anything but more irriation to already angry airway from cough trauma so rec no fob for now

## 2011-10-24 NOTE — Assessment & Plan Note (Addendum)
-   10/24/2011  Walked RA x 3 laps @ 185 ft each stopped due to end of study, no desats    Unable to reproduce this symptom at ov, consider cpst at some point if continues despite correction of cough

## 2011-10-24 NOTE — Assessment & Plan Note (Signed)
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  Either way no benefit to any early invasive procedure, especially if we can correct all of her symptoms, which remains to be seen.  I really think even open lung bx would show [place] anything which would change her treatment plan in any way so don't recommend invasive rx at this point.

## 2011-10-27 NOTE — Progress Notes (Signed)
Quick Note:  Spoke with pt and notified of results per Dr. Wert. Pt verbalized understanding and denied any questions.  ______ 

## 2011-11-07 ENCOUNTER — Encounter: Payer: Self-pay | Admitting: Internal Medicine

## 2011-11-07 ENCOUNTER — Telehealth: Payer: Self-pay | Admitting: Internal Medicine

## 2011-11-07 NOTE — Telephone Encounter (Signed)
Discussed in detail all the  indications, usual  risks and alternatives  relative to the benefits with patient who agrees to proceed with bronchoscopy with biopsy 11/10/11

## 2011-11-07 NOTE — Telephone Encounter (Signed)
Spoke with the pt and she states her cough is some improved,but is not gone. Pt would like to proceed with the bronch as mentioned in last OV note. Dr. Sherene Sires please advise. Carron Curie, CMA

## 2011-11-07 NOTE — Telephone Encounter (Signed)
Per OV note from 10/24/11 with Dr. Sherene Sires, pt was asked to:  If not better in 2 weeks, call to schedule bronchoscopy (outpatient procedure, does not require putting you to sleep but limited to examination of the airways, not the nodes) with the option of seeing another doctor (Byrum) to sample the lymph node but this requires putting you to sleep and in my opinion is not necessary at this point in your care.   ----    Called # provided above - went directly to VM - [email or web address]

## 2011-11-09 ENCOUNTER — Encounter (HOSPITAL_COMMUNITY): Payer: Self-pay | Admitting: Radiology

## 2011-11-10 ENCOUNTER — Ambulatory Visit (HOSPITAL_COMMUNITY): Payer: 59

## 2011-11-10 ENCOUNTER — Ambulatory Visit (HOSPITAL_COMMUNITY)
Admission: RE | Admit: 2011-11-10 | Discharge: 2011-11-10 | Disposition: A | Discharge status: Home / Self Care | Payer: 59 | Source: Ambulatory Visit | Attending: Internal Medicine | Admitting: Internal Medicine

## 2011-11-10 ENCOUNTER — Encounter (HOSPITAL_COMMUNITY): Payer: Self-pay | Admitting: Radiology

## 2011-11-10 ENCOUNTER — Encounter (HOSPITAL_COMMUNITY)
Admission: RE | Disposition: A | Discharge status: Home / Self Care | Payer: Self-pay | Source: Ambulatory Visit | Attending: Internal Medicine

## 2011-11-10 DIAGNOSIS — R059 Cough, unspecified: Secondary | ICD-10-CM

## 2011-11-10 DIAGNOSIS — Z79899 Other long term (current) drug therapy: Secondary | ICD-10-CM | POA: Insufficient documentation

## 2011-11-10 DIAGNOSIS — R0602 Shortness of breath: Secondary | ICD-10-CM | POA: Insufficient documentation

## 2011-11-10 DIAGNOSIS — R05 Cough: Secondary | ICD-10-CM

## 2011-11-10 DIAGNOSIS — R599 Enlarged lymph nodes, unspecified: Secondary | ICD-10-CM

## 2011-11-10 DIAGNOSIS — I10 Essential (primary) hypertension: Secondary | ICD-10-CM | POA: Insufficient documentation

## 2011-11-10 DIAGNOSIS — R590 Localized enlarged lymph nodes: Secondary | ICD-10-CM

## 2011-11-10 DIAGNOSIS — R0609 Other forms of dyspnea: Secondary | ICD-10-CM | POA: Insufficient documentation

## 2011-11-10 DIAGNOSIS — R42 Dizziness and giddiness: Secondary | ICD-10-CM | POA: Insufficient documentation

## 2011-11-10 DIAGNOSIS — R0989 Other specified symptoms and signs involving the circulatory and respiratory systems: Secondary | ICD-10-CM | POA: Insufficient documentation

## 2011-11-10 DIAGNOSIS — R053 Chronic cough: Secondary | ICD-10-CM

## 2011-11-10 DIAGNOSIS — R6889 Other general symptoms and signs: Secondary | ICD-10-CM | POA: Insufficient documentation

## 2011-11-10 HISTORY — PX: VIDEO BRONCHOSCOPY: SHX5072

## 2011-11-10 SURGERY — BRONCHOSCOPY, WITH FLUOROSCOPY
Anesthesia: Moderate Sedation | Laterality: Bilateral

## 2011-11-10 MED ORDER — MEPERIDINE HCL 25 MG/ML IJ SOLN
INTRAMUSCULAR | Status: DC | PRN
  Administered 2011-11-10: 50 mg via INTRAVENOUS

## 2011-11-10 MED ORDER — MIDAZOLAM HCL 10 MG/2ML IJ SOLN
INTRAMUSCULAR | Status: DC | PRN
  Administered 2011-11-10 (×2): 2.5 mg via INTRAVENOUS

## 2011-11-10 MED ORDER — MEPERIDINE HCL 100 MG/ML IJ SOLN
INTRAMUSCULAR | Status: AC
  Filled 2011-11-10: qty 2

## 2011-11-10 MED ORDER — SODIUM CHLORIDE 0.9 % IV SOLN: INTRAVENOUS | Status: DC

## 2011-11-10 MED ORDER — MIDAZOLAM HCL 10 MG/2ML IJ SOLN
INTRAMUSCULAR | Status: AC
  Filled 2011-11-10: qty 4

## 2011-11-10 MED ORDER — PHENYLEPHRINE HCL 0.25 % NA SOLN
NASAL | Status: DC | PRN
  Administered 2011-11-10: 1

## 2011-11-10 MED ORDER — LIDOCAINE HCL 2 % EX GEL
CUTANEOUS | Status: DC | PRN
  Administered 2011-11-10: 1

## 2011-11-10 MED ORDER — LIDOCAINE HCL 1 % IJ SOLN
INTRAMUSCULAR | Status: DC | PRN
  Administered 2011-11-10: 6 mL via RESPIRATORY_TRACT

## 2011-11-10 MED ORDER — PHENYLEPHRINE HCL 0.25 % NA SOLN
1.0000 | Freq: Four times a day (QID) | NASAL | Status: DC | PRN
  Filled 2011-11-10: qty 15

## 2011-11-10 MED ORDER — LIDOCAINE HCL 2 % EX GEL: Freq: Once | CUTANEOUS | Status: DC

## 2011-11-10 NOTE — Op Note (Signed)
Bronchoscopy Procedure Note  Date of Operation: 11/10/2011  Pre-op Diagnosis: cough ? sarcoid  Post-op Diagnosis: same  Surgeon: Sandrea Hughs  Anesthesia: Monitored Local Anesthesia with Sedation  Operation: Video Flexible fiberoptic bronchoscopy, diagnostic   Findings: Nl airways  Specimen: BAL RML/ TBBX RLL  Estimated Blood Loss: Minimal  Complications: None  Indications and History: The patient is a 56 y.o. female with chronic cough and R hilar adenopathy.  The risks, benefits, complications, treatment options and expected outcomes were discussed with the patient.  The possibilities of reaction to medication, pulmonary aspiration, perforation of a viscus, bleeding, failure to diagnose a condition and creating a complication requiring transfusion or operation were discussed with the patient who freely signed the consent.    Description of Procedure: The patient was re-examined in the bronchoscopy suite and the site of surgery properly noted/marked.  The patient was identified as Jaime [name] and the procedure verified as Flexible Fiberoptic Bronchoscopy.  A Time Out was held and the above information confirmed.   After the induction of topical nasopharyngeal anesthesia, the patient was positioned  and the bronchoscope was passed through the Left naris. The vocal cords were visualized and  1% buffered lidocaine 5 ml was topically placed onto the cords. The cords were nl. The scope was then passed into the trachea.  1% buffered lidocaine 5 ml was used topically on the carina.  Careful inspection of the tracheal lumen was accomplished. The scope was sequentially passed into all airways to the subsegmental level  Findings: Airways were all nl, no cobblestoning or increased secretions.  Procedures BAL RML > sent for cytology  TBBX RLL > sent for histology  The Patient was taken to the Endoscopy Recovery area in satisfactory condition.  Attestation: I performed the  procedure.  Sandrea Hughs, MD Pulmonary and Critical Care Medicine [facility] Cell [phone]

## 2011-11-10 NOTE — Progress Notes (Signed)
Checked pulse Ox probe

## 2011-11-10 NOTE — Progress Notes (Signed)
Changed to RA at [facility]

## 2011-11-10 NOTE — Discharge Instructions (Signed)
Bronchoscopy Care After These instructions give you information on caring for yourself after your procedure. Your doctor may also give you specific instructions. Call your doctor if you have any problems or questions after your procedure. HOME CARE  Do not eat or drink anything for 2 hours after your test. Your nose and throat was numbed by medicine. If you try to eat or drink before the medicine wears off, food or drink could go into your lungs.   For the rest of the first day, eat soft food and drink liquids slowly.   On the day after the test, you can go back to eating your usual food.   Do not drive or sign important papers the day of the test.   Take it easy for the next 2 days. Do not do any heavy work, exercise, or activities.   Only take medicine as told by your doctor. Do not take aspirin.   You may be drowsy for the next 24 hours.   You may see traces of blood in your spit for 1 to 2 days.  Finding out the results of your test Ask when your test results will be ready. Make sure you get your test results. GET HELP RIGHT AWAY IF:  You have breathing problems.   You have a bad sore throat for more than 1 week.   You see traces of blood in your spit for more than 3 days.   You start coughing up blood.   You have a temperature of 102 F (38.9 C) or higher.  MAKE SURE YOU:  Understand these instructions.   Will watch your condition.   Will get help right away if you are not doing well or get worse.  Document Released: 04/03/2009 Document Revised: 05/26/2011 Document Reviewed: 04/03/2009 [facility] Patient Information 2012 [place], [place].  Resume Aspirin Sunday May 26  I will call you with the microscopic findings when available

## 2011-11-10 NOTE — Progress Notes (Signed)
0842 decreased to 2l [place]

## 2011-11-10 NOTE — Progress Notes (Signed)
Video Bronchoscopy  Procedure tolerated well Intervention bronchial biopsy done Intervention bronchial washing done

## 2011-11-10 NOTE — H&P (Signed)
56 yobf never regular smoker with asthma as child but grew in elementary school and did not require inhlaler But new onset sob 05/2011 referred 09/12/2011 to pulmonary clinic by Dr Debby Bud  09/12/2011 1st pulmonary eval on ACEI cc mild dry cough x one year comes and goes with sensation of something stuck in throat better with ppi then developed multiple spells x 15 min typically brought on by activity, assoc with sensation of weakness and can't get her breath, not necessarily proportionate to activity but increasing in frequency and may occur more often immediately p standing. No overt sinus or reflux symptoms at present, not better with otcs or inhalers.  rec  Stop vasoretic for now and let us know if your blood pressure when you take it at home is over 140/90  09/26/2011 f/u ov/Zenora Karpel cc cough x ? A year, worse in am with green sputum only in am and gen anterior ha's. Light headedness resolved off acei, cough intensity better. Sob no better. Not using ppi ac as rec  rec  Avoid salt and continue potassium daily  Furosemide 20 mg one each am  Protonix 40 mg Take 30-60 min before first meal of the day and add Pepcid 20 mg one at bedtime  Please see patient coordinator before you leave today to schedule sinus ct > nl  Please remember to go to the lab and x-ray department downstairs for your tests > did not go for esr  10/24/2011 f/u ov/Sehaj Mcenroe cc cough ? Brought on by talking mostly dry and sob x 20-30 ft "every time she tries to walk" no excess mucus production, cough is mostly daytime, not following instructions re pepcid at hs.  Sleeping ok without nocturnal or early am exacerbation of respiratory c/o's or need for noct saba. Also denies any obvious fluctuation of symptoms with weather or environmental changes or other aggravating or alleviating factors except as outlined above  ROS At present neg for any significant sore throat, dysphagia, dental problems, itching, sneezing, nasal congestion or excess/ purulent  secretions, ear ache, fever, chills, sweats, unintended wt loss, pleuritic or exertional cp, hemoptysis, palpitations, orthopnea pnd or leg swelling. Also denies presyncope, palpitations, heartburn, abdominal pain, anorexia, nausea, vomiting, diarrhea or change in bowel or urinary habits, change in stools or urine, dysuria,hematuria, rash, arthralgias, visual complaints, headache, numbness weakness or ataxia or problems with walking or coordination. No noted change in mood/affect or memory.  Objective:   Physical Exam  09/12/2011 Wt 197 > 09/26/2011 199> 10/24/2011  amb bf no longer hoarse, No longer with pseudowheeze who failed to answer a single question asked in a straightforward manner, tending to go off on tangents or answer questions with ambiguous medical terms or diagnoses and seemed aggravated when asked the same question more than once for clarification.  HEENT: nl dentition, turbinates, and orophanx. Nl external ear canals without cough reflex  NECK : without JVD/Nodes/TM/ nl carotid upstrokes bilaterally  LUNGS: no acc muscle use, clear to A and P bilaterally without cough on insp or exp maneuvers  CV: RRR no s3 or murmur or increase in P2, no edema  ABD: soft and nontender with nl excursion in the supine position. No bruits or organomegaly, bowel sounds nl  MS: warm without deformities, calf tenderness, cyanosis or clubbing  SKIN No rash  CXR 10/24/2011 :  Rounded enlargement of inferior aspect of right hilar region. It is not definitely changed from prior chest radiographic examination. This could reflect hilar adenopathy or mass.  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  These are the same pts (now being labeled as having "irritable larynx syndrome" by some cough centers) who not infrequently have a history of having failed to tolerate ace inhibitors, dry powder inhalers or biphosphonates or report having atypical reflux symptoms that don't respond to standard doses of PPI , and are easily confused as having aecopd or asthma flares by even experienced allergists/ pulmonologists.  Key here is to first control cyclical coughing. See instructions for specific recommendations which were reviewed directly with the patient who was given a copy with highlighter outlining the key components.  NB .Chronic cough is often simultaneously caused by more than one condition. A single cause has been found from 38 to 82% of the time, multiple causes from 18 to 62%. Multiply caused cough has been the result of three diseases up to 42% of the time.  in addition, many patients with chronic cough are convinced there's something wrong with them and are hard to convince otherwise but I really don't thing fob will add anything but more irriation to already angry airway from cough trauma so rec no fob for now  11/10/2011   Pt now requesting fob despite known risk of making the cough worse. Discussed in detail all the  indications, usual  risks and alternatives  relative to the benefits with patient who agrees to proceed with bronchoscopy with biopsy.   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  Either way no benefit to any early invasive procedure, especially if we can correct all of her symptoms, which remains to be seen.  I really think even open lung bx would show [place] anything which would change her treatment plan in any way so don't recommend invasive rx at this point. ESSENTIAL HYPERTENSION - Sandrea Hughs, MD 10/24/2011 4:54 PM Signed  Dc ACEI due to intermittent / recurrrent cough 09/12/11  Not as well controlled off ace > rx bystolic 5 mg daily   Sandrea Hughs, MD Pulmonary and Critical Care Medicine [facility] [phone]

## 2011-11-11 ENCOUNTER — Encounter (HOSPITAL_COMMUNITY): Payer: Self-pay

## 2011-11-11 ENCOUNTER — Encounter (HOSPITAL_COMMUNITY): Payer: Self-pay | Admitting: Internal Medicine

## 2011-11-16 ENCOUNTER — Encounter: Payer: Self-pay | Admitting: Internal Medicine

## 2011-11-24 ENCOUNTER — Ambulatory Visit: Payer: 59 | Admitting: Internal Medicine

## 2011-12-01 ENCOUNTER — Ambulatory Visit: Payer: 59 | Admitting: Internal Medicine

## 2011-12-08 ENCOUNTER — Encounter: Payer: Self-pay | Admitting: Internal Medicine

## 2011-12-08 ENCOUNTER — Ambulatory Visit (INDEPENDENT_AMBULATORY_CARE_PROVIDER_SITE_OTHER): Payer: 59 | Admitting: Internal Medicine

## 2011-12-08 VITALS — BP 142/100 | HR 72 | Temp 98.4°F | Resp 16 | Ht 66.0 in | Wt 201.0 lb

## 2011-12-08 DIAGNOSIS — R053 Chronic cough: Secondary | ICD-10-CM

## 2011-12-08 DIAGNOSIS — I10 Essential (primary) hypertension: Secondary | ICD-10-CM

## 2011-12-08 DIAGNOSIS — K279 Peptic ulcer, site unspecified, unspecified as acute or chronic, without hemorrhage or perforation: Secondary | ICD-10-CM

## 2011-12-08 DIAGNOSIS — R05 Cough: Secondary | ICD-10-CM

## 2011-12-08 DIAGNOSIS — R059 Cough, unspecified: Secondary | ICD-10-CM

## 2011-12-08 DIAGNOSIS — R599 Enlarged lymph nodes, unspecified: Secondary | ICD-10-CM

## 2011-12-08 DIAGNOSIS — D509 Iron deficiency anemia, unspecified: Secondary | ICD-10-CM

## 2011-12-08 DIAGNOSIS — R590 Localized enlarged lymph nodes: Secondary | ICD-10-CM

## 2011-12-08 NOTE — Progress Notes (Signed)
Subjective:    Patient ID: Jaime [name], female    DOB: March 10, 1956, 56 y.o.   MRN: [id number]  HPI Jaime [name] has been seeing Dr. Sherene Sires and had bronchoscopy which was unrevealing. She has had stable hilar lymphadenopathy by CT scan. ACE level 08/31/11 was <1 - negative. She does continue to have a cough but it is improved. She denies any significant DOE.  Past Medical History  Diagnosis Date  . PVD (peripheral vascular disease)   . Migraine   . Meningioma   . 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 Nontoxic multinodular goiter   . Peptic ulcer disease    Past Surgical History  Procedure Date  . Tubal ligation   . Tonsillectomy 1965  . Ovarian cyst removal   . Orif ankle fracture     right  . Trigger finger release 2012    right thumb  . Video bronchoscopy 11/10/2011    Procedure: VIDEO BRONCHOSCOPY WITH FLUORO;  Surgeon: Nyoka Cowden, MD;  Location: [facility];  Service: Cardiopulmonary;  Laterality: Bilateral;   Family History  Problem Relation Age of Onset  . Hypertension Mother   . Diabetes Mother   . Esophageal cancer Father     was a smoker  . Breast cancer Neg Hx   . Coronary artery disease Neg Hx   . Colon cancer Maternal Grandmother    History   Social History  . Marital Status: Married    Spouse Name: N/A    Number of Children: 1  . Years of Education: N/A   Occupational History  . 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 Smoking status: Former Smoker -- 0.3 packs/day for 3 years    Types: Cigarettes    Quit date: 06/20/1981  . Smokeless tobacco: Never Used  . Alcohol Use: No  . Drug Use: No  . Sexually Active: Not on file   Other Topics Concern  . Not on file   Social History Narrative   HSG-[employer], 2 years at [employer]  Married 1989 - marriage is ok.  Daughter - 1979, 2 step-sons; 2 grand-children. 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     Current Outpatient Prescriptions on File Prior to Visit  Medication Sig Dispense Refill  . furosemide (LASIX) 20 MG tablet Take 1 tablet (20 mg total) by mouth daily.  30 tablet  11  . gabapentin (NEURONTIN) 100 MG capsule Take 100 mg by mouth 3 (three) times daily.      . Multiple Vitamins-Minerals (MULTIVITAMINS THER. W/MINERALS) TABS Take 1 tablet by mouth daily.      . pantoprazole (PROTONIX) 40 MG tablet Take 30-60 min before first meal of the day      . potassium chloride (K-DUR) 10 MEQ tablet Take 10 mEq by mouth 2 (two) times daily as needed. For cramps      . nebivolol (BYSTOLIC) 5 MG tablet Take 1 tablet (5 mg total) by mouth daily.  30 tablet    . DISCONTD: enalapril (VASOTEC) 10 MG tablet For blood pressure   5 tablet  0  . DISCONTD: ferrous gluconate (FERGON) 325 MG tablet Take 325 mg by mouth daily.        Marland Kitchen DISCONTD: hydrochlorothiazide (MICROZIDE) 12.5 MG capsule Take 1 capsule (12.5 mg total) by mouth daily.  30 capsule  11  . DISCONTD: zolpidem (AMBIEN)  10 MG tablet Take 10 mg by mouth at bedtime as needed.            Review of Systems System review is negative for any constitutional, cardiac, pulmonary, GI or neuro symptoms or complaints other than as described in the HPI.     Objective:   Physical Exam Filed Vitals:   12/08/11 1015  BP: 142/100  Pulse: 72  Temp: 98.4 F (36.9 C)  Resp: 16   Cor- RRR Pulm - normal respirations w/o wheezing, w/o SOB Neuro - A&O x 3       Assessment & Plan:

## 2011-12-10 NOTE — Assessment & Plan Note (Signed)
No c/o dyspepsia. She has continued on PPI therapy.  Plan No change in regimen

## 2011-12-10 NOTE — Assessment & Plan Note (Signed)
Asymptomatic. On PPI therapy.  Plan  Repeat IBC panel at next lab.

## 2011-12-10 NOTE — Assessment & Plan Note (Signed)
BP Readings from Last 3 Encounters:  12/08/11 142/100  11/10/11 115/69  11/10/11 115/69   ACE-I stopped as possible cause of cough - no real change in cough. She was started on bystolic and has had pretty good readings. At today's visit she has not had bystolic for several days - ran out.  Plan -  Resume bystolic, continue diuretic  Monitor BP at home and report back

## 2011-12-10 NOTE — Assessment & Plan Note (Signed)
Discussed results to date and reassured her that todate no evidence to suggest that the hilar adenopathy has any pathologic significance.

## 2011-12-10 NOTE — Assessment & Plan Note (Signed)
Dr. Thurston Hole notes reviewed. FOB report reviewed. Patient's symptoms are better  Plan No change in regimen.

## 2011-12-26 ENCOUNTER — Encounter: Payer: Self-pay | Admitting: Internal Medicine

## 2011-12-26 ENCOUNTER — Ambulatory Visit (INDEPENDENT_AMBULATORY_CARE_PROVIDER_SITE_OTHER): Payer: 59 | Admitting: Internal Medicine

## 2011-12-26 ENCOUNTER — Other Ambulatory Visit (INDEPENDENT_AMBULATORY_CARE_PROVIDER_SITE_OTHER): Payer: 59

## 2011-12-26 VITALS — BP 138/90 | HR 93 | Temp 98.2°F | Ht 66.0 in | Wt 201.0 lb

## 2011-12-26 DIAGNOSIS — R0609 Other forms of dyspnea: Secondary | ICD-10-CM

## 2011-12-26 DIAGNOSIS — D509 Iron deficiency anemia, unspecified: Secondary | ICD-10-CM

## 2011-12-26 DIAGNOSIS — R599 Enlarged lymph nodes, unspecified: Secondary | ICD-10-CM

## 2011-12-26 DIAGNOSIS — R590 Localized enlarged lymph nodes: Secondary | ICD-10-CM

## 2011-12-26 DIAGNOSIS — R053 Chronic cough: Secondary | ICD-10-CM

## 2011-12-26 DIAGNOSIS — R0600 Dyspnea, unspecified: Secondary | ICD-10-CM

## 2011-12-26 DIAGNOSIS — R059 Cough, unspecified: Secondary | ICD-10-CM

## 2011-12-26 DIAGNOSIS — R0989 Other specified symptoms and signs involving the circulatory and respiratory systems: Secondary | ICD-10-CM

## 2011-12-26 DIAGNOSIS — D649 Anemia, unspecified: Secondary | ICD-10-CM

## 2011-12-26 DIAGNOSIS — R05 Cough: Secondary | ICD-10-CM

## 2011-12-26 LAB — CBC WITH DIFFERENTIAL/PLATELET
Eosinophils Relative: 5.9 % — ABNORMAL HIGH (ref 0.0–5.0)
Lymphocytes Relative: 23.4 % (ref 12.0–46.0)
MCV: 82.1 fl (ref 78.0–100.0)
Monocytes Absolute: 0.7 10*3/uL (ref 0.1–1.0)
Neutrophils Relative %: 62 % (ref 43.0–77.0)
Platelets: 254 10*3/uL (ref 150.0–400.0)
WBC: 9.1 10*3/uL (ref 4.5–10.5)

## 2011-12-26 LAB — SEDIMENTATION RATE: Sed Rate: 45 mm/hr — ABNORMAL HIGH (ref 0–22)

## 2011-12-26 LAB — IBC PANEL: Iron: 33 ug/dL — ABNORMAL LOW (ref 42–145)

## 2011-12-26 NOTE — Progress Notes (Signed)
Quick Note:  Spoke with pt and notified of results per Dr. Wert. Pt verbalized understanding and denied any questions.  ______ 

## 2011-12-26 NOTE — Patient Instructions (Addendum)
Please remember to go to the lab  department downstairs for your tests - we will call you with the results when they are available.  Please schedule a follow up visit in 2 months but call sooner if needed with CXR and PFT's

## 2011-12-26 NOTE — Progress Notes (Signed)
Subjective:    Patient ID: Jaime [name], female    DOB: [birth year]   MRN: [id number]  HPI  8 yobf never regular smoker with asthma as child but grew in elementary school and did not require inhlaler  But new onset sob 05/2011 referred 09/12/2011 to pulmonary clinic by Dr Debby Bud  09/12/2011 1st pulmonary eval on ACEI  cc mild dry cough x one year comes and goes with sensation of something stuck in throat better with ppi then developed multiple  spells x 15 min typically brought on by activity, assoc with sensation of weakness and can't get her breath, not necessarily proportionate to activity but increasing in frequency and may occur more often immediately p standing. No overt sinus or reflux symptoms at present, not better with otcs or inhalers. rec Stop vasoretic for now and let us know if your blood pressure when you take it at home is over 140/90     09/26/2011 f/u ov/Jaime [name] cc cough x ? A year, worse in am with green sputum only in am  and gen anterior ha's. Light headedness resolved off acei, cough intensity better. Sob no better. Not using ppi ac as rec rec Avoid salt and continue potassium daily Furosemide 20 mg one each am Protonix 40 mg Take 30-60 min before first meal of the day and add Pepcid 20 mg one at bedtime Please see patient coordinator before you leave today  to schedule sinus ct > nl Please remember to go to the lab and x-ray department downstairs for your tests > did not go for esr   10/24/2011 f/u ov/Jaime [name] cc cough ? Brought on by talking mostly dry and sob x 20-30 ft "every time she tries to walk" no excess mucus production, cough is mostly daytime, not following instructions re pepcid at hs. rec Take delsym two tsp every 12 hours and supplement if needed with  tramadol 50 mg up to 2 every 4 hours to suppress the urge to cough and even clear your throat. Try Protonix 40 mg Take 30-60 min before first meal of the day and Pepcid 20 mg one bedtime perfectly regularly GERD  diet Prednisone 10 mg take  4 each am x 2 days,   2 each am x 2 days,  1 each am x2days and stop  Bystolic 5 mg one daily until you see Norins but stop lasix if light headed when standing. If not better in 2 weeks, call to schedule bronchoscopy  FOB done 5/23 with nl airways, neg TBBX  12/26/2011 f/u ov/Jaime [name] cc still some dry cough brought on by talking, no trouble sleeping, still feel a tickle in throat. Also c/o doe x exertion like steps, not variable. No overt sinus or hb symptoms.   Sleeping ok without nocturnal  or early am exacerbation  of respiratory  c/o's or need for noct saba. Also denies any obvious fluctuation of symptoms with weather or environmental changes or other aggravating or alleviating factors except as outlined above     ROS  At present neg for  any significant sore throat, dysphagia, dental problems, itching, sneezing,  nasal congestion or excess/ purulent secretions, ear ache,   fever, chills, sweats, unintended wt loss, pleuritic or exertional cp, hemoptysis, palpitations, orthopnea pnd or leg swelling.  Also denies presyncope, palpitations, heartburn, abdominal pain, anorexia, nausea, vomiting, diarrhea  or change in bowel or urinary habits, change in stools or urine, dysuria,hematuria,  rash, arthralgias, visual complaints, headache, numbness weakness or ataxia or problems with walking  or coordination. No noted change in mood/affect or memory.                             Objective:   Physical Exam  09/12/2011  Wt 197 > 09/26/2011 199>  12/26/2011  201  amb bf no longer hoarse,  No longer with pseudowheeze who   failed to answer a single question asked in a straightforward manner, tending to go off on tangents or answer questions with ambiguous medical terms or diagnoses and seemed aggravated  when asked the same question more than once for clarification.    HEENT: nl dentition, turbinates, and orophanx. Nl external ear canals without cough reflex   NECK :   without JVD/Nodes/TM/ nl carotid upstrokes bilaterally   LUNGS: no acc muscle use, clear to A and P bilaterally without cough on insp or exp maneuvers   CV:  RRR  no s3 or murmur or increase in P2, no edema   ABD:  soft and nontender with nl excursion in the supine position. No bruits or organomegaly, bowel sounds nl  MS:  warm without deformities, calf tenderness, cyanosis or clubbing  SKIN  No rash     CXR  10/24/2011 :  Rounded enlargement of inferior aspect of right hilar region. It is not definitely changed from prior chest radiographic examination. This could reflect hilar adenopathy or mass.       Assessment & Plan:

## 2011-12-27 ENCOUNTER — Telehealth: Payer: Self-pay | Admitting: Internal Medicine

## 2011-12-27 NOTE — Telephone Encounter (Signed)
Patient notified of message  Of blood pressure . Left message on voice mail

## 2011-12-27 NOTE — Assessment & Plan Note (Signed)
Symptoms are markedly disproportionate to objective findings and not clear this is a lung problem but pt does appear to have difficult airway management issues.   DDX of  difficult airways managment all start with A and  include Adherence, Ace Inhibitors, Acid Reflux, Active Sinus Disease, Alpha 1 Antitripsin deficiency, Anxiety masquerading as Airways dz,  ABPA,  allergy(esp in young), Aspiration (esp in elderly), Adverse effects of DPI,  Active smokers, plus two Bs  = Bronchiectasis and Beta blocker use..and one C= CHF   Acid reflux vs Anxiety are the leading suspects here but do not require further escalation of care based on severity.  See instructions for specific recommendations which were reviewed directly with the patient who was given a copy with highlighter outlining the key components.

## 2011-12-27 NOTE — Assessment & Plan Note (Signed)
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

## 2011-12-27 NOTE — Telephone Encounter (Signed)
Please call pateint: blood pressures look better. Continue with the present medications.

## 2011-12-27 NOTE — Assessment & Plan Note (Signed)
Onset Jan '12     trial off  ACEI 09/12/11 by Wert/ Pulmonary clinic Sinus ct 09/28/2011 > Normal paranasal sinuses.  This is  Classic Upper airway cough syndrome, so named because it's frequently impossible to sort out how much is  CR/sinusitis with freq throat clearing (which can be related to primary GERD)   vs  causing  secondary (" extra esophageal")  GERD from wide swings in gastric pressure that occur with throat clearing, often  promoting self use of mint and menthol lozenges that reduce the lower esophageal sphincter tone and exacerbate the problem further in a cyclical fashion.   These are the same pts (now being labeled as having "irritable larynx syndrome" by some cough centers) who not infrequently have a history of having failed to tolerate ace inhibitors,  dry powder inhalers or biphosphonates or report having atypical reflux symptoms that don't respond to standard doses of PPI , and are easily confused as having aecopd or asthma flares by even experienced allergists/ pulmonologists.   For now continue rx for gerd/ really nothing else to offer here.

## 2011-12-30 ENCOUNTER — Other Ambulatory Visit: Payer: Self-pay

## 2011-12-30 MED ORDER — NEBIVOLOL HCL 5 MG PO TABS: 5.0000 mg | ORAL_TABLET | Freq: Every day | ORAL | Status: DC

## 2011-12-30 NOTE — Telephone Encounter (Signed)
Pt called requesting refill of BP medications per last OV with MEN.

## 2012-01-13 ENCOUNTER — Other Ambulatory Visit: Payer: Self-pay | Admitting: Internal Medicine

## 2012-01-16 ENCOUNTER — Telehealth: Payer: Self-pay | Admitting: *Deleted

## 2012-01-16 ENCOUNTER — Other Ambulatory Visit: Payer: Self-pay | Admitting: Obstetrics and Gynecology

## 2012-01-16 DIAGNOSIS — Z1231 Encounter for screening mammogram for malignant neoplasm of breast: Secondary | ICD-10-CM

## 2012-01-16 NOTE — Telephone Encounter (Signed)
Ok to wait until pft ov for cxr  03/05/12

## 2012-01-16 NOTE — Telephone Encounter (Signed)
Message copied by Christen Butter on Mon Jan 16, 2012 12:14 PM ------      Message from: Sandrea Hughs B      Created: Wed Nov 16, 2011  9:03 AM       Needs f/u ov with cxr by now

## 2012-01-16 NOTE — Telephone Encounter (Signed)
The pt has rov with PFT scheduled for 03-05-12-is this okay or does she need to be moved? Pls advise thanks

## 2012-02-02 ENCOUNTER — Ambulatory Visit (HOSPITAL_COMMUNITY)
Admission: RE | Admit: 2012-02-02 | Discharge: 2012-02-02 | Disposition: A | Discharge status: Home / Self Care | Payer: 59 | Source: Ambulatory Visit | Attending: Obstetrics and Gynecology | Admitting: Obstetrics and Gynecology

## 2012-02-02 DIAGNOSIS — Z1231 Encounter for screening mammogram for malignant neoplasm of breast: Secondary | ICD-10-CM

## 2012-02-28 ENCOUNTER — Telehealth: Payer: Self-pay

## 2012-02-28 MED ORDER — ATENOLOL 50 MG PO TABS: 50.0000 mg | ORAL_TABLET | Freq: Every day | ORAL | Status: DC

## 2012-02-28 NOTE — Telephone Encounter (Signed)
May stop bystolic and start atenolol 50 mg once a day - cash price is $36/ month but it is a generic 1 st tier drug. BP check in 3 weeks.

## 2012-02-28 NOTE — Telephone Encounter (Signed)
Pt called requesting cheaper alternative to current BP medication ($35/month), please advise.

## 2012-02-28 NOTE — Telephone Encounter (Signed)
Patient notified of medication cost and called in to [facility]

## 2012-03-05 ENCOUNTER — Encounter: Payer: Self-pay | Admitting: Internal Medicine

## 2012-03-05 ENCOUNTER — Ambulatory Visit (INDEPENDENT_AMBULATORY_CARE_PROVIDER_SITE_OTHER): Payer: 59 | Admitting: Internal Medicine

## 2012-03-05 ENCOUNTER — Other Ambulatory Visit (INDEPENDENT_AMBULATORY_CARE_PROVIDER_SITE_OTHER): Payer: 59

## 2012-03-05 ENCOUNTER — Ambulatory Visit (INDEPENDENT_AMBULATORY_CARE_PROVIDER_SITE_OTHER)
Admission: RE | Admit: 2012-03-05 | Discharge: 2012-03-05 | Disposition: A | Discharge status: Home / Self Care | Payer: 59 | Source: Ambulatory Visit | Attending: Internal Medicine | Admitting: Internal Medicine

## 2012-03-05 VITALS — BP 130/78 | HR 68 | Temp 98.2°F | Ht 66.0 in | Wt 198.0 lb

## 2012-03-05 DIAGNOSIS — R05 Cough: Secondary | ICD-10-CM

## 2012-03-05 DIAGNOSIS — R599 Enlarged lymph nodes, unspecified: Secondary | ICD-10-CM

## 2012-03-05 DIAGNOSIS — R059 Cough, unspecified: Secondary | ICD-10-CM

## 2012-03-05 DIAGNOSIS — D509 Iron deficiency anemia, unspecified: Secondary | ICD-10-CM

## 2012-03-05 DIAGNOSIS — R053 Chronic cough: Secondary | ICD-10-CM

## 2012-03-05 DIAGNOSIS — R590 Localized enlarged lymph nodes: Secondary | ICD-10-CM

## 2012-03-05 DIAGNOSIS — Z23 Encounter for immunization: Secondary | ICD-10-CM

## 2012-03-05 DIAGNOSIS — R0600 Dyspnea, unspecified: Secondary | ICD-10-CM

## 2012-03-05 DIAGNOSIS — R0609 Other forms of dyspnea: Secondary | ICD-10-CM

## 2012-03-05 LAB — CBC WITH DIFFERENTIAL/PLATELET
Eosinophils Absolute: 0.4 10*3/uL (ref 0.0–0.7)
Eosinophils Relative: 4.3 % (ref 0.0–5.0)
MCHC: 31.3 g/dL (ref 30.0–36.0)
MCV: 80.9 fl (ref 78.0–100.0)
Monocytes Absolute: 0.6 10*3/uL (ref 0.1–1.0)
Neutrophils Relative %: 60.8 % (ref 43.0–77.0)
Platelets: 241 10*3/uL (ref 150.0–400.0)
WBC: 8.7 10*3/uL (ref 4.5–10.5)

## 2012-03-05 LAB — PULMONARY FUNCTION TEST

## 2012-03-05 LAB — IBC PANEL
Iron: 47 ug/dL (ref 42–145)
Transferrin: 245 mg/dL (ref 212.0–360.0)

## 2012-03-05 MED ORDER — FAMOTIDINE 20 MG PO TABS: ORAL_TABLET | ORAL | Status: DC

## 2012-03-05 NOTE — Patient Instructions (Addendum)
Add pepcid 20 mg one at bedtime.  GERD (REFLUX)  is an extremely common cause of respiratory symptoms, many times with no significant heartburn at all.    It can be treated with medication, but also with lifestyle changes including avoidance of late meals, excessive alcohol, smoking cessation, and avoid fatty foods, chocolate, peppermint, colas, red wine, and acidic juices such as orange juice.  NO MINT OR MENTHOL PRODUCTS SO NO COUGH DROPS  USE SUGARLESS CANDY INSTEAD (jolley ranchers or Stover's)  NO OIL BASED VITAMINS - use powdered substitutes.    Please remember to go to the lab  department downstairs for your tests - we will call you with the results when they are available and refer you back to Jaime [name] for evaluation of anemia.

## 2012-03-05 NOTE — Progress Notes (Signed)
PFT done today. 

## 2012-03-05 NOTE — Assessment & Plan Note (Signed)
-   10/24/2011  Walked RA x 3 laps @ 185 ft each stopped due to end of study, no desats     - PFT's 03/05/2012 2.11 (82%) ratio 81 and no better p saba,  DLCO 63 corrects to 120  The only abnormality on pft's was reduction in erv so strongly suspect obesity/ deconditioning plus effects of anemia so no further pulmonary eval needed at this point > referred back to Dr Debby Bud

## 2012-03-05 NOTE — Assessment & Plan Note (Signed)
Last colonoscopy Oct '04 - negative IBC panel 4/12//12: % iron saturation 8.6% (low) > repeat fesat 13.7 03/05/2012   She is borderline fe deficient and microcyctic, defer further w/u to Dr Debby Bud

## 2012-03-05 NOTE — Progress Notes (Signed)
Subjective:    Patient ID: Jaime [name], female    DOB: Oct 27, 1955   MRN: [id number]  HPI  24 yobf never regular smoker with asthma as child but grew in elementary school and did not require inhlaler  But new onset sob 05/2011 referred 09/12/2011 to pulmonary clinic by Dr Debby Bud  09/12/2011 1st pulmonary eval on ACEI  cc mild dry cough x one year comes and goes with sensation of something stuck in throat better with ppi then developed multiple  spells x 15 min typically brought on by activity, assoc with sensation of weakness and can't get her breath, not necessarily proportionate to activity but increasing in frequency and may occur more often immediately p standing. No overt sinus or reflux symptoms at present, not better with otcs or inhalers. rec Stop vasoretic for now and let us know if your blood pressure when you take it at home is over 140/90     09/26/2011 f/u ov/Jaime [name] cc cough x ? A year, worse in am with green sputum only in am  and gen anterior ha's. Light headedness resolved off acei, cough intensity better. Sob no better. Not using ppi ac as rec rec Avoid salt and continue potassium daily Furosemide 20 mg one each am Protonix 40 mg Take 30-60 min before first meal of the day and add Pepcid 20 mg one at bedtime Please see patient coordinator before you leave today  to schedule sinus ct > nl Please remember to go to the lab and x-ray department downstairs for your tests > did not go for esr   10/24/2011 f/u ov/Jaime [name] cc cough ? Brought on by talking mostly dry and sob x 20-30 ft "every time she tries to walk" no excess mucus production, cough is mostly daytime, not following instructions re pepcid at hs. rec Take delsym two tsp every 12 hours and supplement if needed with  tramadol 50 mg up to 2 every 4 hours to suppress the urge to cough and even clear your throat. Try Protonix 40 mg Take 30-60 min before first meal of the day and Pepcid 20 mg one bedtime perfectly regularly GERD  diet Prednisone 10 mg take  4 each am x 2 days,   2 each am x 2 days,  1 each am x2days and stop  Bystolic 5 mg one daily until you see Norins but stop lasix if light headed when standing. If not better in 2 weeks, call to schedule bronchoscopy  FOB done 5/23 with nl airways, neg TBBX  12/26/2011 f/u ov/Jaime [name] cc still some dry cough brought on by talking, no trouble sleeping, still feel a tickle in throat. Also c/o doe x exertion like steps, not variable. No overt sinus or hb symptoms. rec No change rx Please schedule a follow up visit in 2 months but call sooner if needed with CXR and PFT's  03/05/2012 f/u ov/Jaime [name] cc sob walking a couple blocks, No obvious daytime variabilty or assoc chronic cough or cp or chest tightness, subjective wheeze overt sinus or hb symptoms. No unusual exp hx   Sleeping ok without nocturnal  or early am exacerbation  of respiratory  c/o's or need for noct saba. Also denies any obvious fluctuation of symptoms with weather or environmental changes or other aggravating or alleviating factors except as outlined above     ROS  At present neg for  any significant sore throat, dysphagia, dental problems, itching, sneezing,  nasal congestion or excess/ purulent secretions, ear ache,   fever,  chills, sweats, unintended wt loss, pleuritic or exertional cp, hemoptysis, palpitations, orthopnea pnd or leg swelling.  Also denies presyncope, palpitations, heartburn, abdominal pain, anorexia, nausea, vomiting, diarrhea  or change in bowel or urinary habits, change in stools or urine, dysuria,hematuria,  rash, arthralgias, visual complaints, headache, numbness weakness or ataxia or problems with walking or coordination. No noted change in mood/affect or memory.                             Objective:   Physical Exam  09/12/2011  Wt 197 > 09/26/2011 199>  03/05/2012  201  amb bf no longer hoarse,  No longer with pseudo wheeze   HEENT: nl dentition, turbinates, and orophanx.  Nl external ear canals without cough reflex   NECK :  without JVD/Nodes/TM/ nl carotid upstrokes bilaterally   LUNGS: no acc muscle use, clear to A and P bilaterally without cough on insp or exp maneuvers   CV:  RRR  no s3 or murmur or increase in P2, no edema   ABD:  soft and nontender with nl excursion in the supine position. No bruits or organomegaly, bowel sounds nl  MS:  warm without deformities, calf tenderness, cyanosis or clubbing  SKIN  No rash     CXR  03/05/2012 :  No active disease. Mild hyperinflation.         Assessment & Plan:

## 2012-03-05 NOTE — Assessment & Plan Note (Signed)
Onset Jan '12     trial off  ACEI 09/12/11 by Wert/ Pulmonary clinic Sinus ct 09/28/2011 > Normal paranasal sinuses.  Resolved 03/05/2012 off ace and on gerd rx, no further w/u needed

## 2012-03-06 ENCOUNTER — Telehealth: Payer: Self-pay | Admitting: Internal Medicine

## 2012-03-06 NOTE — Telephone Encounter (Signed)
Result Note     Call patient : Studies suggest no overall change, defer issue of borderline microcytic anemia to Dr Debby Bud    [id number] x1

## 2012-03-06 NOTE — Telephone Encounter (Signed)
Pt is aware. Jennifer Castillo, CMA  

## 2012-03-08 ENCOUNTER — Encounter: Payer: Self-pay | Admitting: Internal Medicine

## 2012-03-20 ENCOUNTER — Ambulatory Visit (INDEPENDENT_AMBULATORY_CARE_PROVIDER_SITE_OTHER): Payer: 59

## 2012-03-20 VITALS — BP 108/64

## 2012-03-20 DIAGNOSIS — I10 Essential (primary) hypertension: Secondary | ICD-10-CM

## 2012-04-23 ENCOUNTER — Other Ambulatory Visit: Payer: Self-pay | Admitting: Internal Medicine

## 2012-04-30 ENCOUNTER — Other Ambulatory Visit: Payer: Self-pay | Admitting: Internal Medicine

## 2012-10-24 ENCOUNTER — Other Ambulatory Visit: Payer: Self-pay | Admitting: Internal Medicine

## 2012-11-29 DIAGNOSIS — D329 Benign neoplasm of meninges, unspecified: Secondary | ICD-10-CM | POA: Insufficient documentation

## 2012-12-31 ENCOUNTER — Other Ambulatory Visit: Payer: Self-pay | Admitting: Internal Medicine

## 2013-01-17 ENCOUNTER — Encounter: Payer: Self-pay | Admitting: Gastroenterology

## 2013-01-25 ENCOUNTER — Encounter: Payer: Self-pay | Admitting: Gastroenterology

## 2013-02-28 ENCOUNTER — Other Ambulatory Visit: Payer: Self-pay | Admitting: Internal Medicine

## 2013-03-19 ENCOUNTER — Encounter: Payer: Self-pay | Admitting: Gastroenterology

## 2013-03-19 ENCOUNTER — Ambulatory Visit (AMBULATORY_SURGERY_CENTER): Payer: Self-pay

## 2013-03-19 VITALS — Ht 66.0 in | Wt 189.6 lb

## 2013-03-19 DIAGNOSIS — Z800 Family history of malignant neoplasm of digestive organs: Secondary | ICD-10-CM

## 2013-03-19 MED ORDER — NA SULFATE-K SULFATE-MG SULF 17.5-3.13-1.6 GM/177ML PO SOLN: 1.0000 | Freq: Once | ORAL | Status: DC

## 2013-04-02 ENCOUNTER — Ambulatory Visit (AMBULATORY_SURGERY_CENTER): Payer: 59 | Admitting: Gastroenterology

## 2013-04-02 ENCOUNTER — Encounter: Payer: Self-pay | Admitting: Gastroenterology

## 2013-04-02 VITALS — BP 150/79 | HR 67 | Temp 97.1°F | Resp 41 | Ht 66.0 in | Wt 189.0 lb

## 2013-04-02 DIAGNOSIS — Z800 Family history of malignant neoplasm of digestive organs: Secondary | ICD-10-CM

## 2013-04-02 DIAGNOSIS — K5730 Diverticulosis of large intestine without perforation or abscess without bleeding: Secondary | ICD-10-CM

## 2013-04-02 DIAGNOSIS — Z1211 Encounter for screening for malignant neoplasm of colon: Secondary | ICD-10-CM

## 2013-04-02 MED ORDER — SODIUM CHLORIDE 0.9 % IV SOLN: 500.0000 mL | INTRAVENOUS | Status: DC

## 2013-04-02 NOTE — Progress Notes (Signed)
Patient did not experience any of the following events: a burn prior to discharge; a fall within the facility; wrong site/side/patient/procedure/implant event; or a hospital transfer or hospital admission upon discharge from the facility. (G8907) Patient did not have preoperative order for IV antibiotic SSI prophylaxis. (G8918)  

## 2013-04-02 NOTE — Op Note (Signed)
[facility] [address] [place], [address]   COLONOSCOPY PROCEDURE REPORT  PATIENT: [name], [name]  MR#: [id number] BIRTHDATE: 1955-12-19 , 57  yrs. old GENDER: Female ENDOSCOPIST: Louis Meckel, MD REFERRED BY: PROCEDURE DATE:  04/02/2013 PROCEDURE:   Colonoscopy, diagnostic First Screening Colonoscopy - Avg.  risk and is 50 yrs.  old or older - No.  Prior Negative Screening - Now for repeat screening. 10 or more years since last screening  History of Adenoma - Now for follow-up colonoscopy & has been > or = to 3 yrs.  N/A  Polyps Removed Today? No.  Recommend repeat exam, <10 yrs? No. ASA CLASS:   Class II INDICATIONS:Average risk patient for colon cancer. MEDICATIONS: MAC sedation, administered by CRNA and propofol (Diprivan) 200mg  IV  DESCRIPTION OF PROCEDURE:   After the risks benefits and alternatives of the procedure were thoroughly explained, informed consent was obtained.  A digital rectal exam revealed no abnormalities of the rectum.   The LB [id number]  endoscope was introduced through the anus and advanced to the cecum, which was identified by both the appendix and ileocecal valve. No adverse events experienced.   The quality of the prep was Suprep good  The instrument was then slowly withdrawn as the colon was fully examined.      COLON FINDINGS: Moderate diverticulosis was noted in the sigmoid colon.   The colon was otherwise normal.  There was no diverticulosis, inflammation, polyps or cancers unless previously stated.  Retroflexed views revealed no abnormalities. The time to cecum=2 minutes 48 seconds.  Withdrawal time=8 minutes 01 seconds. The scope was withdrawn and the procedure completed. COMPLICATIONS: There were no complications.  ENDOSCOPIC IMPRESSION: 1.   Moderate diverticulosis was noted in the sigmoid colon 2.   The colon was otherwise normal  RECOMMENDATIONS: Continue current colorectal screening  recommendations for "routine risk" patients with a repeat colonoscopy in 10 years.   eSigned:  Louis Meckel, MD 04/02/2013 11:41 AM   cc: Jacques Navy, MD and Zelphia Cairo MD   PATIENT NAME:  Jaime [name], Jaime [name] MR#: [id number]

## 2013-04-02 NOTE — Patient Instructions (Signed)
Discharge instructions given with verbal understanding. Handouts on diverticulosis and a high fiber diet. Resume previous medications. YOU HAD AN ENDOSCOPIC PROCEDURE TODAY AT THE [facility]: Refer to the procedure report that was given to you for any specific questions about what was found during the examination.  If the procedure report does not answer your questions, please call your gastroenterologist to clarify.  If you requested that your care partner not be given the details of your procedure findings, then the procedure report has been included in a sealed envelope for you to review at your convenience later.  YOU SHOULD EXPECT: Some feelings of bloating in the abdomen. Passage of more gas than usual.  Walking can help get rid of the air that was put into your GI tract during the procedure and reduce the bloating. If you had a lower endoscopy (such as a colonoscopy or flexible sigmoidoscopy) you may notice spotting of blood in your stool or on the toilet paper. If you underwent a bowel prep for your procedure, then you may not have a normal bowel movement for a few days.  DIET: Your first meal following the procedure should be a light meal and then it is ok to progress to your normal diet.  A half-sandwich or bowl of soup is an example of a good first meal.  Heavy or fried foods are harder to digest and may make you feel nauseous or bloated.  Likewise meals heavy in dairy and vegetables can cause extra gas to form and this can also increase the bloating.  Drink plenty of fluids but you should avoid alcoholic beverages for 24 hours.  ACTIVITY: Your care partner should take you home directly after the procedure.  You should plan to take it easy, moving slowly for the rest of the day.  You can resume normal activity the day after the procedure however you should NOT DRIVE or use heavy machinery for 24 hours (because of the sedation medicines used during the test).    SYMPTOMS TO REPORT  IMMEDIATELY: A gastroenterologist can be reached at any hour.  During normal business hours, 8:30 AM to 5:00 PM Monday through Friday, call [phone].  After hours and on weekends, please call the GI answering service at [phone] who will take a message and have the physician on call contact you.   Following lower endoscopy (colonoscopy or flexible sigmoidoscopy):  Excessive amounts of blood in the stool  Significant tenderness or worsening of abdominal pains  Swelling of the abdomen that is new, acute  Fever of 100F or higher FOLLOW UP: If any biopsies were taken you will be contacted by phone or by letter within the next 1-3 weeks.  Call your gastroenterologist if you have not heard about the biopsies in 3 weeks.  Our staff will call the home number listed on your records the next business day following your procedure to check on you and address any questions or concerns that you may have at that time regarding the information given to you following your procedure. This is a courtesy call and so if there is no answer at the home number and we have not heard from you through the emergency physician on call, we will assume that you have returned to your regular daily activities without incident.  SIGNATURES/CONFIDENTIALITY: You and/or your care partner have signed paperwork which will be entered into your electronic medical record.  These signatures attest to the fact that that the information above on your After   Visit Summary has been reviewed and is understood.  Full responsibility of the confidentiality of this discharge information lies with you and/or your care-partner. 

## 2013-04-03 ENCOUNTER — Telehealth: Payer: Self-pay

## 2013-04-03 NOTE — Telephone Encounter (Signed)
Left message on answering machine. 

## 2013-04-08 ENCOUNTER — Other Ambulatory Visit (INDEPENDENT_AMBULATORY_CARE_PROVIDER_SITE_OTHER): Payer: 59

## 2013-04-08 ENCOUNTER — Ambulatory Visit (INDEPENDENT_AMBULATORY_CARE_PROVIDER_SITE_OTHER): Payer: 59 | Admitting: Internal Medicine

## 2013-04-08 ENCOUNTER — Encounter: Payer: Self-pay | Admitting: Internal Medicine

## 2013-04-08 VITALS — BP 138/90 | HR 80 | Temp 97.5°F | Wt 189.8 lb

## 2013-04-08 DIAGNOSIS — E042 Nontoxic multinodular goiter: Secondary | ICD-10-CM

## 2013-04-08 DIAGNOSIS — Z23 Encounter for immunization: Secondary | ICD-10-CM

## 2013-04-08 DIAGNOSIS — F329 Major depressive disorder, single episode, unspecified: Secondary | ICD-10-CM

## 2013-04-08 DIAGNOSIS — I10 Essential (primary) hypertension: Secondary | ICD-10-CM

## 2013-04-08 DIAGNOSIS — F32A Depression, unspecified: Secondary | ICD-10-CM | POA: Insufficient documentation

## 2013-04-08 DIAGNOSIS — D509 Iron deficiency anemia, unspecified: Secondary | ICD-10-CM

## 2013-04-08 DIAGNOSIS — K279 Peptic ulcer, site unspecified, unspecified as acute or chronic, without hemorrhage or perforation: Secondary | ICD-10-CM

## 2013-04-08 LAB — RETICULOCYTES
ABS Retic: 46.6 10*3/uL (ref 19.0–186.0)
RBC.: 3.88 MIL/uL (ref 3.87–5.11)

## 2013-04-08 LAB — BASIC METABOLIC PANEL
BUN: 15 mg/dL (ref 6–23)
CO2: 30 mEq/L (ref 19–32)
Calcium: 8.5 mg/dL (ref 8.4–10.5)
Chloride: 106 mEq/L (ref 96–112)
Creatinine, Ser: 0.7 mg/dL (ref 0.4–1.2)

## 2013-04-08 LAB — IBC PANEL
Iron: 45 ug/dL (ref 42–145)
Saturation Ratios: 14 % — ABNORMAL LOW (ref 20.0–50.0)
Transferrin: 230.3 mg/dL (ref 212.0–360.0)

## 2013-04-08 LAB — CBC
HCT: 31.6 % — ABNORMAL LOW (ref 36.0–46.0)
MCHC: 32.7 g/dL (ref 30.0–36.0)
MCV: 82.5 fl (ref 78.0–100.0)
Platelets: 232 10*3/uL (ref 150.0–400.0)
RBC: 3.83 Mil/uL — ABNORMAL LOW (ref 3.87–5.11)

## 2013-04-08 NOTE — Assessment & Plan Note (Signed)
Some conjunctival pallor on exam.   Plan:  Will recheck her CBC, retic count, and iron studies today.

## 2013-04-08 NOTE — Patient Instructions (Addendum)
For your depression: Start taking bupropion 150 mg every morning. This will help treat your depression. Try to get engaged in community activities, and try exercising daily. This may help your mood and make you feel more energetic.   For your insomnia: Your insomnia is most likely related to your depression. The best treatment for your insomnia is sleep hygiene. See the below information for tips on sleep hygiene. It will be hard to change your routine, but this will be the most effective treatment.   We will check some routine labs today: for you low blood counts, your low iron, your blood pressure, and your thyroid.   We will also give you a flu shot today. You may feel a very low fever or some muscle aches over the next few days, but this is not the flu.   Insomnia Insomnia is frequent trouble falling and/or staying asleep. Insomnia can be a long term problem or a short term problem. Both are common. Insomnia can be a short term problem when the wakefulness is related to a certain stress or worry. Long term insomnia is often related to ongoing stress during waking hours and/or poor sleeping habits. Overtime, sleep deprivation itself can make the problem worse. Every little thing feels more severe because you are overtired and your ability to cope is decreased. CAUSES   Stress, anxiety, and depression.  Poor sleeping habits.  Distractions such as TV in the bedroom.  Naps close to bedtime.  Engaging in emotionally charged conversations before bed.  Technical reading before sleep.  Alcohol and other sedatives. They may make the problem worse. They can hurt normal sleep patterns and normal dream activity.  Stimulants such as caffeine for several hours prior to bedtime.  Pain syndromes and shortness of breath can cause insomnia.  Exercise late at night.  Changing time zones may cause sleeping problems (jet lag). It is sometimes helpful to have someone observe your sleeping patterns.  They should look for periods of not breathing during the night (sleep apnea). They should also look to see how long those periods last. If you live alone or observers are uncertain, you can also be observed at a sleep clinic where your sleep patterns will be professionally monitored. Sleep apnea requires a checkup and treatment. Give your caregivers your medical history. Give your caregivers observations your family has made about your sleep.  SYMPTOMS   Not feeling rested in the morning.  Anxiety and restlessness at bedtime.  Difficulty falling and staying asleep. TREATMENT   Your caregiver may prescribe treatment for an underlying medical disorders. Your caregiver can give advice or help if you are using alcohol or other drugs for self-medication. Treatment of underlying problems will usually eliminate insomnia problems.  Medications can be prescribed for short time use. They are generally not recommended for lengthy use.  Over-the-counter sleep medicines are not recommended for lengthy use. They can be habit forming.  You can promote easier sleeping by making lifestyle changes such as:  Using relaxation techniques that help with breathing and reduce muscle tension.  Exercising earlier in the day.  Changing your diet and the time of your last meal. No night time snacks.  Establish a regular time to go to bed.  Counseling can help with stressful problems and worry.  Soothing music and white noise may be helpful if there are background noises you cannot remove.  Stop tedious detailed work at least one hour before bedtime. HOME CARE INSTRUCTIONS   Keep a diary. Inform  your caregiver about your progress. This includes any medication side effects. See your caregiver regularly. Take note of:  Times when you are asleep.  Times when you are awake during the night.  The quality of your sleep.  How you feel the next day. This information will help your caregiver care for you.  Get  out of bed if you are still awake after 15 minutes. Read or do some quiet activity. Keep the lights down. Wait until you feel sleepy and go back to bed.  Keep regular sleeping and waking hours. Avoid naps.  Exercise regularly.  Avoid distractions at bedtime. Distractions include watching television or engaging in any intense or detailed activity like attempting to balance the household checkbook.  Develop a bedtime ritual. Keep a familiar routine of bathing, brushing your teeth, climbing into bed at the same time each night, listening to soothing music. Routines increase the success of falling to sleep faster.  Use relaxation techniques. This can be using breathing and muscle tension release routines. It can also include visualizing peaceful scenes. You can also help control troubling or intruding thoughts by keeping your mind occupied with boring or repetitive thoughts like the old concept of counting sheep. You can make it more creative like imagining planting one beautiful flower after another in your backyard garden.  During your day, work to eliminate stress. When this is not possible use some of the previous suggestions to help reduce the anxiety that accompanies stressful situations. MAKE SURE YOU:   Understand these instructions.  Will watch your condition.  Will get help right away if you are not doing well or get worse. Document Released: 06/03/2000 Document Revised: 08/29/2011 Document Reviewed: 07/04/2007 [facility] Patient Information 2014 [place], [place].

## 2013-04-08 NOTE — Assessment & Plan Note (Signed)
Plan:  Will recheck a TSH today.

## 2013-04-08 NOTE — Progress Notes (Signed)
Subjective:     Patient ID: Jaime [name], female   DOB: 1955-11-21, 57 y.o.   MRN: [id number]  HPI Ms. Drinkard is a 57 year old woman with a history of carotid artery stenosis, HTN, peripheral vascular disease, PUD, limb cramps, chronic cough, dyspnea, hilar adenopathy, and iron deficiency anemia who presents for evaluation of fatigue and poor sleep.  At night, she tosses and turns, and is often up until 3-4 AM. She is tired throughout the day and often spends the day in bed napping on and off. She lives with her mother who is having health issues. 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 She is able to continue these activities, but other than she spends her time in bed. She takes very long daytime naps. She used to work 3rd shift. She has used [place] in the past, which helped. She lives at home with her mom. Ms. Craine has a daughter and a grandson, but they live elsewhere in [place].  She endorses poor energy, significant anhedonia, concerns about her mom's health, low appetite, but denies an SI. She does have some anxiety symptoms--with thoughts running through her head and not feeling in control of these thoughts. Denies any AVH.    Patient defers flu shot. She did have a history of getting sick while she got the flu shot 20 something years ago.   Review of [facility]: Continued GERD, well managed with Protonix, but needs a new prescription.      Objective:   Physical Exam Filed Vitals:   04/08/13 1455  BP: 138/90  Pulse: 80  Temp: 97.5 F (36.4 C)   General: Well appearing woman sitting in the exam room in NAD.  CV: RRR Pulm: CTAB HEENT: Some minimal conjunctival pallor.  Extremities: Normal capillary refill.  MSE: Affect is constricted. Mood is "tired." Thought content includes worries about her mother's health and significant anhendonia: "I just want to lie in bed all day, don't want to get out and do anything." Denies any SI or AVH. Behavior not  concerning for AVH.   Current Outpatient Prescriptions on File Prior to Visit  Medication Sig Dispense Refill  . aspirin 325 MG tablet Take 81 mg by mouth daily.       Marland Kitchen gabapentin (NEURONTIN) 100 MG capsule TAKE ONE CAPSULE BY MOUTH 3 TIMES A DAY  90 capsule  3  . Multiple Vitamins-Minerals (MULTIVITAMINS THER. W/MINERALS) TABS Take 1 tablet by mouth as needed.       . pantoprazole (PROTONIX) 40 MG tablet TAKE 1 TABLET BY MOUTH EVERY MORNING  30 tablet  5  . potassium chloride (K-DUR) 10 MEQ tablet Take 10 mEq by mouth as needed. For cramps       . atenolol (TENORMIN) 50 MG tablet Take 1 tablet (50 mg total) by mouth daily.  30 tablet  11  . furosemide (LASIX) 20 MG tablet Take 1 tablet (20 mg total) by mouth daily.  30 tablet  11  . [DISCONTINUED] enalapril (VASOTEC) 10 MG tablet For blood pressure   5 tablet  0  . [DISCONTINUED] ferrous gluconate (FERGON) 325 MG tablet Take 325 mg by mouth daily.        . [DISCONTINUED] hydrochlorothiazide (MICROZIDE) 12.5 MG capsule Take 1 capsule (12.5 mg total) by mouth daily.  30 capsule  11  . [DISCONTINUED] zolpidem (AMBIEN) 10 MG tablet Take 10 mg by mouth at bedtime as needed.  No current facility-administered medications on file prior to visit.       Assessment:     Ms. Fulwider is a 57 year old woman with a history of carotid artery stenosis, HTN, peripheral vascular disease, PUD, limb cramps, chronic cough, dyspnea, hilar adenopathy, and iron deficiency anemia who presents with insomnia, anhedonia, decreased energy and low mood.     Plan:     See plan by problem list.

## 2013-04-08 NOTE — Assessment & Plan Note (Addendum)
Patient presents with changes in sleep, low appetite, significant anhedonia, and low mood. She has some anxiety symptoms of worrying about her mother's health.   Plan:  Start bupropion 150 mg daily.  We also advised the patient regarding engaging in the community and exercise.

## 2013-04-09 ENCOUNTER — Telehealth: Payer: Self-pay | Admitting: *Deleted

## 2013-04-09 NOTE — Telephone Encounter (Signed)
Pt called states the Buproprion that was to be filled at today's visit was not received by the pharmacy.  Please advise

## 2013-04-10 MED ORDER — PANTOPRAZOLE SODIUM 40 MG PO TBEC: DELAYED_RELEASE_TABLET | ORAL | Status: DC

## 2013-04-10 MED ORDER — BUPROPION HCL ER (XL) 150 MG PO TB24: 150.0000 mg | ORAL_TABLET | Freq: Every day | ORAL | Status: DC

## 2013-04-10 NOTE — Telephone Encounter (Signed)
Spoke with pt, advised Rx sent. 

## 2013-04-10 NOTE — Telephone Encounter (Signed)
Larey Seat a little behind yesterday - Rx just sent to pharmacy

## 2013-07-22 ENCOUNTER — Other Ambulatory Visit: Payer: Self-pay

## 2013-07-22 MED ORDER — ATENOLOL 50 MG PO TABS: 50.0000 mg | ORAL_TABLET | Freq: Every day | ORAL | Status: DC

## 2013-10-29 ENCOUNTER — Emergency Department (HOSPITAL_COMMUNITY)
Admission: EM | Admit: 2013-10-29 | Discharge: 2013-10-29 | Disposition: A | Discharge status: Home / Self Care | Payer: 59 | Attending: Emergency Medicine | Admitting: Emergency Medicine

## 2013-10-29 ENCOUNTER — Encounter (HOSPITAL_COMMUNITY): Payer: Self-pay | Admitting: Emergency Medicine

## 2013-10-29 DIAGNOSIS — Z862 Personal history of diseases of the blood and blood-forming organs and certain disorders involving the immune mechanism: Secondary | ICD-10-CM | POA: Insufficient documentation

## 2013-10-29 DIAGNOSIS — Z86011 Personal history of benign neoplasm of the brain: Secondary | ICD-10-CM | POA: Insufficient documentation

## 2013-10-29 DIAGNOSIS — M545 Low back pain, unspecified: Secondary | ICD-10-CM

## 2013-10-29 DIAGNOSIS — Z8639 Personal history of other endocrine, nutritional and metabolic disease: Secondary | ICD-10-CM | POA: Insufficient documentation

## 2013-10-29 DIAGNOSIS — G43909 Migraine, unspecified, not intractable, without status migrainosus: Secondary | ICD-10-CM | POA: Insufficient documentation

## 2013-10-29 DIAGNOSIS — R209 Unspecified disturbances of skin sensation: Secondary | ICD-10-CM | POA: Insufficient documentation

## 2013-10-29 DIAGNOSIS — Z79899 Other long term (current) drug therapy: Secondary | ICD-10-CM | POA: Insufficient documentation

## 2013-10-29 DIAGNOSIS — Z8673 Personal history of transient ischemic attack (TIA), and cerebral infarction without residual deficits: Secondary | ICD-10-CM | POA: Insufficient documentation

## 2013-10-29 DIAGNOSIS — Z87891 Personal history of nicotine dependence: Secondary | ICD-10-CM | POA: Insufficient documentation

## 2013-10-29 DIAGNOSIS — Z8711 Personal history of peptic ulcer disease: Secondary | ICD-10-CM | POA: Insufficient documentation

## 2013-10-29 DIAGNOSIS — Z7982 Long term (current) use of aspirin: Secondary | ICD-10-CM | POA: Insufficient documentation

## 2013-10-29 DIAGNOSIS — Z8742 Personal history of other diseases of the female genital tract: Secondary | ICD-10-CM | POA: Insufficient documentation

## 2013-10-29 MED ORDER — CYCLOBENZAPRINE HCL 10 MG PO TABS: 10.0000 mg | ORAL_TABLET | Freq: Two times a day (BID) | ORAL | Status: DC | PRN

## 2013-10-29 MED ORDER — IBUPROFEN 800 MG PO TABS: 800.0000 mg | ORAL_TABLET | Freq: Three times a day (TID) | ORAL | Status: DC

## 2013-10-29 NOTE — ED Notes (Addendum)
Pt c/o lower back pain x 2 days, denies injury or heavy lifting. Pt c/o tingling in left leg earlier, c/o pain in back of legs.

## 2013-10-29 NOTE — ED Provider Notes (Signed)
CSN: [id number]     Arrival date & time 10/29/13  1436 History  This chart was scribed for non-physician practitioner Charlann Lange, PA-C, working with Lacretia Leigh, MD, by Neta Ehlers, ED Scribe. This patient was seen in room WTR6/WTR6 and the patient's care was started at 4:33 PM.   First MD Initiated Contact with Patient 10/29/13 1611     Chief Complaint  Patient presents with  . Back Pain    The history is provided by the patient. No language interpreter was used.   HPI Comments: Jaime [name] is a 58 y.o. female who presents to the Emergency Department complaining of lower back pain which began yesterday and increased this morning. She reports the pain radiates to her buttock, pelvis, and legs bilaterally, and she reports intermittent "tingling" to her left leg. She characterizes the pain to her buttock and pelvis as "sharp." The pain is increased with sitting for prolonged periods and is improved by standing. The pt denies recent falls or injuries. She also denies urinary or bowel incontinence as well as difficulty ambulating. The pt has a h/o similar symptoms several years ago which she reports was attributed to sciatica. The pt takes several daily medications including aspirin and gabapentin.   Past Medical History  Diagnosis Date  . PVD (peripheral vascular disease)   . Migraine   . Meningioma   . 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 Nontoxic multinodular goiter   . Peptic ulcer disease   . Endometriosis    Past Surgical History  Procedure Laterality Date  . Tubal ligation    . Tonsillectomy  1965  . Ovarian cyst removal      right  . Orif ankle fracture      right  . Trigger finger release  2012    right thumb  . Video bronchoscopy  11/10/2011    Procedure: VIDEO BRONCHOSCOPY WITH FLUORO;  Surgeon: Tanda Rockers, MD;  Location: [facility];  Service: Cardiopulmonary;  Laterality: Bilateral;  . Ct radiation therapy  guide      CT done yearly for menigioma with radiation if needed  . Cyst removed      left wrist   Family History  Problem Relation Age of Onset  . Hypertension Mother   . Diabetes Mother   . Esophageal cancer Father     was a smoker  . Breast cancer Neg Hx   . Coronary artery disease Neg Hx   . Colon cancer Maternal Grandmother    History  Substance Use Topics  . Smoking status: Former Smoker -- 0.30 packs/day for 3 years    Types: Cigarettes    Quit date: 06/20/1981  . Smokeless tobacco: Never Used  . Alcohol Use: No   No OB history provided.  Review of Systems  Constitutional: Negative for fever.  Genitourinary: Negative for urgency.  Musculoskeletal: Positive for back pain. Negative for gait problem.  Neurological: Positive for numbness.  All other systems reviewed and are negative.   Allergies  Review of patient's allergies indicates no known allergies.  Home Medications   Prior to Admission medications   Medication Sig Start Date End Date Taking? Authorizing Provider  aspirin 325 MG tablet Take 81 mg by mouth daily.     Historical Provider, MD  atenolol (TENORMIN) 50 MG tablet Take 1 tablet (50 mg total) by mouth daily. 07/22/13 08/25/14  Neena Rhymes, MD  buPROPion (WELLBUTRIN XL) 150 MG 24 hr tablet  Take 1 tablet (150 mg total) by mouth daily. 04/10/13   Neena Rhymes, MD  furosemide (LASIX) 20 MG tablet Take 1 tablet (20 mg total) by mouth daily. 09/26/11 09/25/12  Tanda Rockers, MD  gabapentin (NEURONTIN) 100 MG capsule TAKE ONE CAPSULE BY MOUTH 3 TIMES A DAY 01/13/12   Neena Rhymes, MD  Multiple Vitamins-Minerals (MULTIVITAMINS THER. W/MINERALS) TABS Take 1 tablet by mouth as needed.     Historical Provider, MD  pantoprazole (PROTONIX) 40 MG tablet TAKE 1 TABLET BY MOUTH EVERY MORNING 04/10/13   Neena Rhymes, MD  potassium chloride (K-DUR) 10 MEQ tablet Take 10 mEq by mouth as needed. For cramps     Historical Provider, MD   Triage Vitals: BP 146/79   Pulse 85  Temp(Src) 98.1 F (36.7 C) (Oral)  Resp 16  SpO2 99%  Physical Exam  Nursing note and vitals reviewed. Constitutional: She is oriented to person, place, and time. She appears well-developed and well-nourished. No distress.  HENT:  Head: Normocephalic and atraumatic.  Eyes: EOM are normal.  Neck: Neck supple. No tracheal deviation present.  Cardiovascular: Normal rate, regular rhythm and normal heart sounds.   No murmur heard. Pulmonary/Chest: Effort normal and breath sounds normal. No respiratory distress. She has no wheezes.  Musculoskeletal: Normal range of motion. She exhibits tenderness. She exhibits no edema.  Tender to midline lumbar spine and bilateral paraspinal areas.  Tenderness to palpation to sciatica bilaterally.  No tenderness above lumbar area or into the sacral area.   Neurological: She is alert and oriented to person, place, and time. She has normal reflexes. Coordination normal.  Ambulatory without difficulty or imbalance.   Skin: Skin is warm and dry. No rash noted. No erythema.  No erythema or swelling visualized.  No rashes visualized.   Psychiatric: She has a normal mood and affect. Her behavior is normal.    ED Course  Procedures (including critical care time)  DIAGNOSTIC STUDIES: Oxygen Saturation is 99% on room air, normal by my interpretation.    COORDINATION OF CARE:  4:38 PM- Discussed treatment plan with patient, and the patient agreed to the plan.   Labs Review Labs Reviewed - No data to display  Imaging Review No results found.   EKG Interpretation None      MDM   Final diagnoses:  None    1. Low back pain  No neurologic deficits. Exam supports musculoskeletal back pain.   I personally performed the services described in this documentation, which was scribed in my presence. The recorded information has been reviewed and is accurate.     Dewaine Oats, PA-C 10/29/13 1710

## 2013-10-29 NOTE — Discharge Instructions (Signed)
Back Pain, Adult Low back pain is very common. About 1 in 5 people have back pain.The cause of low back pain is rarely dangerous. The pain often gets better over time.About half of people with a sudden onset of back pain feel better in just 2 weeks. About 8 in 10 people feel better by 6 weeks.  CAUSES Some common causes of back pain include:  Strain of the muscles or ligaments supporting the spine.  Wear and tear (degeneration) of the spinal discs.  Arthritis.  Direct injury to the back. DIAGNOSIS Most of the time, the direct cause of low back pain is not known.However, back pain can be treated effectively even when the exact cause of the pain is unknown.Answering your caregiver's questions about your overall health and symptoms is one of the most accurate ways to make sure the cause of your pain is not dangerous. If your caregiver needs more information, he or she may order lab work or imaging tests (X-rays or MRIs).However, even if imaging tests show changes in your back, this usually does not require surgery. HOME CARE INSTRUCTIONS For many people, back pain returns.Since low back pain is rarely dangerous, it is often a condition that people can learn to manageon their own.   Remain active. It is stressful on the back to sit or stand in one place. Do not sit, drive, or stand in one place for more than 30 minutes at a time. Take short walks on level surfaces as soon as pain allows.Try to increase the length of time you walk each day.  Do not stay in bed.Resting more than 1 or 2 days can delay your recovery.  Do not avoid exercise or work.Your body is made to move.It is not dangerous to be active, even though your back may hurt.Your back will likely heal faster if you return to being active before your pain is gone.  Pay attention to your body when you bend and lift. Many people have less discomfortwhen lifting if they bend their knees, keep the load close to their bodies,and  avoid twisting. Often, the most comfortable positions are those that put less stress on your recovering back.  Find a comfortable position to sleep. Use a firm mattress and lie on your side with your knees slightly bent. If you lie on your back, put a pillow under your knees.  Only take over-the-counter or prescription medicines as directed by your caregiver. Over-the-counter medicines to reduce pain and inflammation are often the most helpful.Your caregiver may prescribe muscle relaxant drugs.These medicines help dull your pain so you can more quickly return to your normal activities and healthy exercise.  Put ice on the injured area.  Put ice in a plastic bag.  Place a towel between your skin and the bag.  Leave the ice on for 15-20 minutes, 03-04 times a day for the first 2 to 3 days. After that, ice and heat may be alternated to reduce pain and spasms.  Ask your caregiver about trying back exercises and gentle massage. This may be of some benefit.  Avoid feeling anxious or stressed.Stress increases muscle tension and can worsen back pain.It is important to recognize when you are anxious or stressed and learn ways to manage it.Exercise is a great option. SEEK MEDICAL CARE IF:  You have pain that is not relieved with rest or medicine.  You have pain that does not improve in 1 week.  You have new symptoms.  You are generally not feeling well. SEEK   IMMEDIATE MEDICAL CARE IF:   You have pain that radiates from your back into your legs.  You develop new bowel or bladder control problems.  You have unusual weakness or numbness in your arms or legs.  You develop nausea or vomiting.  You develop abdominal pain.  You feel faint. Document Released: 06/06/2005 Document Revised: 12/06/2011 Document Reviewed: 10/25/2010 [employer] Patient Information 2014 [employer].  

## 2013-10-30 NOTE — ED Provider Notes (Signed)
Medical screening examination/treatment/procedure(s) were performed by non-physician practitioner and as supervising physician I was immediately available for consultation/collaboration.   Leota Jacobsen, MD 10/30/13 [phone]

## 2014-01-28 DIAGNOSIS — D239 Other benign neoplasm of skin, unspecified: Secondary | ICD-10-CM | POA: Insufficient documentation

## 2014-03-14 ENCOUNTER — Ambulatory Visit (INDEPENDENT_AMBULATORY_CARE_PROVIDER_SITE_OTHER): Payer: 59 | Admitting: Internal Medicine

## 2014-03-14 ENCOUNTER — Other Ambulatory Visit (INDEPENDENT_AMBULATORY_CARE_PROVIDER_SITE_OTHER): Payer: 59

## 2014-03-14 ENCOUNTER — Encounter: Payer: Self-pay | Admitting: Internal Medicine

## 2014-03-14 VITALS — BP 142/92 | HR 77 | Temp 98.0°F | Resp 14 | Ht 66.5 in | Wt 182.4 lb

## 2014-03-14 DIAGNOSIS — I10 Essential (primary) hypertension: Secondary | ICD-10-CM

## 2014-03-14 DIAGNOSIS — Z23 Encounter for immunization: Secondary | ICD-10-CM

## 2014-03-14 DIAGNOSIS — F32A Depression, unspecified: Secondary | ICD-10-CM

## 2014-03-14 DIAGNOSIS — K279 Peptic ulcer, site unspecified, unspecified as acute or chronic, without hemorrhage or perforation: Secondary | ICD-10-CM

## 2014-03-14 DIAGNOSIS — R7309 Other abnormal glucose: Secondary | ICD-10-CM

## 2014-03-14 DIAGNOSIS — R739 Hyperglycemia, unspecified: Secondary | ICD-10-CM

## 2014-03-14 DIAGNOSIS — D509 Iron deficiency anemia, unspecified: Secondary | ICD-10-CM

## 2014-03-14 DIAGNOSIS — F329 Major depressive disorder, single episode, unspecified: Secondary | ICD-10-CM

## 2014-03-14 DIAGNOSIS — F3289 Other specified depressive episodes: Secondary | ICD-10-CM

## 2014-03-14 DIAGNOSIS — D320 Benign neoplasm of cerebral meninges: Secondary | ICD-10-CM

## 2014-03-14 LAB — BASIC METABOLIC PANEL
BUN: 11 mg/dL (ref 6–23)
CO2: 27 mEq/L (ref 19–32)
Calcium: 8.8 mg/dL (ref 8.4–10.5)
Chloride: 104 mEq/L (ref 96–112)
Creatinine, Ser: 0.8 mg/dL (ref 0.4–1.2)
GFR: 90.95 mL/min (ref >60.00)
Glucose, Bld: 113 mg/dL — ABNORMAL HIGH (ref 70–99)
POTASSIUM: 3.5 meq/L (ref 3.5–5.1)
Sodium: 136 mEq/L (ref 135–145)

## 2014-03-14 LAB — HEMOGLOBIN A1C: HEMOGLOBIN A1C: 6.7 % — AB (ref 4.6–6.5)

## 2014-03-14 MED ORDER — ATENOLOL 100 MG PO TABS: 100.0000 mg | ORAL_TABLET | Freq: Every day | ORAL | Status: DC

## 2014-03-14 MED ORDER — ATENOLOL 50 MG PO TABS: 50.0000 mg | ORAL_TABLET | Freq: Every day | ORAL | Status: DC

## 2014-03-14 MED ORDER — PANTOPRAZOLE SODIUM 40 MG PO TBEC: DELAYED_RELEASE_TABLET | ORAL | Status: DC

## 2014-03-14 MED ORDER — SUVOREXANT 10 MG PO TABS: 10.0000 mg | ORAL_TABLET | Freq: Every day | ORAL | Status: DC

## 2014-03-14 MED ORDER — BUPROPION HCL ER (XL) 150 MG PO TB24: 150.0000 mg | ORAL_TABLET | Freq: Every day | ORAL | Status: DC

## 2014-03-14 NOTE — Assessment & Plan Note (Signed)
BP slightly elevated today and will increase her atenolol (HR 77 today). Check BMP today. Filed Vitals:   03/14/14 1321  BP: 142/92  Pulse:   Temp:   Resp:

## 2014-03-14 NOTE — Assessment & Plan Note (Signed)
She takes protonix daily and has symptoms if she forgets. Continue for now.

## 2014-03-14 NOTE — Assessment & Plan Note (Signed)
Stable and she continues to follow in [place]. No visual changes or blurring of her vision.

## 2014-03-14 NOTE — Progress Notes (Signed)
Pre visit review using our clinic review tool, if applicable. No additional management support is needed unless otherwise documented below in the visit note. 

## 2014-03-14 NOTE — Assessment & Plan Note (Signed)
Feels like she is getting benefit from wellbutrin and continue.

## 2014-03-14 NOTE — Patient Instructions (Addendum)
We will try you on a sleeping medicine called belsomra. This is a safer medicine than ambien and if you do well with it we can continue it.   We have given you the flu shot.   We will have you stop taking furosemide and potassium pills.   We will increase the dose of the atenolol and check your blood work today.  We want you to come back to check your blood pressure in about 4 weeks.   Come back to see the doctor in about 1 year if your blood pressure is doing well. If you blood pressure stays high we may need to see you back sooner.

## 2014-03-14 NOTE — Assessment & Plan Note (Signed)
Stable CBC at last, will recheck at next visit. No blood in stools or visible blood loss at this time.

## 2014-03-14 NOTE — Progress Notes (Signed)
   Subjective:    Patient ID: Jaime [name], female    DOB: Feb 22, 1956, 58 y.o.   MRN: [id number]  HPI The patient is a 58 YO female who is coming in today to establish care. She has PMH of meningioma (s/p radiation, no resection, stable), HTN, depression, GERD. She is having some difficulty falling asleep lately with her mother's health being poor right now and some other stressors in her life. She denies SOB, chest pain, abdominal pain. She is about to run out of protonix and she needs a refill. She denies any blurriness or change in her balance. She denies problems with her hearing.   Review of Systems  Constitutional: Negative for fever, activity change, appetite change and fatigue.  HENT: Negative.   Eyes: Negative.   Respiratory: Negative for cough, chest tightness, shortness of breath and wheezing.   Cardiovascular: Negative for chest pain, palpitations and leg swelling.  Gastrointestinal: Negative for abdominal pain, diarrhea, constipation and abdominal distention.  Endocrine: Negative.   Musculoskeletal: Negative.   Skin: Negative.   Neurological: Negative.   Psychiatric/Behavioral: Negative.       Objective:   Physical Exam  Constitutional: She is oriented to person, place, and time. She appears well-developed and well-nourished.  HENT:  Head: Normocephalic and atraumatic.  Eyes: EOM are normal.  Neck: Normal range of motion.  Cardiovascular: Normal rate and regular rhythm.   Pulmonary/Chest: Effort normal and breath sounds normal. No respiratory distress. She has no wheezes. She has no rales.  Abdominal: Soft. Bowel sounds are normal. She exhibits no distension. There is no tenderness. There is no rebound.  Neurological: She is alert and oriented to person, place, and time. Coordination normal.  Skin: Skin is warm and dry.   Filed Vitals:   03/14/14 1253 03/14/14 1321  BP: 162/102 142/92  Pulse: 77   Temp: 98 F (36.7 C)   TempSrc: Oral   Resp: 14   Height: 5'  6.5" (1.689 m)   Weight: 182 lb 6.4 oz (82.736 kg)   SpO2: 97%       Assessment & Plan:

## 2014-03-17 ENCOUNTER — Telehealth: Payer: Self-pay | Admitting: Internal Medicine

## 2014-03-17 NOTE — Telephone Encounter (Signed)
Patient wants to know if we have anything comparable that might be cheaper. She cannot afford this one.

## 2014-03-17 NOTE — Telephone Encounter (Signed)
Pt called requesting a discount card, if available, for Belsamra.  It was prescribed by Dr. Doug Sou last Friday.  Please call pt on [phone].

## 2014-03-19 MED ORDER — ZOLPIDEM TARTRATE 5 MG PO TABS: 5.0000 mg | ORAL_TABLET | Freq: Every evening | ORAL | Status: DC | PRN

## 2014-03-19 NOTE — Telephone Encounter (Signed)
Left msg on triage stating saw md on Friday and she rx belsomara. Called twice haven't received call back. Medication is too expensive. Requesting md to rx something else. Pls advise...Johny Chess

## 2014-03-19 NOTE — Telephone Encounter (Signed)
Notified pt with md recommendations. [facility] spoke with Mandy/pharmacist gave md authorization...Johny Chess

## 2014-03-19 NOTE — Telephone Encounter (Signed)
Can you call in [place] #30 refills 1 that she can try but let her know that this is not a good long term medicine.

## 2014-04-11 ENCOUNTER — Telehealth: Payer: Self-pay | Admitting: *Deleted

## 2014-04-11 ENCOUNTER — Ambulatory Visit: Payer: 59 | Admitting: *Deleted

## 2014-04-11 VITALS — BP 150/90

## 2014-04-11 DIAGNOSIS — I10 Essential (primary) hypertension: Secondary | ICD-10-CM

## 2014-04-11 NOTE — Telephone Encounter (Signed)
Pt BP on re-check was 150/90 and 148/90 Pt did take am BP meds

## 2014-04-11 NOTE — Telephone Encounter (Signed)
Do you know what her HR was? We recently increased her atenolol. BP okay.

## 2014-10-28 ENCOUNTER — Ambulatory Visit (INDEPENDENT_AMBULATORY_CARE_PROVIDER_SITE_OTHER): Payer: 59 | Admitting: Internal Medicine

## 2014-10-28 ENCOUNTER — Ambulatory Visit (INDEPENDENT_AMBULATORY_CARE_PROVIDER_SITE_OTHER)
Admission: RE | Admit: 2014-10-28 | Discharge: 2014-10-28 | Disposition: A | Discharge status: Home / Self Care | Payer: 59 | Source: Ambulatory Visit | Attending: Internal Medicine | Admitting: Internal Medicine

## 2014-10-28 ENCOUNTER — Encounter: Payer: Self-pay | Admitting: Internal Medicine

## 2014-10-28 VITALS — BP 152/102 | HR 62 | Temp 98.2°F | Resp 14 | Wt 187.8 lb

## 2014-10-28 DIAGNOSIS — R059 Cough, unspecified: Secondary | ICD-10-CM

## 2014-10-28 DIAGNOSIS — J209 Acute bronchitis, unspecified: Secondary | ICD-10-CM

## 2014-10-28 DIAGNOSIS — R05 Cough: Secondary | ICD-10-CM

## 2014-10-28 MED ORDER — AZITHROMYCIN 250 MG PO TABS: ORAL_TABLET | ORAL | Status: DC

## 2014-10-28 MED ORDER — PROAIR RESPICLICK 108 (90 BASE) MCG/ACT IN AEPB: 2.0000 | INHALATION_SPRAY | Freq: Four times a day (QID) | RESPIRATORY_TRACT | Status: DC | PRN

## 2014-10-28 NOTE — Progress Notes (Signed)
   Subjective:    Patient ID: Jaime [name], female    DOB: 1955/07/05, 59 y.o.   MRN: [id number]  HPI The patient is a 59 YO female who is coming in for cough and wheezing going on for about 6 weeks. A family member was sick around the start but she has just not gotten rid of it. Coughing spells at night time which keep her from sleeping. She does get SOB with exertion especially with hills. Mild wheeze at times. Denies sinus congestion but does have drainage in her nose. No ear pain or tenderness. No fevers or chills.   Review of Systems  Constitutional: Negative for fever, chills, activity change, appetite change, fatigue and unexpected weight change.  HENT: Positive for congestion. Negative for ear discharge, ear pain, postnasal drip, rhinorrhea, sinus pressure, sore throat and trouble swallowing.   Eyes: Negative.   Respiratory: Positive for cough, shortness of breath and wheezing. Negative for chest tightness.   Cardiovascular: Negative for chest pain, palpitations and leg swelling.  Gastrointestinal: Negative for nausea, abdominal pain, diarrhea, constipation and abdominal distention.  Musculoskeletal: Negative.       Objective:   Physical Exam  Constitutional: She appears well-developed and well-nourished.  HENT:  Head: Normocephalic and atraumatic.  Right Ear: External ear normal.  Left Ear: External ear normal.  Oropharynx with mild clear drainage, nasal turbinates with swelling  Eyes: EOM are normal.  Neck: Normal range of motion. No JVD present. No thyromegaly present.  Cardiovascular: Normal rate and regular rhythm.   Pulmonary/Chest: Effort normal. No respiratory distress. She has wheezes. She has no rales. She exhibits no tenderness.  Mild diffuse wheezing which does not clear with cough.  Lymphadenopathy:    She has no cervical adenopathy.   Filed Vitals:   10/28/14 0904  BP: 152/102  Pulse: 62  Temp: 98.2 F (36.8 C)  TempSrc: Oral  Resp: 14  Weight: 187 lb  12.8 oz (85.186 kg)  SpO2: 96%      Assessment & Plan:

## 2014-10-28 NOTE — Patient Instructions (Signed)
We have sent in the antibiotic called azithromycin (z-pack). Take 2 pills today and then 1 pill a day starting tomorrow until it is gone.   We have given you the inhaler which should be free. You can use 2 puffs as needed for shortness of breath or wheezing every 6 hours.   We will also check a chest x-ray to make sure we are not missing a pneumonia.

## 2014-10-28 NOTE — Progress Notes (Signed)
Pre visit review using our clinic review tool, if applicable. No additional management support is needed unless otherwise documented below in the visit note. 

## 2014-10-29 DIAGNOSIS — J209 Acute bronchitis, unspecified: Secondary | ICD-10-CM | POA: Insufficient documentation

## 2014-10-29 NOTE — Assessment & Plan Note (Signed)
Checking CXR for possible pneumonia, albuterol inhaler for wheezing as she heals. Azithromycin for likely bacterial invasion.

## 2015-02-06 ENCOUNTER — Telehealth: Payer: Self-pay | Admitting: Emergency Medicine

## 2015-02-06 NOTE — Telephone Encounter (Signed)
Spoke with pt, she stated that she had been busy with her mother and would call soon to make an appt for a mammogram. Pt was instructed to have report sent to office once mammogram is completed.

## 2015-03-16 ENCOUNTER — Other Ambulatory Visit: Payer: Self-pay | Admitting: Internal Medicine

## 2015-03-26 ENCOUNTER — Ambulatory Visit (INDEPENDENT_AMBULATORY_CARE_PROVIDER_SITE_OTHER): Payer: 59 | Admitting: Internal Medicine

## 2015-03-26 ENCOUNTER — Other Ambulatory Visit (INDEPENDENT_AMBULATORY_CARE_PROVIDER_SITE_OTHER): Payer: 59

## 2015-03-26 ENCOUNTER — Encounter: Payer: Self-pay | Admitting: Internal Medicine

## 2015-03-26 VITALS — BP 190/138 | HR 66 | Temp 98.4°F | Resp 12 | Ht 66.5 in | Wt 188.0 lb

## 2015-03-26 DIAGNOSIS — I10 Essential (primary) hypertension: Secondary | ICD-10-CM | POA: Diagnosis not present

## 2015-03-26 DIAGNOSIS — Z0000 Encounter for general adult medical examination without abnormal findings: Secondary | ICD-10-CM

## 2015-03-26 DIAGNOSIS — Z23 Encounter for immunization: Secondary | ICD-10-CM | POA: Diagnosis not present

## 2015-03-26 LAB — COMPREHENSIVE METABOLIC PANEL
ALBUMIN: 3.8 g/dL (ref 3.5–5.2)
ALT: 11 U/L (ref 0–35)
AST: 16 U/L (ref 0–37)
Alkaline Phosphatase: 102 U/L (ref 39–117)
BUN: 9 mg/dL (ref 6–23)
CALCIUM: 8.9 mg/dL (ref 8.4–10.5)
CHLORIDE: 103 meq/L (ref 96–112)
CO2: 31 meq/L (ref 19–32)
Creatinine, Ser: 0.85 mg/dL (ref 0.40–1.20)
GFR: 88.17 mL/min (ref >60.00)
Glucose, Bld: 89 mg/dL (ref 70–99)
POTASSIUM: 4.2 meq/L (ref 3.5–5.1)
SODIUM: 139 meq/L (ref 135–145)
Total Bilirubin: 0.5 mg/dL (ref 0.2–1.2)
Total Protein: 7 g/dL (ref 6.0–8.3)

## 2015-03-26 LAB — HEMOGLOBIN A1C: HEMOGLOBIN A1C: 6.4 % (ref 4.6–6.5)

## 2015-03-26 LAB — LIPID PANEL
CHOLESTEROL: 176 mg/dL (ref 0–200)
HDL: 45.4 mg/dL (ref >39.00)
LDL Cholesterol: 115 mg/dL — ABNORMAL HIGH (ref 0–99)
NonHDL: 130.83
TRIGLYCERIDES: 81 mg/dL (ref 0.0–149.0)
Total CHOL/HDL Ratio: 4
VLDL: 16.2 mg/dL (ref 0.0–40.0)

## 2015-03-26 LAB — HEPATITIS C ANTIBODY: HCV Ab: NEGATIVE

## 2015-03-26 MED ORDER — HYDROCHLOROTHIAZIDE 25 MG PO TABS: 25.0000 mg | ORAL_TABLET | Freq: Every day | ORAL | Status: DC

## 2015-03-26 NOTE — Progress Notes (Signed)
Pre visit review using our clinic review tool, if applicable. No additional management support is needed unless otherwise documented below in the visit note. 

## 2015-03-26 NOTE — Patient Instructions (Signed)
We will add a medicine called hctz (hydrochlorothiazide) for the blood pressure. Take 1 pill daily to help with the pressure and we would like to see you back in about 1-2 months for a recheck to make sure you are doing okay with the medicine.   We are checking the labs today and will call you with the results.   Health Maintenance, Female Adopting a healthy lifestyle and getting preventive care can go a long way to promote health and wellness. Talk with your health care provider about what schedule of regular examinations is right for you. This is a good chance for you to check in with your provider about disease prevention and staying healthy. In between checkups, there are plenty of things you can do on your own. Experts have done a lot of research about which lifestyle changes and preventive measures are most likely to keep you healthy. Ask your health care provider for more information. WEIGHT AND DIET  Eat a healthy diet  Be sure to include plenty of vegetables, fruits, low-fat dairy products, and lean protein.  Do not eat a lot of foods high in solid fats, added sugars, or salt.  Get regular exercise. This is one of the most important things you can do for your health.  Most adults should exercise for at least 150 minutes each week. The exercise should increase your heart rate and make you sweat (moderate-intensity exercise).  Most adults should also do strengthening exercises at least twice a week. This is in addition to the moderate-intensity exercise.  Maintain a healthy weight  Body mass index (BMI) is a measurement that can be used to identify possible weight problems. It estimates body fat based on height and weight. Your health care provider can help determine your BMI and help you achieve or maintain a healthy weight.  For females 18 years of age and older:   A BMI below 18.5 is considered underweight.  A BMI of 18.5 to 24.9 is normal.  A BMI of 25 to 29.9 is considered  overweight.  A BMI of 30 and above is considered obese.  Watch levels of cholesterol and blood lipids  You should start having your blood tested for lipids and cholesterol at 58 years of age, then have this test every 5 years.  You may need to have your cholesterol levels checked more often if:  Your lipid or cholesterol levels are high.  You are older than 58 years of age.  You are at high risk for heart disease.  CANCER SCREENING   Lung Cancer  Lung cancer screening is recommended for adults 34-80 years old who are at high risk for lung cancer because of a history of smoking.  A yearly low-dose CT scan of the lungs is recommended for people who:  Currently smoke.  Have quit within the past 15 years.  Have at least a 30-pack-year history of smoking. A pack year is smoking an average of one pack of cigarettes a day for 1 year.  Yearly screening should continue until it has been 15 years since you quit.  Yearly screening should stop if you develop a health problem that would prevent you from having lung cancer treatment.  Breast Cancer  Practice breast self-awareness. This means understanding how your breasts normally appear and feel.  It also means doing regular breast self-exams. Let your health care provider know about any changes, no matter how small.  If you are in your 20s or 30s, you should have  a clinical breast exam (CBE) by a health care provider every 1-3 years as part of a regular health exam.  If you are 70 or older, have a CBE every year. Also consider having a breast X-ray (mammogram) every year.  If you have a family history of breast cancer, talk to your health care provider about genetic screening.  If you are at high risk for breast cancer, talk to your health care provider about having an MRI and a mammogram every year.  Breast cancer gene (BRCA) assessment is recommended for women who have family members with BRCA-related cancers. BRCA-related  cancers include:  Breast.  Ovarian.  Tubal.  Peritoneal cancers.  Results of the assessment will determine the need for genetic counseling and BRCA1 and BRCA2 testing. Cervical Cancer Your health care provider may recommend that you be screened regularly for cancer of the pelvic organs (ovaries, uterus, and vagina). This screening involves a pelvic examination, including checking for microscopic changes to the surface of your cervix (Pap test). You may be encouraged to have this screening done every 3 years, beginning at age 61.  For women ages 33-65, health care providers may recommend pelvic exams and Pap testing every 3 years, or they may recommend the Pap and pelvic exam, combined with testing for human papilloma virus (HPV), every 5 years. Some types of HPV increase your risk of cervical cancer. Testing for HPV may also be done on women of any age with unclear Pap test results.  Other health care providers may not recommend any screening for nonpregnant women who are considered low risk for pelvic cancer and who do not have symptoms. Ask your health care provider if a screening pelvic exam is right for you.  If you have had past treatment for cervical cancer or a condition that could lead to cancer, you need Pap tests and screening for cancer for at least 20 years after your treatment. If Pap tests have been discontinued, your risk factors (such as having a new sexual partner) need to be reassessed to determine if screening should resume. Some women have medical problems that increase the chance of getting cervical cancer. In these cases, your health care provider may recommend more frequent screening and Pap tests. Colorectal Cancer  This type of cancer can be detected and often prevented.  Routine colorectal cancer screening usually begins at 58 years of age and continues through 58 years of age.  Your health care provider may recommend screening at an earlier age if you have risk  factors for colon cancer.  Your health care provider may also recommend using home test kits to check for hidden blood in the stool.  A small camera at the end of a tube can be used to examine your colon directly (sigmoidoscopy or colonoscopy). This is done to check for the earliest forms of colorectal cancer.  Routine screening usually begins at age 36.  Direct examination of the colon should be repeated every 5-10 years through 58 years of age. However, you may need to be screened more often if early forms of precancerous polyps or small growths are found. Skin Cancer  Check your skin from head to toe regularly.  Tell your health care provider about any new moles or changes in moles, especially if there is a change in a mole's shape or color.  Also tell your health care provider if you have a mole that is larger than the size of a pencil eraser.  Always use sunscreen. Apply sunscreen  liberally and repeatedly throughout the day.  Protect yourself by wearing long sleeves, pants, a wide-brimmed hat, and sunglasses whenever you are outside. HEART DISEASE, DIABETES, AND HIGH BLOOD PRESSURE   High blood pressure causes heart disease and increases the risk of stroke. High blood pressure is more likely to develop in:  People who have blood pressure in the high end of the normal range (130-139/85-89 mm Hg).  People who are overweight or obese.  People who are African American.  If you are 46-69 years of age, have your blood pressure checked every 3-5 years. If you are 27 years of age or older, have your blood pressure checked every year. You should have your blood pressure measured twice--once when you are at a hospital or clinic, and once when you are not at a hospital or clinic. Record the average of the two measurements. To check your blood pressure when you are not at a hospital or clinic, you can use:  An automated blood pressure machine at a pharmacy.  A home blood pressure  monitor.  If you are between 83 years and 23 years old, ask your health care provider if you should take aspirin to prevent strokes.  Have regular diabetes screenings. This involves taking a blood sample to check your fasting blood sugar level.  If you are at a normal weight and have a low risk for diabetes, have this test once every three years after 58 years of age.  If you are overweight and have a high risk for diabetes, consider being tested at a younger age or more often. PREVENTING INFECTION  Hepatitis B  If you have a higher risk for hepatitis B, you should be screened for this virus. You are considered at high risk for hepatitis B if:  You were born in a country where hepatitis B is common. Ask your health care provider which countries are considered high risk.  Your parents were born in a high-risk country, and you have not been immunized against hepatitis B (hepatitis B vaccine).  You have HIV or AIDS.  You use needles to inject street drugs.  You live with someone who has hepatitis B.  You have had sex with someone who has hepatitis B.  You get hemodialysis treatment.  You take certain medicines for conditions, including cancer, organ transplantation, and autoimmune conditions. Hepatitis C  Blood testing is recommended for:  Everyone born from [birth year] through 1964.  Anyone with known risk factors for hepatitis C. Sexually transmitted infections (STIs)  You should be screened for sexually transmitted infections (STIs) including gonorrhea and chlamydia if:  You are sexually active and are younger than 58 years of age.  You are older than 58 years of age and your health care provider tells you that you are at risk for this type of infection.  Your sexual activity has changed since you were last screened and you are at an increased risk for chlamydia or gonorrhea. Ask your health care provider if you are at risk.  If you do not have HIV, but are at risk, it may be  recommended that you take a prescription medicine daily to prevent HIV infection. This is called pre-exposure prophylaxis (PrEP). You are considered at risk if:  You are sexually active and do not regularly use condoms or know the HIV status of your partner(s).  You take drugs by injection.  You are sexually active with a partner who has HIV. Talk with your health care provider about whether you  are at high risk of being infected with HIV. If you choose to begin PrEP, you should first be tested for HIV. You should then be tested every 3 months for as long as you are taking PrEP.  PREGNANCY   If you are premenopausal and you may become pregnant, ask your health care provider about preconception counseling.  If you may become pregnant, take 400 to 800 micrograms (mcg) of folic acid every day.  If you want to prevent pregnancy, talk to your health care provider about birth control (contraception). OSTEOPOROSIS AND MENOPAUSE   Osteoporosis is a disease in which the bones lose minerals and strength with aging. This can result in serious bone fractures. Your risk for osteoporosis can be identified using a bone density scan.  If you are 16 years of age or older, or if you are at risk for osteoporosis and fractures, ask your health care provider if you should be screened.  Ask your health care provider whether you should take a calcium or vitamin D supplement to lower your risk for osteoporosis.  Menopause may have certain physical symptoms and risks.  Hormone replacement therapy may reduce some of these symptoms and risks. Talk to your health care provider about whether hormone replacement therapy is right for you.  HOME CARE INSTRUCTIONS   Schedule regular health, dental, and eye exams.  Stay current with your immunizations.   Do not use any tobacco products including cigarettes, chewing tobacco, or electronic cigarettes.  If you are pregnant, do not drink alcohol.  If you are  breastfeeding, limit how much and how often you drink alcohol.  Limit alcohol intake to no more than 1 drink per day for nonpregnant women. One drink equals 12 ounces of beer, 5 ounces of wine, or 1 ounces of hard liquor.  Do not use street drugs.  Do not share needles.  Ask your health care provider for help if you need support or information about quitting drugs.  Tell your health care provider if you often feel depressed.  Tell your health care provider if you have ever been abused or do not feel safe at home.   This information is not intended to replace advice given to you by your health care provider. Make sure you discuss any questions you have with your health care provider.   Document Released: 12/20/2010 Document Revised: 06/27/2014 Document Reviewed: 05/08/2013 Elsevier Interactive Patient Education [employer].

## 2015-03-29 DIAGNOSIS — Z0001 Encounter for general adult medical examination with abnormal findings: Secondary | ICD-10-CM | POA: Insufficient documentation

## 2015-03-29 DIAGNOSIS — Z0000 Encounter for general adult medical examination without abnormal findings: Secondary | ICD-10-CM | POA: Insufficient documentation

## 2015-03-29 NOTE — Progress Notes (Signed)
   Subjective:    Patient ID: Jaime [name], female    DOB: 1955-07-24, 59 y.o.   MRN: [id number]  HPI The patient is coming in for wellness. She has been having some mild headaches lately. Still taking all her medicines. No chest pains or SOB, abdominal pain or nausea or vomiting.   PMH, [facility], social history reviewed and updated.   Review of Systems  Constitutional: Negative for fever, activity change, appetite change and fatigue.  Eyes: Negative.   Respiratory: Negative for cough, chest tightness, shortness of breath and wheezing.   Cardiovascular: Negative for chest pain, palpitations and leg swelling.  Gastrointestinal: Negative for abdominal pain, diarrhea, constipation and abdominal distention.  Musculoskeletal: Negative.   Skin: Negative.   Neurological: Positive for headaches. Negative for dizziness, weakness and numbness.  Psychiatric/Behavioral: Negative.       Objective:   Physical Exam  Constitutional: She is oriented to person, place, and time. She appears well-developed and well-nourished.  HENT:  Head: Normocephalic and atraumatic.  Eyes: EOM are normal.  Neck: Normal range of motion.  Cardiovascular: Normal rate and regular rhythm.   Pulmonary/Chest: Effort normal and breath sounds normal. No respiratory distress. She has no wheezes. She has no rales.  Abdominal: Soft. Bowel sounds are normal. She exhibits no distension. There is no tenderness. There is no rebound.  Neurological: She is alert and oriented to person, place, and time. Coordination normal.  Skin: Skin is warm and dry.  Psychiatric: She has a normal mood and affect.   Filed Vitals:   03/26/15 1301  BP: 190/138  Pulse: 66  Temp: 98.4 F (36.9 C)  TempSrc: Oral  Resp: 12  Height: 5' 6.5" (1.689 m)  Weight: 188 lb (85.276 kg)  SpO2: 98%      Assessment & Plan:  Tdap and flu shot given at visit.

## 2015-03-29 NOTE — Assessment & Plan Note (Signed)
Previously on atenolol alone but severely exacerbated today. Will add HCTZ 25 mg daily. Other than headache no symptoms. Checking CMP for damage and adjust as needed. See back quickly on new med for recheck BP and BMP.

## 2015-03-29 NOTE — Assessment & Plan Note (Signed)
Tdap and flu given at visit. Reminded her about mammogram. Colonoscopy due in 2024. Talked to her about blood pressure control and monitoring for her long term health.

## 2015-04-06 ENCOUNTER — Other Ambulatory Visit: Payer: Self-pay | Admitting: Internal Medicine

## 2015-04-13 ENCOUNTER — Telehealth: Payer: Self-pay | Admitting: Internal Medicine

## 2015-04-13 MED ORDER — ATENOLOL 100 MG PO TABS: 100.0000 mg | ORAL_TABLET | Freq: Every day | ORAL | Status: DC

## 2015-04-13 NOTE — Telephone Encounter (Signed)
RF request for atenolol LOV: 03/26/15 Next ov: 04/28/15 Last written: 03/14/14 #30 w/ 11RF  Pt advised and voiced understanding.

## 2015-04-13 NOTE — Telephone Encounter (Signed)
Patient requesting refill for atenolol (TENORMIN) 100 MG tablet [[id number]. She was told on her last visit that this would be sent in [facility] on [facility]

## 2015-04-28 ENCOUNTER — Other Ambulatory Visit (INDEPENDENT_AMBULATORY_CARE_PROVIDER_SITE_OTHER): Payer: 59

## 2015-04-28 ENCOUNTER — Encounter: Payer: Self-pay | Admitting: Internal Medicine

## 2015-04-28 ENCOUNTER — Ambulatory Visit (INDEPENDENT_AMBULATORY_CARE_PROVIDER_SITE_OTHER): Payer: 59 | Admitting: Internal Medicine

## 2015-04-28 VITALS — BP 122/76 | HR 65 | Temp 98.3°F | Resp 16 | Ht 66.5 in | Wt 189.0 lb

## 2015-04-28 DIAGNOSIS — D509 Iron deficiency anemia, unspecified: Secondary | ICD-10-CM | POA: Diagnosis not present

## 2015-04-28 DIAGNOSIS — I10 Essential (primary) hypertension: Secondary | ICD-10-CM

## 2015-04-28 LAB — CBC
HEMATOCRIT: 32.1 % — AB (ref 36.0–46.0)
Hemoglobin: 10.5 g/dL — ABNORMAL LOW (ref 12.0–15.0)
MCHC: 32.7 g/dL (ref 30.0–36.0)
MCV: 83.3 fl (ref 78.0–100.0)
Platelets: 219 10*3/uL (ref 150.0–400.0)
RBC: 3.86 Mil/uL — AB (ref 3.87–5.11)
RDW: 16.2 % — ABNORMAL HIGH (ref 11.5–15.5)
WBC: 7.6 10*3/uL (ref 4.0–10.5)

## 2015-04-28 LAB — BASIC METABOLIC PANEL
BUN: 10 mg/dL (ref 6–23)
CO2: 30 meq/L (ref 19–32)
Calcium: 8.9 mg/dL (ref 8.4–10.5)
Chloride: 106 mEq/L (ref 96–112)
Creatinine, Ser: 0.76 mg/dL (ref 0.40–1.20)
GFR: 100.29 mL/min (ref >60.00)
GLUCOSE: 95 mg/dL (ref 70–99)
POTASSIUM: 3.8 meq/L (ref 3.5–5.1)
SODIUM: 141 meq/L (ref 135–145)

## 2015-04-28 MED ORDER — ZOLPIDEM TARTRATE 10 MG PO TABS: 10.0000 mg | ORAL_TABLET | Freq: Every evening | ORAL | Status: DC | PRN

## 2015-04-28 NOTE — Patient Instructions (Signed)
We will check the blood today and call you back with the results.   Come back in about 3 months to check on the blood pressure.

## 2015-04-28 NOTE — Progress Notes (Signed)
   Subjective:    Patient ID: Jaime [name], female    DOB: Nov 10, 1955, 59 y.o.   MRN: [id number]  HPI The patient is a 59 YO female coming in for follow up of her blood pressure. She started new medicine hctz after last visit and has been taking it daily. Noticed more urination but no other side effects. Not having any headaches anymore. No chest pains or SOB. No abdominal pain. Feeling more cold lately and wants to know if her iron levels are okay. They have been low in the past.   Review of Systems  Constitutional: Negative for fever, activity change, appetite change and fatigue.  Eyes: Negative.   Respiratory: Negative for cough, chest tightness, shortness of breath and wheezing.   Cardiovascular: Negative for chest pain, palpitations and leg swelling.  Gastrointestinal: Negative for abdominal pain, diarrhea, constipation and abdominal distention.  Musculoskeletal: Negative.   Skin: Negative.   Neurological: Negative for dizziness, weakness, numbness and headaches.  Psychiatric/Behavioral: Negative.       Objective:   Physical Exam  Constitutional: She is oriented to person, place, and time. She appears well-developed and well-nourished.  HENT:  Head: Normocephalic and atraumatic.  Eyes: EOM are normal.  Neck: Normal range of motion.  Cardiovascular: Normal rate and regular rhythm.   Pulmonary/Chest: Effort normal and breath sounds normal. No respiratory distress. She has no wheezes. She has no rales.  Abdominal: Soft. Bowel sounds are normal. She exhibits no distension. There is no tenderness. There is no rebound.  Neurological: She is alert and oriented to person, place, and time. Coordination normal.  Skin: Skin is warm and dry.  Psychiatric: She has a normal mood and affect.   Filed Vitals:   04/28/15 1318  BP: 122/76  Pulse: 65  Temp: 98.3 F (36.8 C)  TempSrc: Oral  Resp: 16  Height: 5' 6.5" (1.689 m)  Weight: 189 lb (85.73 kg)  SpO2: 98%      Assessment &  Plan:

## 2015-04-28 NOTE — Progress Notes (Signed)
Pre visit review using our clinic review tool, if applicable. No additional management support is needed unless otherwise documented below in the visit note. 

## 2015-04-28 NOTE — Assessment & Plan Note (Signed)
Controlled today on atenolol and hctz. Checking BMP today for need for change. Headaches have resolved and were likely related to blood pressure.

## 2015-04-28 NOTE — Assessment & Plan Note (Signed)
Checking CBC today. No new signs of blood in stools or other loss.

## 2015-07-28 ENCOUNTER — Ambulatory Visit (INDEPENDENT_AMBULATORY_CARE_PROVIDER_SITE_OTHER): Payer: 59 | Admitting: Internal Medicine

## 2015-07-28 ENCOUNTER — Encounter: Payer: Self-pay | Admitting: Internal Medicine

## 2015-07-28 ENCOUNTER — Other Ambulatory Visit (INDEPENDENT_AMBULATORY_CARE_PROVIDER_SITE_OTHER): Payer: 59

## 2015-07-28 VITALS — BP 112/72 | HR 63 | Temp 97.3°F | Resp 14 | Ht 66.0 in | Wt 186.1 lb

## 2015-07-28 DIAGNOSIS — R7301 Impaired fasting glucose: Secondary | ICD-10-CM | POA: Diagnosis not present

## 2015-07-28 DIAGNOSIS — I10 Essential (primary) hypertension: Secondary | ICD-10-CM

## 2015-07-28 LAB — HEMOGLOBIN A1C: HEMOGLOBIN A1C: 6.7 % — AB (ref 4.6–6.5)

## 2015-07-28 MED ORDER — GABAPENTIN 100 MG PO CAPS: 100.0000 mg | ORAL_CAPSULE | Freq: Three times a day (TID) | ORAL | Status: DC

## 2015-07-28 NOTE — Patient Instructions (Signed)
We have sent in the refills today. We will check the labs today and get back with you about the results.

## 2015-07-28 NOTE — Progress Notes (Signed)
Pre visit review using our clinic review tool, if applicable. No additional management support is needed unless otherwise documented below in the visit note. 

## 2015-08-02 DIAGNOSIS — R7301 Impaired fasting glucose: Secondary | ICD-10-CM | POA: Insufficient documentation

## 2015-08-02 DIAGNOSIS — E118 Type 2 diabetes mellitus with unspecified complications: Secondary | ICD-10-CM | POA: Insufficient documentation

## 2015-08-02 NOTE — Assessment & Plan Note (Signed)
Checking HgA1c today as last was 6.4. She is not exercising right now and diet is poor during holidays.

## 2015-08-02 NOTE — Progress Notes (Signed)
   Subjective:    Patient ID: Jaime [name], female    DOB: February 16, 1956, 60 y.o.   MRN: [id number]  HPI The patient is a 60 YO female coming in for follow up of her impaired sugars. She had pre-diabetes in the past. She is not exercising more and has had some dietary change over the holiday season. She is also having some mild headaches in the last several months. She is more stressed and feels that this is the cause. Denies other new complaints.   Review of Systems  Constitutional: Negative for fever, activity change, appetite change and fatigue.  Eyes: Negative.   Respiratory: Negative for cough, chest tightness, shortness of breath and wheezing.   Cardiovascular: Negative for chest pain, palpitations and leg swelling.  Gastrointestinal: Negative for abdominal pain, diarrhea, constipation and abdominal distention.  Musculoskeletal: Negative.   Skin: Negative.   Neurological: Positive for headaches. Negative for dizziness, weakness and numbness.  Psychiatric/Behavioral: Negative.       Objective:   Physical Exam  Constitutional: She is oriented to person, place, and time. She appears well-developed and well-nourished.  HENT:  Head: Normocephalic and atraumatic.  Eyes: EOM are normal.  Neck: Normal range of motion.  Cardiovascular: Normal rate and regular rhythm.   Pulmonary/Chest: Effort normal and breath sounds normal. No respiratory distress. She has no wheezes. She has no rales.  Abdominal: Soft. Bowel sounds are normal. She exhibits no distension. There is no tenderness. There is no rebound.  Neurological: She is alert and oriented to person, place, and time. Coordination normal.  Skin: Skin is warm and dry.  Psychiatric: She has a normal mood and affect.   Filed Vitals:   07/28/15 1044  BP: 112/72  Pulse: 63  Temp: 97.3 F (36.3 C)  TempSrc: Oral  Resp: 14  Height: 5\' 6"  (1.676 m)  Weight: 186 lb 1.9 oz (84.423 kg)  SpO2: 96%      Assessment & Plan:

## 2015-08-02 NOTE — Assessment & Plan Note (Signed)
BP at goal on atenolol and hctz. Reviewed recent labs and no indication for change.

## 2015-09-23 ENCOUNTER — Other Ambulatory Visit: Payer: Self-pay

## 2015-09-23 DIAGNOSIS — Z1231 Encounter for screening mammogram for malignant neoplasm of breast: Secondary | ICD-10-CM

## 2015-10-13 ENCOUNTER — Ambulatory Visit
Admission: RE | Admit: 2015-10-13 | Discharge: 2015-10-13 | Disposition: A | Discharge status: Home / Self Care | Payer: 59 | Source: Ambulatory Visit

## 2015-10-13 DIAGNOSIS — Z1231 Encounter for screening mammogram for malignant neoplasm of breast: Secondary | ICD-10-CM

## 2015-11-18 DIAGNOSIS — M67431 Ganglion, right wrist: Secondary | ICD-10-CM | POA: Diagnosis not present

## 2015-11-18 DIAGNOSIS — M65341 Trigger finger, right ring finger: Secondary | ICD-10-CM | POA: Diagnosis not present

## 2015-12-03 ENCOUNTER — Other Ambulatory Visit: Payer: Self-pay | Admitting: Internal Medicine

## 2015-12-24 DIAGNOSIS — M65341 Trigger finger, right ring finger: Secondary | ICD-10-CM | POA: Diagnosis not present

## 2015-12-24 DIAGNOSIS — M67431 Ganglion, right wrist: Secondary | ICD-10-CM | POA: Diagnosis not present

## 2016-01-02 ENCOUNTER — Other Ambulatory Visit: Payer: Self-pay | Admitting: Internal Medicine

## 2016-01-21 DIAGNOSIS — D320 Benign neoplasm of cerebral meninges: Secondary | ICD-10-CM | POA: Diagnosis not present

## 2016-01-26 ENCOUNTER — Encounter: Payer: Self-pay | Admitting: Internal Medicine

## 2016-01-26 ENCOUNTER — Ambulatory Visit (INDEPENDENT_AMBULATORY_CARE_PROVIDER_SITE_OTHER): Payer: 59 | Admitting: Internal Medicine

## 2016-01-26 ENCOUNTER — Other Ambulatory Visit (INDEPENDENT_AMBULATORY_CARE_PROVIDER_SITE_OTHER): Payer: 59

## 2016-01-26 VITALS — BP 132/80 | HR 63 | Temp 98.4°F | Resp 16 | Ht 66.0 in | Wt 193.1 lb

## 2016-01-26 DIAGNOSIS — R7301 Impaired fasting glucose: Secondary | ICD-10-CM

## 2016-01-26 LAB — COMPREHENSIVE METABOLIC PANEL
ALT: 9 U/L (ref 0–35)
AST: 17 U/L (ref 0–37)
Albumin: 3.6 g/dL (ref 3.5–5.2)
Alkaline Phosphatase: 79 U/L (ref 39–117)
BILIRUBIN TOTAL: 0.5 mg/dL (ref 0.2–1.2)
BUN: 13 mg/dL (ref 6–23)
CALCIUM: 8.9 mg/dL (ref 8.4–10.5)
CO2: 29 meq/L (ref 19–32)
Chloride: 106 mEq/L (ref 96–112)
Creatinine, Ser: 0.75 mg/dL (ref 0.40–1.20)
GFR: 101.57 mL/min (ref >60.00)
Glucose, Bld: 94 mg/dL (ref 70–99)
Potassium: 3.8 mEq/L (ref 3.5–5.1)
Sodium: 141 mEq/L (ref 135–145)
Total Protein: 6.6 g/dL (ref 6.0–8.3)

## 2016-01-26 LAB — LIPID PANEL
CHOL/HDL RATIO: 4
Cholesterol: 160 mg/dL (ref 0–200)
HDL: 42.4 mg/dL (ref >39.00)
LDL Cholesterol: 104 mg/dL — ABNORMAL HIGH (ref 0–99)
NonHDL: 117.65
TRIGLYCERIDES: 69 mg/dL (ref 0.0–149.0)
VLDL: 13.8 mg/dL (ref 0.0–40.0)

## 2016-01-26 LAB — HEMOGLOBIN A1C: Hgb A1c MFr Bld: 6.7 % — ABNORMAL HIGH (ref 4.6–6.5)

## 2016-01-26 NOTE — Patient Instructions (Signed)
We will check the blood work today and call you back with the results.    Diabetes and Exercise Exercising regularly is important. It is not just about losing weight. It has many health benefits, such as:  Improving your overall fitness, flexibility, and endurance.  Increasing your bone density.  Helping with weight control.  Decreasing your body fat.  Increasing your muscle strength.  Reducing stress and tension.  Improving your overall health. People with diabetes who exercise gain additional benefits because exercise:  Reduces appetite.  Improves the body's use of blood sugar (glucose).  Helps lower or control blood glucose.  Decreases blood pressure.  Helps control blood lipids (such as cholesterol and triglycerides).  Improves the body's use of the hormone insulin by:  Increasing the body's insulin sensitivity.  Reducing the body's insulin needs.  Decreases the risk for heart disease because exercising:  Lowers cholesterol and triglycerides levels.  Increases the levels of good cholesterol (such as high-density lipoproteins [HDL]) in the body.  Lowers blood glucose levels. YOUR ACTIVITY PLAN  Choose an activity that you enjoy, and set realistic goals. To exercise safely, you should begin practicing any new physical activity slowly, and gradually increase the intensity of the exercise over time. Your health care provider or diabetes educator can help create an activity plan that works for you. General recommendations include:  Encouraging children to engage in at least 60 minutes of physical activity each day.  Stretching and performing strength training exercises, such as yoga or weight lifting, at least 2 times per week.  Performing a total of at least 150 minutes of moderate-intensity exercise each week, such as brisk walking or water aerobics.  Exercising at least 3 days per week, making sure you allow no more than 2 consecutive days to pass without  exercising.  Avoiding long periods of inactivity (90 minutes or more). When you have to spend an extended period of time sitting down, take frequent breaks to walk or stretch. RECOMMENDATIONS FOR EXERCISING WITH TYPE 1 OR TYPE 2 DIABETES   Check your blood glucose before exercising. If blood glucose levels are greater than 240 mg/dL, check for urine ketones. Do not exercise if ketones are present.  Avoid injecting insulin into areas of the body that are going to be exercised. For example, avoid injecting insulin into:  The arms when playing tennis.  The legs when jogging.  Keep a record of:  Food intake before and after you exercise.  Expected peak times of insulin action.  Blood glucose levels before and after you exercise.  The type and amount of exercise you have done.  Review your records with your health care provider. Your health care provider will help you to develop guidelines for adjusting food intake and insulin amounts before and after exercising.  If you take insulin or oral hypoglycemic agents, watch for signs and symptoms of hypoglycemia. They include:  Dizziness.  Shaking.  Sweating.  Chills.  Confusion.  Drink plenty of water while you exercise to prevent dehydration or heat stroke. Body water is lost during exercise and must be replaced.  Talk to your health care provider before starting an exercise program to make sure it is safe for you. Remember, almost any type of activity is better than none.   This information is not intended to replace advice given to you by your health care provider. Make sure you discuss any questions you have with your health care provider.   Document Released: 08/27/2003 Document Revised: 10/21/2014 Document  Reviewed: 11/13/2012 Elsevier Interactive Patient Education [employer].

## 2016-01-26 NOTE — Progress Notes (Signed)
Pre visit review using our clinic review tool, if applicable. No additional management support is needed unless otherwise documented below in the visit note. 

## 2016-01-26 NOTE — Progress Notes (Signed)
   Subjective:    Patient ID: Jaime [name], female    DOB: 05-Mar-1956, 60 y.o.   MRN: [id number]  HPI The patient is a 60 YO female coming in for follow up of her sugars. She is up about 3-4 pounds since last visit. She denies any new problems. Recent surgery on her hand to help a trigger finger. Doing slightly less exercise since that time. No chest pains or SOB or abdominal pain.   Review of Systems  Constitutional: Negative for activity change, appetite change, fatigue and fever.  Respiratory: Negative for cough, chest tightness, shortness of breath and wheezing.   Cardiovascular: Negative for chest pain, palpitations and leg swelling.  Gastrointestinal: Negative for abdominal distention, abdominal pain, constipation and diarrhea.  Musculoskeletal: Negative.   Skin: Negative.   Neurological: Positive for headaches. Negative for dizziness, weakness and numbness.      Objective:   Physical Exam  Constitutional: She is oriented to person, place, and time. She appears well-developed and well-nourished.  HENT:  Head: Normocephalic and atraumatic.  Eyes: EOM are normal.  Neck: Normal range of motion.  Cardiovascular: Normal rate and regular rhythm.   Pulmonary/Chest: Effort normal and breath sounds normal. No respiratory distress. She has no wheezes. She has no rales.  Abdominal: Soft. Bowel sounds are normal. She exhibits no distension. There is no tenderness. There is no rebound.  Neurological: She is alert and oriented to person, place, and time. Coordination normal.  Skin: Skin is warm and dry.   Vitals:   01/26/16 1302  BP: 132/80  Pulse: 63  Resp: 16  Temp: 98.4 F (36.9 C)  TempSrc: Oral  SpO2: 98%  Weight: 193 lb 1.9 oz (87.6 kg)  Height: 5\' 6"  (1.676 m)      Assessment & Plan:

## 2016-01-26 NOTE — Assessment & Plan Note (Signed)
Checking HgA1c and if still elevated she does have diabetes and talked to her about working on her weight to help with the sugars. She denies numbness or pain in her feet.

## 2016-03-28 ENCOUNTER — Other Ambulatory Visit: Payer: Self-pay | Admitting: Internal Medicine

## 2016-05-05 ENCOUNTER — Other Ambulatory Visit: Payer: Self-pay | Admitting: Internal Medicine

## 2016-06-30 ENCOUNTER — Other Ambulatory Visit (INDEPENDENT_AMBULATORY_CARE_PROVIDER_SITE_OTHER): Payer: 59

## 2016-06-30 ENCOUNTER — Encounter: Payer: Self-pay | Admitting: Internal Medicine

## 2016-06-30 ENCOUNTER — Ambulatory Visit (INDEPENDENT_AMBULATORY_CARE_PROVIDER_SITE_OTHER): Payer: 59 | Admitting: Internal Medicine

## 2016-06-30 VITALS — BP 140/78 | HR 67 | Temp 98.0°F | Resp 14 | Ht 66.0 in | Wt 186.0 lb

## 2016-06-30 DIAGNOSIS — R5383 Other fatigue: Secondary | ICD-10-CM

## 2016-06-30 DIAGNOSIS — R42 Dizziness and giddiness: Secondary | ICD-10-CM

## 2016-06-30 DIAGNOSIS — Z23 Encounter for immunization: Secondary | ICD-10-CM

## 2016-06-30 LAB — COMPREHENSIVE METABOLIC PANEL
ALBUMIN: 4.1 g/dL (ref 3.5–5.2)
ALK PHOS: 95 U/L (ref 39–117)
ALT: 11 U/L (ref 0–35)
AST: 17 U/L (ref 0–37)
BILIRUBIN TOTAL: 0.4 mg/dL (ref 0.2–1.2)
BUN: 17 mg/dL (ref 6–23)
CO2: 32 mEq/L (ref 19–32)
Calcium: 9.3 mg/dL (ref 8.4–10.5)
Chloride: 100 mEq/L (ref 96–112)
Creatinine, Ser: 1.02 mg/dL (ref 0.40–1.20)
GFR: 71.13 mL/min (ref >60.00)
GLUCOSE: 107 mg/dL — AB (ref 70–99)
Potassium: 3.7 mEq/L (ref 3.5–5.1)
SODIUM: 139 meq/L (ref 135–145)
TOTAL PROTEIN: 7.4 g/dL (ref 6.0–8.3)

## 2016-06-30 LAB — VITAMIN B12: Vitamin B-12: 303 pg/mL (ref 211–911)

## 2016-06-30 LAB — FERRITIN: Ferritin: 20.8 ng/mL (ref 10.0–291.0)

## 2016-06-30 LAB — CBC
HCT: 35.3 % — ABNORMAL LOW (ref 36.0–46.0)
Hemoglobin: 11.7 g/dL — ABNORMAL LOW (ref 12.0–15.0)
MCHC: 33.1 g/dL (ref 30.0–36.0)
MCV: 82.1 fl (ref 78.0–100.0)
Platelets: 248 10*3/uL (ref 150.0–400.0)
RBC: 4.3 Mil/uL (ref 3.87–5.11)
RDW: 16.5 % — AB (ref 11.5–15.5)
WBC: 9.3 10*3/uL (ref 4.0–10.5)

## 2016-06-30 LAB — TSH: TSH: 1.1 u[IU]/mL (ref 0.35–4.50)

## 2016-06-30 LAB — HEMOGLOBIN A1C: Hgb A1c MFr Bld: 6.7 % — ABNORMAL HIGH (ref 4.6–6.5)

## 2016-06-30 LAB — T4, FREE: FREE T4: 0.56 ng/dL — AB (ref 0.60–1.60)

## 2016-06-30 LAB — VITAMIN D 25 HYDROXY (VIT D DEFICIENCY, FRACTURES): VITD: 11 ng/mL — AB (ref 30.00–100.00)

## 2016-06-30 NOTE — Progress Notes (Signed)
Pre visit review using our clinic review tool, if applicable. No additional management support is needed unless otherwise documented below in the visit note. 

## 2016-06-30 NOTE — Patient Instructions (Signed)
We are checking the labs today.   We want you to start taking either iron pills daily or a multivitamin daily and when we get the labs we will know what you need.   If your symptoms are worsening call us back or come back.

## 2016-06-30 NOTE — Progress Notes (Signed)
   Subjective:    Patient ID: Jaime [name], female    DOB: Sep 26, 1955, 61 y.o.   MRN: [id number]  HPI The patient is a 61 YO female coming in for fatigue and low energy. Going on for some time but worse in the last month. No changes that she can notice to account for the change. Still trying to sleep and sleeping more lately but cannot get rested. Some SOB with activity. Some low blood counts in the past which have not been checked recently. No bleeding or dark stools that she has noticed. Some weight loss with decreased appetite in the last month. Sometimes feels like she might pass out. Has not passed out. No new depression symptoms or guilt. 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   Review of Systems  Constitutional: Positive for activity change, appetite change, fatigue and unexpected weight change. Negative for chills and fever.  HENT: Negative.   Respiratory: Positive for shortness of breath. Negative for cough, chest tightness and wheezing.   Cardiovascular: Negative.   Gastrointestinal: Negative.   Musculoskeletal: Negative.   Skin: Negative.   Neurological: Positive for dizziness, weakness and light-headedness. Negative for seizures, syncope, facial asymmetry, speech difficulty, numbness and headaches.  Psychiatric/Behavioral: Negative.       Objective:   Physical Exam  Constitutional: She is oriented to person, place, and time. She appears well-developed and well-nourished.  HENT:  Head: Normocephalic and atraumatic.  Eyes: EOM are normal.  Neck: Normal range of motion.  Cardiovascular: Normal rate and regular rhythm.   Pulmonary/Chest: Effort normal. No respiratory distress. She has no wheezes. She has no rales.  Abdominal: Soft. She exhibits no distension. There is no tenderness. There is no rebound.  Musculoskeletal: She exhibits no edema.  Neurological: She is alert and oriented to person, place, and time. Coordination normal.  No dizziness with standing.   Skin: Skin is  warm and dry.    Vitals:   06/30/16 1341  BP: 140/78  Pulse: 67  Resp: 14  Temp: 98 F (36.7 C)  TempSrc: Oral  SpO2: 100%  Weight: 186 lb (84.4 kg)  Height: 5\' 6"  (1.676 m)      Assessment & Plan:  Flu shot given at visit.

## 2016-07-01 ENCOUNTER — Other Ambulatory Visit: Payer: Self-pay | Admitting: Internal Medicine

## 2016-07-01 DIAGNOSIS — R5383 Other fatigue: Secondary | ICD-10-CM | POA: Insufficient documentation

## 2016-07-01 MED ORDER — VITAMIN D (ERGOCALCIFEROL) 1.25 MG (50000 UNIT) PO CAPS: 50000.0000 [IU] | ORAL_CAPSULE | ORAL | 0 refills | Status: DC

## 2016-07-01 NOTE — Assessment & Plan Note (Signed)
Prior iron deficiency anemia and worrisome for worsening anemia although no new blood loss. Checking CBC, CMP, ferritin, B12, TSH, free T4, vitamin d to exclude metabolic etiology. Adjust as needed.

## 2016-07-21 ENCOUNTER — Other Ambulatory Visit: Payer: Self-pay | Admitting: Internal Medicine

## 2016-08-01 DIAGNOSIS — M67431 Ganglion, right wrist: Secondary | ICD-10-CM | POA: Diagnosis not present

## 2016-08-01 DIAGNOSIS — M1711 Unilateral primary osteoarthritis, right knee: Secondary | ICD-10-CM | POA: Diagnosis not present

## 2016-08-18 ENCOUNTER — Other Ambulatory Visit: Payer: Self-pay

## 2016-08-18 DIAGNOSIS — L729 Follicular cyst of the skin and subcutaneous tissue, unspecified: Secondary | ICD-10-CM | POA: Diagnosis not present

## 2016-08-18 DIAGNOSIS — M67431 Ganglion, right wrist: Secondary | ICD-10-CM | POA: Diagnosis not present

## 2016-08-24 ENCOUNTER — Other Ambulatory Visit: Payer: Self-pay | Admitting: Internal Medicine

## 2016-08-25 DIAGNOSIS — M67431 Ganglion, right wrist: Secondary | ICD-10-CM | POA: Diagnosis not present

## 2016-11-09 DIAGNOSIS — Z9889 Other specified postprocedural states: Secondary | ICD-10-CM | POA: Diagnosis not present

## 2016-11-09 DIAGNOSIS — M67431 Ganglion, right wrist: Secondary | ICD-10-CM | POA: Diagnosis not present

## 2016-11-09 DIAGNOSIS — M1711 Unilateral primary osteoarthritis, right knee: Secondary | ICD-10-CM | POA: Diagnosis not present

## 2016-12-20 DIAGNOSIS — M25571 Pain in right ankle and joints of right foot: Secondary | ICD-10-CM | POA: Diagnosis not present

## 2017-01-10 ENCOUNTER — Encounter: Payer: Self-pay | Admitting: Nurse Practitioner

## 2017-01-10 ENCOUNTER — Ambulatory Visit (INDEPENDENT_AMBULATORY_CARE_PROVIDER_SITE_OTHER): Payer: 59 | Admitting: Nurse Practitioner

## 2017-01-10 ENCOUNTER — Other Ambulatory Visit (INDEPENDENT_AMBULATORY_CARE_PROVIDER_SITE_OTHER): Payer: 59

## 2017-01-10 VITALS — BP 140/82 | HR 76 | Temp 98.2°F | Ht 66.0 in | Wt 194.0 lb

## 2017-01-10 DIAGNOSIS — R1013 Epigastric pain: Secondary | ICD-10-CM

## 2017-01-10 DIAGNOSIS — K279 Peptic ulcer, site unspecified, unspecified as acute or chronic, without hemorrhage or perforation: Secondary | ICD-10-CM | POA: Diagnosis not present

## 2017-01-10 DIAGNOSIS — M79601 Pain in right arm: Secondary | ICD-10-CM

## 2017-01-10 LAB — COMPREHENSIVE METABOLIC PANEL
ALT: 12 U/L (ref 0–35)
AST: 19 U/L (ref 0–37)
Albumin: 3.7 g/dL (ref 3.5–5.2)
Alkaline Phosphatase: 85 U/L (ref 39–117)
BILIRUBIN TOTAL: 0.6 mg/dL (ref 0.2–1.2)
BUN: 10 mg/dL (ref 6–23)
CHLORIDE: 105 meq/L (ref 96–112)
CO2: 29 meq/L (ref 19–32)
CREATININE: 0.81 mg/dL (ref 0.40–1.20)
Calcium: 8.7 mg/dL (ref 8.4–10.5)
GFR: 92.64 mL/min (ref >60.00)
GLUCOSE: 112 mg/dL — AB (ref 70–99)
Potassium: 4 mEq/L (ref 3.5–5.1)
Sodium: 139 mEq/L (ref 135–145)
Total Protein: 6.6 g/dL (ref 6.0–8.3)

## 2017-01-10 LAB — CBC WITH DIFFERENTIAL/PLATELET
BASOS ABS: 0.1 10*3/uL (ref 0.0–0.1)
BASOS PCT: 1 % (ref 0.0–3.0)
EOS ABS: 0.4 10*3/uL (ref 0.0–0.7)
Eosinophils Relative: 5.1 % — ABNORMAL HIGH (ref 0.0–5.0)
HEMATOCRIT: 30.9 % — AB (ref 36.0–46.0)
Hemoglobin: 10.1 g/dL — ABNORMAL LOW (ref 12.0–15.0)
LYMPHS PCT: 27.6 % (ref 12.0–46.0)
Lymphs Abs: 2.2 10*3/uL (ref 0.7–4.0)
MCHC: 32.8 g/dL (ref 30.0–36.0)
MCV: 83.6 fl (ref 78.0–100.0)
MONO ABS: 0.6 10*3/uL (ref 0.1–1.0)
Monocytes Relative: 6.8 % (ref 3.0–12.0)
NEUTROS ABS: 4.8 10*3/uL (ref 1.4–7.7)
NEUTROS PCT: 59.5 % (ref 43.0–77.0)
PLATELETS: 262 10*3/uL (ref 150.0–400.0)
RBC: 3.69 Mil/uL — ABNORMAL LOW (ref 3.87–5.11)
RDW: 17 % — AB (ref 11.5–15.5)
WBC: 8.1 10*3/uL (ref 4.0–10.5)

## 2017-01-10 LAB — TROPONIN I: TNIDX: 0.01 ug/l (ref 0.00–0.06)

## 2017-01-10 LAB — LIPASE: Lipase: 10 U/L — ABNORMAL LOW (ref 11.0–59.0)

## 2017-01-10 LAB — AMYLASE: AMYLASE: 54 U/L (ref 27–131)

## 2017-01-10 MED ORDER — RANITIDINE HCL 300 MG PO TABS: 300.0000 mg | ORAL_TABLET | Freq: Every day | ORAL | 0 refills | Status: DC

## 2017-01-10 MED ORDER — KETOROLAC TROMETHAMINE 30 MG/ML IJ SOLN
30.0000 mg | Freq: Once | INTRAMUSCULAR | Status: AC
  Administered 2017-01-10: 30 mg via INTRAMUSCULAR

## 2017-01-10 NOTE — Patient Instructions (Addendum)
Go to ED if symptoms worsen. May also use tylenol 500mg  2tabs ever6-8hrs prn for right arm pain.  You will be contacted to schedule ABD [place]  Esophagitis Esophagitis is inflammation of the esophagus. The esophagus is the tube that carries food and liquids from your mouth to your stomach. Esophagitis can cause soreness or pain in the esophagus. This condition can make it difficult and painful to swallow. What are the causes? Most causes of esophagitis are not serious. Common causes of this condition include:  Gastroesophageal reflux disease (GERD). This is when stomach contents move back up into the esophagus (reflux).  Repeated vomiting.  An allergic-type reaction, especially caused by food allergies (eosinophilic esophagitis).  Injury to the esophagus by swallowing large pills with or without water, or swallowing certain types of medicines.  Swallowing (ingesting) harmful chemicals, such as household cleaning products.  Heavy alcohol use.  An infection of the esophagus.This most often occurs in people who have a weakened immune system.  Radiation or chemotherapy treatment for cancer.  Certain diseases such as sarcoidosis, Crohn disease, and scleroderma.  What are the signs or symptoms? Symptoms of this condition include:  Difficult or painful swallowing.  Pain with swallowing acidic liquids, such as citrus juices.  Pain with burping.  Chest pain.  Difficulty breathing.  Nausea.  Vomiting.  Pain in the abdomen.  Weight loss.  Ulcers in the mouth.  Patches of white material in the mouth (candidiasis).  Fever.  Coughing up blood or vomiting blood.  Stool that is black, tarry, or bright red.  How is this diagnosed? Your health care provider will take a medical history and perform a physical exam. You may also have other tests, including:  An endoscopy to examine your stomach and esophagus with a small camera.  A test that measures the acidity level in your  esophagus.  A test that measures how much pressure is on your esophagus.  A barium swallow or modified barium swallow to show the shape, size, and functioning of your esophagus.  Allergy tests.  How is this treated? Treatment for this condition depends on the cause of your esophagitis. In some cases, steroids or other medicines may be given to help relieve your symptoms or to treat the underlying cause of your condition. You may have to make some lifestyle changes, such as:  Avoiding alcohol.  Quitting smoking.  Changing your diet.  Exercising.  Changing your sleep habits and your sleep environment.  Follow these instructions at home: Take these actions to decrease your discomfort and to help avoid complications. Diet  Follow a diet as recommended by your health care provider. This may involve avoiding foods and drinks such as: ? Coffee and tea (with or without caffeine). ? Drinks that contain alcohol. ? Energy drinks and sports drinks. ? Carbonated drinks or sodas. ? Chocolate and cocoa. ? Peppermint and mint flavorings. ? Garlic and onions. ? Horseradish. ? Spicy and acidic foods, including peppers, chili powder, curry powder, vinegar, hot sauces, and barbecue sauce. ? Citrus fruit juices and citrus fruits, such as oranges, lemons, and limes. ? Tomato-based foods, such as red sauce, chili, salsa, and pizza with red sauce. ? Fried and fatty foods, such as donuts, french fries, potato chips, and high-fat dressings. ? High-fat meats, such as hot dogs and fatty cuts of red and white meats, such as rib eye steak, sausage, ham, and bacon. ? High-fat dairy items, such as whole milk, butter, and cream cheese.  Eat small, frequent meals instead  of large meals.  Avoid drinking large amounts of liquid with your meals.  Avoid eating meals during the 2-3 hours before bedtime.  Avoid lying down right after you eat.  Do not exercise right after you eat.  Avoid foods and drinks  that seem to make your symptoms worse. General instructions  Pay attention to any changes in your symptoms.  Take over-the-counter and prescription medicines only as told by your health care provider. Do not take aspirin, ibuprofen, or other NSAIDs unless your health care provider told you to do so.  If you have trouble taking pills, use a pill splitter to decrease the size of the pill. This will decrease the chance of the pill getting stuck or injuring your esophagus on the way down. Also, drink water after you take a pill.  Do not use any tobacco products, including cigarettes, chewing tobacco, and e-cigarettes. If you need help quitting, ask your health care provider.  Wear loose-fitting clothing. Do not wear anything tight around your waist that causes pressure on your abdomen.  Raise (elevate) the head of your bed about 6 inches (15 cm).  Try to reduce your stress, such as with yoga or meditation. If you need help reducing stress, ask your health care provider.  If you are overweight, reduce your weight to an amount that is healthy for you. Ask your health care provider for guidance about a safe weight loss goal.  Keep all follow-up visits as told by your health care provider. This is important. Contact a health care provider if:  You have new symptoms.  You have unexplained weight loss.  You have difficulty swallowing, or it hurts to swallow.  You have wheezing or a persistent cough.  Your symptoms do not improve with treatment.  You have frequent heartburn for more than two weeks. Get help right away if:  You have severe pain in your arms, neck, jaw, teeth, or back.  You feel sweaty, dizzy, or light-headed.  You have chest pain or shortness of breath.  You vomit and your vomit looks like blood or coffee grounds.  Your stool is bloody or black.  You have a fever.  You cannot swallow, drink, or eat. This information is not intended to replace advice given to you  by your health care provider. Make sure you discuss any questions you have with your health care provider. Document Released: 07/14/2004 Document Revised: 11/12/2015 Document Reviewed: 10/01/2014 Elsevier Interactive Patient Education  [employer].

## 2017-01-10 NOTE — Progress Notes (Signed)
Subjective:  Patient ID: Jaime [name], female    DOB: 04-14-1956  Age: 61 y.o. MRN: [id number]  CC: Pain (right arm pain/discomfort near lower sternum go under left breast area. no energy,tired)   Abdominal Pain  This is a new problem. The current episode started in the past 7 days. The onset quality is sudden. The problem occurs constantly. The problem has been unchanged. The pain is located in the epigastric region. The pain is at a severity of 8/10. The quality of the pain is dull. The abdominal pain radiates to the right shoulder (and right arm). Associated symptoms include anorexia and belching. Pertinent negatives include no constipation, diarrhea, fever, flatus, hematochezia, melena, myalgias, nausea or vomiting. Nothing aggravates the pain. The pain is relieved by nothing. She has tried proton pump inhibitors for the symptoms. The treatment provided no relief. Her past medical history is significant for GERD. FHx of CAD, personal hx of carotid stenosis, no tobacco use, no NSAID use    Outpatient Medications Prior to Visit  Medication Sig Dispense Refill  . aspirin 325 MG tablet Take 81 mg by mouth daily.     Marland Kitchen atenolol (TENORMIN) 100 MG tablet TAKE 1 TABLET BY MOUTH EVERY DAY 30 tablet 7  . buPROPion (WELLBUTRIN XL) 150 MG 24 hr tablet TAKE 1 TABLET BY MOUTH EVERY DAY 30 tablet 4  . gabapentin (NEURONTIN) 100 MG capsule Take 1 capsule (100 mg total) by mouth 3 (three) times daily. 90 capsule 3  . hydrochlorothiazide (HYDRODIURIL) 25 MG tablet TAKE 1 TABLET BY MOUTH EVERY DAY 90 tablet 2  . Multiple Vitamins-Minerals (MULTIVITAMINS THER. W/MINERALS) TABS Take 1 tablet by mouth as needed.     . pantoprazole (PROTONIX) 40 MG tablet TAKE 1 TABLET BY MOUTH EVERY MORNING 30 tablet 7  . zolpidem (AMBIEN) 10 MG tablet Take 1 tablet (10 mg total) by mouth at bedtime as needed for sleep. 30 tablet 3  . PROAIR RESPICLICK 353 (90 BASE) MCG/ACT AEPB Inhale 2 puffs into the lungs every 6 (six)  hours as needed (SOB, wheezing). (Patient not taking: Reported on 01/10/2017) 1 each 0  . Vitamin D, Ergocalciferol, (DRISDOL) 50000 units CAPS capsule TAKE ONE CAPSULE EVERY 7 DAYS (Patient not taking: Reported on 01/10/2017) 8 capsule 0   No facility-administered medications prior to visit.     ROS See HPI  Objective:  BP 140/82   Pulse 76   Temp 98.2 F (36.8 C)   Ht 5\' 6"  (1.676 m)   Wt 194 lb (88 kg)   SpO2 98%   BMI 31.31 kg/m   BP Readings from Last 3 Encounters:  01/10/17 140/82  06/30/16 140/78  01/26/16 132/80    Wt Readings from Last 3 Encounters:  01/10/17 194 lb (88 kg)  06/30/16 186 lb (84.4 kg)  01/26/16 193 lb 1.9 oz (87.6 kg)    Physical Exam  Constitutional: She is oriented to person, place, and time. No distress.  Neck: No JVD present.  Cardiovascular: Normal rate, regular rhythm and normal heart sounds.   No murmur heard. Pulmonary/Chest: Effort normal and breath sounds normal.  Abdominal: Soft. Bowel sounds are normal. She exhibits no distension. There is tenderness. There is no guarding.  Epigastric tenderness  Musculoskeletal: Normal range of motion. She exhibits no edema or tenderness.       Right shoulder: Normal.       Right elbow: Normal.      Right wrist: Normal.       Cervical  back: Normal.       Thoracic back: Normal.       Right upper arm: Normal.       Right forearm: Normal.       Right hand: Normal.  Neurological: She is alert and oriented to person, place, and time.  Skin: Skin is warm and dry.  Vitals reviewed.   Lab Results  Component Value Date   WBC 8.1 01/10/2017   HGB 10.1 (L) 01/10/2017   HCT 30.9 (L) 01/10/2017   PLT 262.0 01/10/2017   GLUCOSE 112 (H) 01/10/2017   CHOL 160 01/26/2016   TRIG 69.0 01/26/2016   HDL 42.40 01/26/2016   LDLCALC 104 (H) 01/26/2016   ALT 12 01/10/2017   AST 19 01/10/2017   NA 139 01/10/2017   K 4.0 01/10/2017   CL 105 01/10/2017   CREATININE 0.81 01/10/2017   BUN 10 01/10/2017    CO2 29 01/10/2017   TSH 1.10 06/30/2016   HGBA1C 6.7 (H) 06/30/2016    Mm Digital Screening Bilateral  Result Date: 10/13/2015 CLINICAL DATA:  Screening. EXAM: DIGITAL SCREENING BILATERAL MAMMOGRAM WITH CAD COMPARISON:  Previous exam(s). ACR Breast Density Category b: There are scattered areas of fibroglandular density. FINDINGS: There are no findings suspicious for malignancy. Images were processed with CAD. IMPRESSION: No mammographic evidence of malignancy. A result letter of this screening mammogram will be mailed directly to the patient. RECOMMENDATION: Screening mammogram in one year. (Code:SM-B-01Y) BI-RADS CATEGORY  1: Negative. Electronically Signed   By: Nolon Nations M.D.   On: 10/13/2015 15:44   ECG: NSR with non specific T-wave inversion.  Negative troponin, lipase and liver enzymes.  Assessment & Plan:  Collaborated with Dr. Ronnald Ramp.  Simren was seen today for pain.  Diagnoses and all orders for this visit:  Acute epigastric pain -     EKG 12-Lead -     Troponin I; Future -     CBC w/Diff; Future -     Comprehensive metabolic panel; Future -     Lipase; Future -     Amylase; Future -     ketorolac (TORADOL) 30 MG/ML injection 30 mg; Inject 1 mL (30 mg total) into the muscle once. -     US Abdomen Limited RUQ; Future -     ranitidine (ZANTAC) 300 MG tablet; Take 1 tablet (300 mg total) by mouth at bedtime.  Musculoskeletal arm pain, right  Peptic ulcer -     ranitidine (ZANTAC) 300 MG tablet; Take 1 tablet (300 mg total) by mouth at bedtime.   I have discontinued Ms. Winship's PROAIR RESPICLICK and Vitamin D (Ergocalciferol). I am also having her start on ranitidine. Additionally, I am having her maintain her multivitamins ther. w/minerals, aspirin, zolpidem, gabapentin, buPROPion, hydrochlorothiazide, atenolol, and pantoprazole. We administered ketorolac.  Meds ordered this encounter  Medications  . ketorolac (TORADOL) 30 MG/ML injection 30 mg  . ranitidine  (ZANTAC) 300 MG tablet    Sig: Take 1 tablet (300 mg total) by mouth at bedtime.    Dispense:  30 tablet    Refill:  0    Order Specific Question:   Supervising Provider    Answer:   Cassandria Anger [[id number]]    Follow-up: Return if symptoms worsen or fail to improve.  Wilfred Lacy, NP

## 2017-01-19 ENCOUNTER — Ambulatory Visit
Admission: RE | Admit: 2017-01-19 | Discharge: 2017-01-19 | Disposition: A | Discharge status: Home / Self Care | Payer: 59 | Source: Ambulatory Visit | Attending: Nurse Practitioner | Admitting: Nurse Practitioner

## 2017-01-19 DIAGNOSIS — R1013 Epigastric pain: Secondary | ICD-10-CM

## 2017-01-30 DIAGNOSIS — M542 Cervicalgia: Secondary | ICD-10-CM | POA: Diagnosis not present

## 2017-02-21 DIAGNOSIS — M542 Cervicalgia: Secondary | ICD-10-CM | POA: Diagnosis not present

## 2017-02-27 DIAGNOSIS — M545 Low back pain: Secondary | ICD-10-CM | POA: Diagnosis not present

## 2017-02-28 DIAGNOSIS — M5412 Radiculopathy, cervical region: Secondary | ICD-10-CM | POA: Diagnosis not present

## 2017-03-16 DIAGNOSIS — M5412 Radiculopathy, cervical region: Secondary | ICD-10-CM | POA: Diagnosis not present

## 2017-04-03 ENCOUNTER — Encounter (HOSPITAL_COMMUNITY): Payer: Self-pay | Admitting: Emergency Medicine

## 2017-04-03 DIAGNOSIS — Z23 Encounter for immunization: Secondary | ICD-10-CM | POA: Diagnosis not present

## 2017-04-03 DIAGNOSIS — S61210A Laceration without foreign body of right index finger without damage to nail, initial encounter: Secondary | ICD-10-CM | POA: Diagnosis not present

## 2017-04-03 DIAGNOSIS — Z7982 Long term (current) use of aspirin: Secondary | ICD-10-CM | POA: Insufficient documentation

## 2017-04-03 DIAGNOSIS — Z87891 Personal history of nicotine dependence: Secondary | ICD-10-CM | POA: Diagnosis not present

## 2017-04-03 DIAGNOSIS — W260XXA Contact with knife, initial encounter: Secondary | ICD-10-CM | POA: Insufficient documentation

## 2017-04-03 DIAGNOSIS — Z79899 Other long term (current) drug therapy: Secondary | ICD-10-CM | POA: Diagnosis not present

## 2017-04-03 DIAGNOSIS — I10 Essential (primary) hypertension: Secondary | ICD-10-CM | POA: Diagnosis not present

## 2017-04-03 DIAGNOSIS — Y9389 Activity, other specified: Secondary | ICD-10-CM | POA: Insufficient documentation

## 2017-04-03 DIAGNOSIS — Y998 Other external cause status: Secondary | ICD-10-CM | POA: Diagnosis not present

## 2017-04-03 DIAGNOSIS — S61310A Laceration without foreign body of right index finger with damage to nail, initial encounter: Secondary | ICD-10-CM | POA: Diagnosis not present

## 2017-04-03 DIAGNOSIS — Y929 Unspecified place or not applicable: Secondary | ICD-10-CM | POA: Diagnosis not present

## 2017-04-03 NOTE — ED Triage Notes (Signed)
Pt states she cut her left index finger with a knife opening a bag of dog food  Bleeding controlled

## 2017-04-04 ENCOUNTER — Emergency Department (HOSPITAL_COMMUNITY)
Admission: EM | Admit: 2017-04-04 | Discharge: 2017-04-04 | Disposition: A | Discharge status: Home / Self Care | Payer: 59 | Attending: Emergency Medicine | Admitting: Emergency Medicine

## 2017-04-04 DIAGNOSIS — S61210A Laceration without foreign body of right index finger without damage to nail, initial encounter: Secondary | ICD-10-CM | POA: Diagnosis not present

## 2017-04-04 HISTORY — DX: Gastro-esophageal reflux disease without esophagitis: K21.9

## 2017-04-04 HISTORY — DX: Essential (primary) hypertension: I10

## 2017-04-04 MED ORDER — TETANUS-DIPHTH-ACELL PERTUSSIS 5-2.5-18.5 LF-MCG/0.5 IM SUSP
0.5000 mL | Freq: Once | INTRAMUSCULAR | Status: AC
  Administered 2017-04-04: 0.5 mL via INTRAMUSCULAR
  Filled 2017-04-04: qty 0.5

## 2017-04-04 MED ORDER — BACITRACIN ZINC 500 UNIT/GM EX OINT
TOPICAL_OINTMENT | CUTANEOUS | Status: AC
  Filled 2017-04-04: qty 0.9

## 2017-04-04 MED ORDER — LIDOCAINE HCL (PF) 2 % IJ SOLN
INTRAMUSCULAR | Status: AC
  Filled 2017-04-04: qty 10

## 2017-04-04 MED ORDER — BACITRACIN ZINC 500 UNIT/GM EX OINT
TOPICAL_OINTMENT | Freq: Two times a day (BID) | CUTANEOUS | Status: DC
  Administered 2017-04-04: 02:00:00 via TOPICAL

## 2017-04-04 NOTE — ED Provider Notes (Signed)
[facility] DEPT Provider Note   CSN: [id number] Arrival date & time: 04/03/17  2311     History   Chief Complaint Chief Complaint  Patient presents with  . Extremity Laceration    HPI LERLENE [name] is a 61 y.o. female.  HPI 61 year old female presents to the ED for evaluation of laceration of left index finger. Patient states that prior to arrival she was opening a bag of dog food when the knife cut her left index finger. Bleeding is controlled. Patient unsure of when her last tetanus shot was. Patient reports some pain with movement of the finger. Denies any associated paresthesias or weakness. She has not taking for her pain prior to arrival. Moving makes the pain worse. Holding still makes the pain better. Past Medical History:  Diagnosis Date  . Endometriosis   . GERD (gastroesophageal reflux disease)   . Hypertension   . Meningioma ([place])   . Migraine   . Nontoxic multinodular goiter   . 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 Peptic ulcer disease   . PVD (peripheral vascular disease) [facility])     Patient Active Problem List   Diagnosis Date Noted  . Fatigue 07/01/2016  . Impaired fasting blood sugar 08/02/2015  . Routine general medical examination at a health care facility 03/29/2015  . Depression 04/08/2013  . Hilar adenopathy 09/08/2011  . Anemia, iron deficiency 09/30/2010  . Essential hypertension 06/09/2009  . Peptic ulcer 05/14/2007  . MENINGIOMA 02/06/2007  . GOITER, MULTINODULAR 02/06/2007  . TIC 02/06/2007  . CAROTID ARTERY STENOSIS, RIGHT 02/06/2007  . PERIPHERAL VASCULAR DISEASE 02/06/2007    Past Surgical History:  Procedure Laterality Date  . CT RADIATION THERAPY GUIDE     CT done yearly for menigioma with radiation if needed  . cyst removed     left wrist  . ORIF ANKLE FRACTURE     right  . OVARIAN CYST REMOVAL     right  . TONSILLECTOMY  1965  . TRIGGER FINGER  RELEASE  2012   right thumb  . TUBAL LIGATION    . VIDEO BRONCHOSCOPY  11/10/2011   Procedure: VIDEO BRONCHOSCOPY WITH FLUORO;  Surgeon: Tanda Rockers, MD;  Location: [facility];  Service: Cardiopulmonary;  Laterality: Bilateral;    OB History    No data available       Home Medications    Prior to Admission medications   Medication Sig Start Date End Date Taking? Authorizing Provider  aspirin 325 MG tablet Take 81 mg by mouth daily.     [provider]  atenolol (TENORMIN) 100 MG tablet TAKE 1 TABLET BY MOUTH EVERY DAY 05/05/16   Hoyt Koch, MD  buPROPion (WELLBUTRIN XL) 150 MG 24 hr tablet TAKE 1 TABLET BY MOUTH EVERY DAY 01/04/16   Hoyt Koch, MD  gabapentin (NEURONTIN) 100 MG capsule Take 1 capsule (100 mg total) by mouth 3 (three) times daily. 07/28/15   Hoyt Koch, MD  hydrochlorothiazide (HYDRODIURIL) 25 MG tablet TAKE 1 TABLET BY MOUTH EVERY DAY 03/28/16   Hoyt Koch, MD  Multiple Vitamins-Minerals (MULTIVITAMINS THER. W/MINERALS) TABS Take 1 tablet by mouth as needed.     [provider]  pantoprazole (PROTONIX) 40 MG tablet TAKE 1 TABLET BY MOUTH EVERY MORNING 07/21/16   Hoyt Koch, MD  ranitidine (ZANTAC) 300 MG tablet Take 1 tablet (300 mg total) by mouth at bedtime. 01/10/17   Nche,  Charlene Brooke, NP  zolpidem (AMBIEN) 10 MG tablet Take 1 tablet (10 mg total) by mouth at bedtime as needed for sleep. 04/28/15   Hoyt Koch, MD    Family History Family History  Problem Relation Age of Onset  . Hypertension Mother   . Diabetes Mother   . Heart disease Mother   . Heart failure Mother   . Cancer Mother   . Dementia Mother   . Esophageal cancer Father        was a smoker  . Heart disease Father   . Colon cancer Maternal Grandmother   . Dementia Brother   . Breast cancer Neg Hx   . Coronary artery disease Neg Hx     Social History Social History  Substance Use Topics  . Smoking status:  Former Smoker    Packs/day: 0.30    Years: 3.00    Types: Cigarettes    Quit date: 06/20/1981  . Smokeless tobacco: Never Used  . Alcohol use No     Allergies   Patient has no known allergies.   Review of Systems Review of Systems  Musculoskeletal: Positive for myalgias.  Skin: Positive for wound.  Neurological: Negative for weakness and numbness.     Physical Exam Updated Vital Signs BP (!) 168/93 (BP Location: Right Arm)   Pulse 72   Temp 98.2 F (36.8 C) (Oral)   Resp 16   SpO2 98%   Physical Exam  Constitutional: She appears well-developed and well-nourished. No distress.  HENT:  Head: Normocephalic and atraumatic.  Eyes: Right eye exhibits no discharge. Left eye exhibits no discharge. No scleral icterus.  Neck: Normal range of motion.  Cardiovascular: Intact distal pulses.   Pulmonary/Chest: No respiratory distress.  Musculoskeletal: Normal range of motion.  Patient with approximately half centimeter very superficial laceration to the radial aspect of the left index finger over the DIP. Patient has full range of motion of the left DIP and PIP.Refill is normal. Sensation intact in all dermatomes. Radial pulses 2+ bilaterally. Bleeding is controlled.  Neurological: She is alert.  Skin: Skin is warm and dry. Capillary refill takes less than 2 seconds. No pallor.  Psychiatric: Her behavior is normal. Judgment and thought content normal.  Nursing note and vitals reviewed.    ED Treatments / Results  Labs (all labs ordered are listed, but only abnormal results are displayed) Labs Reviewed - No data to display  EKG  EKG Interpretation None       Radiology No results found.  Procedures .Marland KitchenLaceration Repair Date/Time: 04/04/2017 5:38 AM Performed by: Doristine Devoid Authorized by: Ocie Cornfield T   Consent:    Consent obtained:  Verbal   Consent given by:  Patient   Risks discussed:  Infection, need for additional repair, nerve damage, poor  wound healing, poor cosmetic result, pain, retained foreign body, tendon damage and vascular damage   Alternatives discussed:  No treatment Anesthesia (see MAR for exact dosages):    Anesthesia method:  Nerve block   Block location:  Finger   Block needle gauge:  25 G   Block anesthetic:  Lidocaine 1% w/o epi   Block injection procedure:  Anatomic landmarks identified, introduced needle, incremental injection, negative aspiration for blood and anatomic landmarks palpated   Block outcome:  Anesthesia achieved Laceration details:    Location:  Finger   Finger location:  L index finger   Length (cm):  0.5   Depth (mm):  1 Repair type:  Repair type:  Simple Pre-procedure details:    Preparation:  Patient was prepped and draped in usual sterile fashion Exploration:    Hemostasis achieved with:  Direct pressure   Wound exploration: wound explored through full range of motion and entire depth of wound probed and visualized     Wound extent: no foreign bodies/material noted     Contaminated: no   Treatment:    Area cleansed with:  Betadine and saline   Amount of cleaning:  Standard   Irrigation solution:  Sterile saline   Irrigation volume:  60   Irrigation method:  Pressure wash   Visualized foreign bodies/material removed: no   Skin repair:    Repair method:  Sutures   Suture size:  5-0   Suture material:  Prolene   Suture technique:  Simple interrupted   Number of sutures:  4 Approximation:    Approximation:  Close   Vermilion border: well-aligned   Post-procedure details:    Dressing:  Splint for protection and antibiotic ointment   Patient tolerance of procedure:  Tolerated well, no immediate complications   (including critical care time)  Medications Ordered in ED Medications  Tdap (BOOSTRIX) injection 0.5 mL (0.5 mLs Intramuscular Given 04/04/17 0212)     Initial Impression / Assessment and Plan / ED Course  I have reviewed the triage vital signs and the nursing  notes.  Pertinent labs & imaging results that were available during my care of the patient were reviewed by me and considered in my medical decision making (see chart for details).     Tdap booster given. Pressure irrigation performed. Laceration occurred < 8 hours prior to repair which was well tolerated. Pt has no co morbidities to effect normal wound healing. Finger plint placed to protect laceration. Patient's full range of motion. No pain with range of motion. Doubt fracture. Do not feel imaging is indicated.Discussed suture home care w pt and answered questions. Pt to f-u for wound check and suture removal in 7 days. Pt is hemodynamically stable w no complaints prior to dc.     Final Clinical Impressions(s) / ED Diagnoses   Final diagnoses:  Laceration of right index finger without foreign body without damage to nail, initial encounter    New Prescriptions Discharge Medication List as of 04/04/2017  2:09 AM       Doristine Devoid, PA-C 04/04/17 0540    Ward, Delice Bison, DO 04/04/17 4585

## 2017-04-04 NOTE — Discharge Instructions (Signed)
WOUND CARE Please have your stitches/staples removed in 7-10 or sooner if you have concerns. You may do this at any available urgent care or at your primary care doctor's office.  Keep area clean and dry for 24 hours. Do not remove bandage, if applied.  After 24 hours, remove bandage and wash wound gently with mild soap and warm water. Reapply a new bandage after cleaning wound, if directed.  Continue daily cleansing with soap and water until stitches/staples are removed.  Do not apply any ointments or creams to the wound while stitches/staples are in place, as this may cause delayed healing.  Seek medical careif you experience any of the following signs of infection: Swelling, redness, pus drainage, streaking, fever >101.0 F  Seek care if you experience excessive bleeding that does not stop after 15-20 minutes of constant, firm pressure.

## 2017-04-11 ENCOUNTER — Other Ambulatory Visit: Payer: Self-pay | Admitting: Internal Medicine

## 2017-04-11 ENCOUNTER — Encounter (HOSPITAL_COMMUNITY): Payer: Self-pay | Admitting: Emergency Medicine

## 2017-04-11 ENCOUNTER — Emergency Department (HOSPITAL_COMMUNITY)
Admission: EM | Admit: 2017-04-11 | Discharge: 2017-04-11 | Disposition: A | Discharge status: Home / Self Care | Payer: 59 | Attending: Emergency Medicine | Admitting: Emergency Medicine

## 2017-04-11 DIAGNOSIS — X58XXXA Exposure to other specified factors, initial encounter: Secondary | ICD-10-CM | POA: Diagnosis not present

## 2017-04-11 DIAGNOSIS — F329 Major depressive disorder, single episode, unspecified: Secondary | ICD-10-CM | POA: Diagnosis not present

## 2017-04-11 DIAGNOSIS — Z7982 Long term (current) use of aspirin: Secondary | ICD-10-CM | POA: Insufficient documentation

## 2017-04-11 DIAGNOSIS — I10 Essential (primary) hypertension: Secondary | ICD-10-CM | POA: Insufficient documentation

## 2017-04-11 DIAGNOSIS — Z79899 Other long term (current) drug therapy: Secondary | ICD-10-CM | POA: Insufficient documentation

## 2017-04-11 DIAGNOSIS — S61211D Laceration without foreign body of left index finger without damage to nail, subsequent encounter: Secondary | ICD-10-CM | POA: Insufficient documentation

## 2017-04-11 DIAGNOSIS — Z87891 Personal history of nicotine dependence: Secondary | ICD-10-CM | POA: Diagnosis not present

## 2017-04-11 DIAGNOSIS — Z4802 Encounter for removal of sutures: Secondary | ICD-10-CM | POA: Diagnosis not present

## 2017-04-11 NOTE — ED Notes (Signed)
Bed: [facility] Expected date:  Expected time:  Means of arrival:  Comments: 

## 2017-04-11 NOTE — ED Triage Notes (Signed)
Patient reports that she needs to get sutures taken out of left index fringer that were placed week ago. Patient has wrapped in coban and reports no drainage or issues from it since last dressing change on Sunday.

## 2017-04-11 NOTE — ED Notes (Signed)
ED Provider at bedside. 

## 2017-04-11 NOTE — ED Provider Notes (Signed)
[facility] DEPT Provider Note   CSN: [id number] Arrival date & time: 04/11/17  7858     History   Chief Complaint Chief Complaint  Patient presents with  . Suture / Staple Removal    HPI Jaime [name] is a 61 y.o. female.  61 year old female who presents for suture removal.  One week ago she had sutures placed in her left index finger and is here for suture removal.  She has been changing bandages.  No fevers, drainage, redness, or increased pain.  No complaints.   The history is provided by the patient.  Suture / Staple Removal     Past Medical History:  Diagnosis Date  . Endometriosis   . GERD (gastroesophageal reflux disease)   . Hypertension   . Meningioma ([place])   . Migraine   . Nontoxic multinodular goiter   . 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 Peptic ulcer disease   . PVD (peripheral vascular disease) [facility])     Patient Active Problem List   Diagnosis Date Noted  . Fatigue 07/01/2016  . Impaired fasting blood sugar 08/02/2015  . Routine general medical examination at a health care facility 03/29/2015  . Depression 04/08/2013  . Hilar adenopathy 09/08/2011  . Anemia, iron deficiency 09/30/2010  . Essential hypertension 06/09/2009  . Peptic ulcer 05/14/2007  . MENINGIOMA 02/06/2007  . GOITER, MULTINODULAR 02/06/2007  . TIC 02/06/2007  . CAROTID ARTERY STENOSIS, RIGHT 02/06/2007  . PERIPHERAL VASCULAR DISEASE 02/06/2007    Past Surgical History:  Procedure Laterality Date  . CT RADIATION THERAPY GUIDE     CT done yearly for menigioma with radiation if needed  . cyst removed     left wrist  . ORIF ANKLE FRACTURE     right  . OVARIAN CYST REMOVAL     right  . TONSILLECTOMY  1965  . TRIGGER FINGER RELEASE  2012   right thumb  . TUBAL LIGATION    . VIDEO BRONCHOSCOPY  11/10/2011   Procedure: VIDEO BRONCHOSCOPY WITH FLUORO;  Surgeon: Tanda Rockers, MD;  Location: [facility];  Service: Cardiopulmonary;  Laterality: Bilateral;    OB History    No data available       Home Medications    Prior to Admission medications   Medication Sig Start Date End Date Taking? Authorizing Provider  aspirin 325 MG tablet Take 81 mg by mouth daily.     [provider]  atenolol (TENORMIN) 100 MG tablet TAKE 1 TABLET BY MOUTH EVERY DAY 05/05/16   Hoyt Koch, MD  buPROPion (WELLBUTRIN XL) 150 MG 24 hr tablet TAKE 1 TABLET BY MOUTH EVERY DAY 01/04/16   Hoyt Koch, MD  gabapentin (NEURONTIN) 100 MG capsule Take 1 capsule (100 mg total) by mouth 3 (three) times daily. 07/28/15   Hoyt Koch, MD  hydrochlorothiazide (HYDRODIURIL) 25 MG tablet TAKE 1 TABLET BY MOUTH EVERY DAY 03/28/16   Hoyt Koch, MD  Multiple Vitamins-Minerals (MULTIVITAMINS THER. W/MINERALS) TABS Take 1 tablet by mouth as needed.     [provider]  pantoprazole (PROTONIX) 40 MG tablet TAKE 1 TABLET BY MOUTH EVERY MORNING 07/21/16   Hoyt Koch, MD  ranitidine (ZANTAC) 300 MG tablet Take 1 tablet (300 mg total) by mouth at bedtime. 01/10/17   Nche, Charlene Brooke, NP  zolpidem (AMBIEN) 10 MG tablet Take 1 tablet (10 mg total) by mouth at bedtime as needed for  sleep. 04/28/15   Hoyt Koch, MD    Family History Family History  Problem Relation Age of Onset  . Hypertension Mother   . Diabetes Mother   . Heart disease Mother   . Heart failure Mother   . Cancer Mother   . Dementia Mother   . Esophageal cancer Father        was a smoker  . Heart disease Father   . Colon cancer Maternal Grandmother   . Dementia Brother   . Breast cancer Neg Hx   . Coronary artery disease Neg Hx     Social History Social History  Substance Use Topics  . Smoking status: Former Smoker    Packs/day: 0.30    Years: 3.00    Types: Cigarettes    Quit date: 06/20/1981  . Smokeless tobacco: Never Used  . Alcohol use No     Allergies     Patient has no known allergies.   Review of Systems Review of Systems  Constitutional: Negative for fever.  Skin: Negative for color change and rash.  Neurological: Negative for numbness.     Physical Exam Updated Vital Signs BP (!) 151/94 (BP Location: Left Arm)   Pulse 93   Temp 98.2 F (36.8 C) (Oral)   Resp 16   Ht 5\' 6"  (1.676 m)   Wt 88 kg (194 lb)   SpO2 99%   BMI 31.31 kg/m   Physical Exam  Constitutional: She is oriented to person, place, and time. She appears well-developed and well-nourished. No distress.  HENT:  Head: Normocephalic and atraumatic.  Eyes: Conjunctivae are normal.  Neck: Neck supple.  Musculoskeletal: She exhibits no edema.  Neurological: She is alert and oriented to person, place, and time.  Skin: Skin is warm and dry. No erythema.  Linear healed laceration with sutures in place on distal L index finger, radial side, next to DIP joint  Psychiatric: She has a normal mood and affect. Judgment normal.  Nursing note and vitals reviewed.    ED Treatments / Results  Labs (all labs ordered are listed, but only abnormal results are displayed) Labs Reviewed - No data to display  EKG  EKG Interpretation None       Radiology No results found.  Procedures .Suture Removal Date/Time: 04/11/2017 9:46 AM Performed by: Sharlett Iles Authorized by: Sharlett Iles   Consent:    Consent obtained:  Verbal   Consent given by:  Patient Location:    Location:  Upper extremity   Upper extremity location:  Hand   Hand location:  L index finger Procedure details:    Wound appearance:  No signs of infection, good wound healing, clean, warm and tender   Number of sutures removed:  4 Post-procedure details:    Post-removal:  No dressing applied   Patient tolerance of procedure:  Tolerated well, no immediate complications   (including critical care time)  Medications Ordered in ED Medications - No data to display   Initial  Impression / Assessment and Plan / ED Course  I have reviewed the triage vital signs and the nursing notes.     No signs/sx of infection, removed sutures and discussed supportive measures and return precautions.  Final Clinical Impressions(s) / ED Diagnoses   Final diagnoses:  Encounter for removal of sutures    New Prescriptions New Prescriptions   No medications on file     Morgann Woodburn, Wenda Overland, MD 04/11/17 [phone]

## 2017-04-14 DIAGNOSIS — H35372 Puckering of macula, left eye: Secondary | ICD-10-CM | POA: Diagnosis not present

## 2017-04-14 DIAGNOSIS — H25813 Combined forms of age-related cataract, bilateral: Secondary | ICD-10-CM | POA: Diagnosis not present

## 2017-04-14 DIAGNOSIS — D329 Benign neoplasm of meninges, unspecified: Secondary | ICD-10-CM | POA: Diagnosis not present

## 2017-05-02 ENCOUNTER — Encounter: Payer: Self-pay | Admitting: Internal Medicine

## 2017-05-02 ENCOUNTER — Other Ambulatory Visit: Payer: Self-pay | Admitting: Internal Medicine

## 2017-05-02 ENCOUNTER — Ambulatory Visit (INDEPENDENT_AMBULATORY_CARE_PROVIDER_SITE_OTHER): Payer: 59 | Admitting: Internal Medicine

## 2017-05-02 ENCOUNTER — Other Ambulatory Visit (INDEPENDENT_AMBULATORY_CARE_PROVIDER_SITE_OTHER): Payer: 59

## 2017-05-02 VITALS — BP 130/90 | HR 60 | Temp 98.1°F | Ht 66.0 in | Wt 195.0 lb

## 2017-05-02 DIAGNOSIS — R1013 Epigastric pain: Secondary | ICD-10-CM | POA: Diagnosis not present

## 2017-05-02 DIAGNOSIS — Z23 Encounter for immunization: Secondary | ICD-10-CM

## 2017-05-02 DIAGNOSIS — D649 Anemia, unspecified: Secondary | ICD-10-CM

## 2017-05-02 DIAGNOSIS — K279 Peptic ulcer, site unspecified, unspecified as acute or chronic, without hemorrhage or perforation: Secondary | ICD-10-CM

## 2017-05-02 DIAGNOSIS — D500 Iron deficiency anemia secondary to blood loss (chronic): Secondary | ICD-10-CM

## 2017-05-02 LAB — CBC
HEMATOCRIT: 30.6 % — AB (ref 36.0–46.0)
HEMOGLOBIN: 9.7 g/dL — AB (ref 12.0–15.0)
MCHC: 31.7 g/dL (ref 30.0–36.0)
MCV: 82.3 fl (ref 78.0–100.0)
PLATELETS: 234 10*3/uL (ref 150.0–400.0)
RBC: 3.71 Mil/uL — AB (ref 3.87–5.11)
RDW: 16.5 % — AB (ref 11.5–15.5)
WBC: 8.7 10*3/uL (ref 4.0–10.5)

## 2017-05-02 LAB — VITAMIN B12: Vitamin B-12: 375 pg/mL (ref 211–911)

## 2017-05-02 LAB — FERRITIN: FERRITIN: 7.2 ng/mL — AB (ref 10.0–291.0)

## 2017-05-02 MED ORDER — RANITIDINE HCL 300 MG PO TABS: 300.0000 mg | ORAL_TABLET | Freq: Every day | ORAL | 1 refills | Status: DC

## 2017-05-02 NOTE — Patient Instructions (Signed)
We are checking the labs today and will call you back.   

## 2017-05-02 NOTE — Progress Notes (Signed)
   Subjective:    Patient ID: Jaime [name], female    DOB: March 22, 1956, 61 y.o.   MRN: [id number]  HPI The patient is a 61 YO female coming in for epigastric pain. Seen a couple months ago and given protonix. She is taking this faithfully in the morning and is still having recurrent issues. She gets a lot of belching during the day. Some episodes of pain at night time mostly which are 5/10 severity. She uses ginger ale to settle her stomach at these times. Denies blood in stool or dark stools.   Review of Systems  Constitutional: Negative.   Respiratory: Negative for cough, chest tightness and shortness of breath.   Cardiovascular: Negative for chest pain, palpitations and leg swelling.  Gastrointestinal: Positive for abdominal distention and abdominal pain. Negative for blood in stool, constipation, diarrhea, nausea and vomiting.  Musculoskeletal: Negative.   Skin: Negative.   Neurological: Negative.       Objective:   Physical Exam  Constitutional: She is oriented to person, place, and time. She appears well-developed and well-nourished.  HENT:  Head: Normocephalic and atraumatic.  Eyes: EOM are normal.  Neck: Normal range of motion.  Cardiovascular: Normal rate and regular rhythm.  Pulmonary/Chest: Effort normal and breath sounds normal. No respiratory distress. She has no wheezes. She has no rales.  Abdominal: Soft. Bowel sounds are normal. She exhibits no distension. There is tenderness. There is no rebound.  Mild tenderness epigastric, no radiation or guarding  Musculoskeletal: She exhibits no edema.  Neurological: She is alert and oriented to person, place, and time. Coordination normal.  Skin: Skin is warm and dry.  Psychiatric: She has a normal mood and affect.   Vitals:   05/02/17 1027  BP: 130/90  Pulse: 60  Temp: 98.1 F (36.7 C)  TempSrc: Oral  SpO2: 99%  Weight: 195 lb (88.5 kg)  Height: 5\' 6"  (1.676 m)      Assessment & Plan:  Flu shot given at visit.

## 2017-05-03 ENCOUNTER — Telehealth: Payer: Self-pay

## 2017-05-03 NOTE — Telephone Encounter (Signed)
error 

## 2017-05-04 NOTE — Assessment & Plan Note (Signed)
Will add zantac 300 mg qhs to her protonix for better control. If not controlled on this needs to see GI for possible endoscopy. Talked to her about diet triggers and how ginger ale is not a good thing to use for stomach upset. Checking CBC, ferritin, B12 for any signs of anemia.

## 2017-05-10 ENCOUNTER — Encounter: Payer: Self-pay | Admitting: Gastroenterology

## 2017-05-10 ENCOUNTER — Ambulatory Visit (INDEPENDENT_AMBULATORY_CARE_PROVIDER_SITE_OTHER): Payer: 59 | Admitting: Gastroenterology

## 2017-05-10 ENCOUNTER — Other Ambulatory Visit (INDEPENDENT_AMBULATORY_CARE_PROVIDER_SITE_OTHER): Payer: 59

## 2017-05-10 VITALS — BP 130/80 | HR 80 | Ht 66.0 in | Wt 192.8 lb

## 2017-05-10 DIAGNOSIS — D649 Anemia, unspecified: Secondary | ICD-10-CM

## 2017-05-10 DIAGNOSIS — R1013 Epigastric pain: Secondary | ICD-10-CM

## 2017-05-10 DIAGNOSIS — K219 Gastro-esophageal reflux disease without esophagitis: Secondary | ICD-10-CM | POA: Diagnosis not present

## 2017-05-10 DIAGNOSIS — R110 Nausea: Secondary | ICD-10-CM | POA: Diagnosis not present

## 2017-05-10 LAB — IBC PANEL
Iron: 91 ug/dL (ref 42–145)
SATURATION RATIOS: 22.9 % (ref 20.0–50.0)
Transferrin: 284 mg/dL (ref 212.0–360.0)

## 2017-05-10 LAB — FOLATE: Folate: 21.9 ng/mL (ref >5.9)

## 2017-05-10 LAB — IGA: IgA: 224 mg/dL (ref 68–378)

## 2017-05-10 NOTE — Patient Instructions (Signed)
You have been scheduled for an endoscopy. Please follow written instructions given to you at your visit today. If you use inhalers (even only as needed), please bring them with you on the day of your procedure. Your physician has requested that you go to www.startemmi.com and enter the access code given to you at your visit today. This web site gives a general overview about your procedure. However, you should still follow specific instructions given to you by our office regarding your preparation for the procedure.  Your physician has requested that you go to the basement for lab work before leaving today.   

## 2017-05-12 LAB — TISSUE TRANSGLUTAMINASE, IGA: (TTG) AB, IGA: 1 U/mL

## 2017-05-15 ENCOUNTER — Encounter: Payer: Self-pay | Admitting: Internal Medicine

## 2017-05-15 ENCOUNTER — Encounter: Payer: Self-pay | Admitting: Gastroenterology

## 2017-05-15 ENCOUNTER — Other Ambulatory Visit: Payer: Self-pay

## 2017-05-15 ENCOUNTER — Ambulatory Visit (AMBULATORY_SURGERY_CENTER): Payer: 59 | Admitting: Internal Medicine

## 2017-05-15 VITALS — BP 156/78 | HR 63 | Temp 97.1°F | Resp 13 | Ht 66.0 in | Wt 192.0 lb

## 2017-05-15 DIAGNOSIS — K219 Gastro-esophageal reflux disease without esophagitis: Secondary | ICD-10-CM | POA: Insufficient documentation

## 2017-05-15 DIAGNOSIS — K317 Polyp of stomach and duodenum: Secondary | ICD-10-CM

## 2017-05-15 DIAGNOSIS — R1013 Epigastric pain: Secondary | ICD-10-CM

## 2017-05-15 MED ORDER — SODIUM CHLORIDE 0.9 % IV SOLN: 500.0000 mL | INTRAVENOUS | Status: DC

## 2017-05-15 NOTE — Progress Notes (Signed)
Agree with assessment and plans 

## 2017-05-15 NOTE — Op Note (Signed)
[facility] Patient Name: Jaime [name] Procedure Date: 05/15/2017 9:20 AM MRN: [id number] Endoscopist: Docia Chuck. Henrene Pastor , MD Age: 61 Referring MD:  Date of Birth: [birth year] Gender: Female Account #: [id number] Procedure:                Upper GI endoscopy, with biopsies Indications:              Epigastric abdominal pain, Esophageal reflux Medicines:                Monitored Anesthesia Care Procedure:                Pre-Anesthesia Assessment:                           - Prior to the procedure, a History and Physical                            was performed, and patient medications and                            allergies were reviewed. The patient's tolerance of                            previous anesthesia was also reviewed. The risks                            and benefits of the procedure and the sedation                            options and risks were discussed with the patient.                            All questions were answered, and informed consent                            was obtained. Prior Anticoagulants: The patient has                            taken no previous anticoagulant or antiplatelet                            agents. ASA Grade Assessment: II - A patient with                            mild systemic disease. After reviewing the risks                            and benefits, the patient was deemed in                            satisfactory condition to undergo the procedure.                           After obtaining informed consent, the endoscope was  passed under direct vision. Throughout the                            procedure, the patient's blood pressure, pulse, and                            oxygen saturations were monitored continuously. The                            Endoscope was introduced through the mouth, and                            advanced to the second part of duodenum. The upper       GI endoscopy was accomplished without difficulty.                            The patient tolerated the procedure well. Scope In: Scope Out: Findings:                 A mild large-caliber distal esophageal ring was                            found at the gastroesophageal junction.                           The esophagus was otherwise normal.                           Multiple small pedunculated polyps were found in                            the gastric body. These were consistent with benign                            fundic gland type polyps. Biopsies were taken with                            a cold forceps for histology.                           The stomach revealed a sliding hiatal hernia but                            was otherwise normal.                           The examined duodenum was normal.                           The cardia and gastric fundus were normal on                            retroflexion. Complications:            No immediate complications. Estimated Blood Loss:     Estimated blood loss: none. Impression:  1. GERD with hiatal hernia and incidental distal                            esophageal ring                           2. Incidental diminutive gastric polyps. Otherwise                            normal EGD. Recommendation:           1. Reflux precautions with attention to weight loss                            (nurse will give you reflux precautions information                            to review)                           2. Continue current medications                           3. Follow-up biopsies. Dr. Henrene Pastor will send you a                            letter with the results                           4. Resume general medical care with your primary                            care provider. Docia Chuck. Henrene Pastor, MD 05/15/2017 9:46:15 AM This report has been signed electronically.

## 2017-05-15 NOTE — Progress Notes (Signed)
Called to room to assist during endoscopic procedure.  Patient ID and intended procedure confirmed with present staff. Received instructions for my participation in the procedure from the performing physician.  

## 2017-05-15 NOTE — Patient Instructions (Signed)
   Information on Gastric Reflux precautions given to you today   Await pathology  results on biopsies taken today in a letter from Dr Henrene Pastor in 1-3 weeks    YOU HAD AN ENDOSCOPIC PROCEDURE TODAY AT [facility]:   Refer to the procedure report that was given to you for any specific questions about what was found during the examination.  If the procedure report does not answer your questions, please call your gastroenterologist to clarify.  If you requested that your care partner not be given the details of your procedure findings, then the procedure report has been included in a sealed envelope for you to review at your convenience later.  YOU SHOULD EXPECT: Some feelings of bloating in the abdomen. Passage of more gas than usual.  Walking can help get rid of the air that was put into your GI tract during the procedure and reduce the bloating. If you had a lower endoscopy (such as a colonoscopy or flexible sigmoidoscopy) you may notice spotting of blood in your stool or on the toilet paper. If you underwent a bowel prep for your procedure, you may not have a normal bowel movement for a few days.  Please Note:  You might notice some irritation and congestion in your nose or some drainage.  This is from the oxygen used during your procedure.  There is no need for concern and it should clear up in a day or so.  SYMPTOMS TO REPORT IMMEDIATELY:     Following upper endoscopy (EGD)  Vomiting of blood or coffee ground material  New chest pain or pain under the shoulder blades  Painful or persistently difficult swallowing  New shortness of breath  Fever of 100F or higher  Black, tarry-looking stools  For urgent or emergent issues, a gastroenterologist can be reached at any hour by calling [phone].   DIET:  We do recommend a small meal at first, but then you may proceed to your regular diet.  Drink plenty of fluids but you should avoid alcoholic beverages for 24  hours.  ACTIVITY:  You should plan to take it easy for the rest of today and you should NOT DRIVE or use heavy machinery until tomorrow (because of the sedation medicines used during the test).    FOLLOW UP: Our staff will call the number listed on your records the next business day following your procedure to check on you and address any questions or concerns that you may have regarding the information given to you following your procedure. If we do not reach you, we will leave a message.  However, if you are feeling well and you are not experiencing any problems, there is no need to return our call.  We will assume that you have returned to your regular daily activities without incident.  If any biopsies were taken you will be contacted by phone or by letter within the next 1-3 weeks.  Please call us at [phone] if you have not heard about the biopsies in 3 weeks.    SIGNATURES/CONFIDENTIALITY: You and/or your care partner have signed paperwork which will be entered into your electronic medical record.  These signatures attest to the fact that that the information above on your After Visit Summary has been reviewed and is understood.  Full responsibility of the confidentiality of this discharge information lies with you and/or your care-partner.

## 2017-05-15 NOTE — Progress Notes (Signed)
05/15/2017 Jaime [name] [id number] Oct 08, 1955   HISTORY OF PRESENT ILLNESS:  This is a pleasant 61 year old female who is previously known. To Dr. Deatra Ina.  Had a colonoscopy in 03/2013 that showed only moderate diverticulosis in the sigmoid colon.  She is here today at the request of her PCP, Dr. Sharlet Salina, for evaluation of anemia.  She has been anemic with Hgb in the 9.5-10.5 gram range for the better part of 5 years from what I can tell.  This is normocytic and has been all along.  Ferritin is low at 7.2 but no other iron studies have been checked.  Vitamin B12 is normal.  She denies any sign of GI bleeding including black or bloody stools.  Stools have not been hemocculted.  Occasional constipation but that has been present for years.  She does report issues with intermittent sharp epigastric abdominal pain since July.  Also has some nausea and a lot of reflux.  Had vomiting on only one occasion.  She is on pantoprazole 40 mg daily.  Denies NSAID use.  Has never undergone EGD in the past.   Past Medical History:  Diagnosis Date  . Endometriosis   . GERD (gastroesophageal reflux disease)   . Hypertension   . Meningioma ([place])   . Migraine   . Nontoxic multinodular goiter   . 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 Peptic ulcer disease   . PVD (peripheral vascular disease) ([place])    Past Surgical History:  Procedure Laterality Date  . CT RADIATION THERAPY GUIDE     CT done yearly for menigioma with radiation if needed  . CYST EXCISION     head  . CYST EXCISION Left    x 2   . cyst removed     left wrist  . ORIF ANKLE FRACTURE     right  . OVARIAN CYST REMOVAL     right  . TONSILLECTOMY  1965  . TRIGGER FINGER RELEASE  2012   right thumb  . TUBAL LIGATION    . VIDEO BRONCHOSCOPY  11/10/2011   Procedure: VIDEO BRONCHOSCOPY WITH FLUORO;  Surgeon: Tanda Rockers, MD;  Location: [facility];  Service: Cardiopulmonary;   Laterality: Bilateral;    reports that she quit smoking about 35 years ago. Her smoking use included cigarettes. She has a 0.90 pack-year smoking history. she has never used smokeless tobacco. She reports that she does not drink alcohol or use drugs. family history includes Colon cancer in her maternal grandmother; Dementia in her mother; Diabetes in her mother; Esophageal cancer in her father; Heart disease in her father and mother; Heart failure in her mother; Hypertension in her mother; Lung cancer in her mother. No Known Allergies    Outpatient Encounter Medications as of 05/10/2017  Medication Sig  . aspirin 325 MG tablet Take 81 mg by mouth daily.   Marland Kitchen atenolol (TENORMIN) 100 MG tablet TAKE 1 TABLET BY MOUTH EVERY DAY  . buPROPion (WELLBUTRIN XL) 150 MG 24 hr tablet TAKE 1 TABLET BY MOUTH EVERY DAY  . gabapentin (NEURONTIN) 100 MG capsule Take 1 capsule (100 mg total) by mouth 3 (three) times daily.  . Multiple Vitamins-Minerals (MULTIVITAMINS THER. W/MINERALS) TABS Take 1 tablet by mouth as needed.   . pantoprazole (PROTONIX) 40 MG tablet TAKE 1 TABLET BY MOUTH EVERY MORNING  . ranitidine (ZANTAC) 300 MG tablet Take 1 tablet (300 mg total) at bedtime by mouth. (Patient  not taking: Reported on 05/15/2017)  . zolpidem (AMBIEN) 10 MG tablet Take 1 tablet (10 mg total) by mouth at bedtime as needed for sleep. (Patient not taking: Reported on 05/15/2017)   No facility-administered encounter medications on file as of 05/10/2017.      REVIEW OF SYSTEMS  : All other systems reviewed and negative except where noted in the History of Present Illness.   PHYSICAL EXAM: BP 130/80   Pulse 80   Ht 5\' 6"  (1.676 m)   Wt 192 lb 12.8 oz (87.5 kg)   BMI 31.12 kg/m  General: Well developed black female in no acute distress Head: Normocephalic and atraumatic Eyes:  Sclerae anicteric, conjunctiva pink. Ears: Normal auditory acuity Lungs: Clear throughout to auscultation; no increased WOB. Heart:  Regular rate and rhythm; no M/R/G. Abdomen: Soft, non-distended.  BS present.  Non-tender. Musculoskeletal: Symmetrical with no gross deformities  Skin: No lesions on visible extremities Extremities: No edema  Neurological: Alert oriented x 4, grossly non-focal Psychological:  Alert and cooperative. Normal mood and affect  ASSESSMENT AND PLAN: *Anemia:  Normocytic.  Long-standing, Hgb stable for the past 5 years.  I am not convinced that this is iron deficiency.  Ferritin is low but other iron studies have not been ordered.  Will check remaining iron studies as well as folate and celiac labs.  Colonoscopy normal 4 years ago.  No overt GI bleeding. *Intermittent epigastric abdominal pain and nausea with GERD:  Is on pantoprazole 40 mg daily.  Will continue this for now.    **Given her UGI symptoms and the anemia, we will proceed with EGD for now.  Will schedule with Dr. Henrene Pastor.  I do not think that repeat colonoscopy is warranted at this time.  The risks, benefits, and alternatives to EGD were discussed with the patient and she consents to proceed.   CC:  Hoyt Koch, *

## 2017-05-15 NOTE — Progress Notes (Signed)
Pt. Reports no change in her medical or surgical history since her pre-visit 05/10/2017.

## 2017-05-15 NOTE — Progress Notes (Signed)
Report to PACU, RN, vss, BBS= Clear.  

## 2017-05-16 ENCOUNTER — Telehealth: Payer: Self-pay

## 2017-05-16 NOTE — Telephone Encounter (Signed)
  Follow up Call-  Call back number 05/15/2017  Post procedure Call Back phone  # [phone]  Permission to leave phone message Yes  Some recent data might be hidden     Patient questions:  Do you have a fever, pain , or abdominal swelling? No. Pain Score  0 *  Have you tolerated food without any problems? No.  Have you been able to return to your normal activities? Yes.    Do you have any questions about your discharge instructions: Diet   No. Medications  No. Follow up visit  No.  Do you have questions or concerns about your Care? No.  Actions: * If pain score is 4 or above: No action needed, pain <4.

## 2017-05-19 ENCOUNTER — Encounter: Payer: Self-pay | Admitting: Internal Medicine

## 2017-06-01 ENCOUNTER — Other Ambulatory Visit: Payer: Self-pay | Admitting: Internal Medicine

## 2017-07-24 ENCOUNTER — Other Ambulatory Visit: Payer: Self-pay | Admitting: Internal Medicine

## 2017-09-24 ENCOUNTER — Other Ambulatory Visit: Payer: Self-pay | Admitting: Internal Medicine

## 2017-10-17 ENCOUNTER — Other Ambulatory Visit (INDEPENDENT_AMBULATORY_CARE_PROVIDER_SITE_OTHER): Payer: 59

## 2017-10-17 ENCOUNTER — Encounter: Payer: Self-pay | Admitting: Internal Medicine

## 2017-10-17 ENCOUNTER — Ambulatory Visit (INDEPENDENT_AMBULATORY_CARE_PROVIDER_SITE_OTHER): Payer: 59 | Admitting: Internal Medicine

## 2017-10-17 VITALS — BP 130/88 | HR 68 | Temp 99.4°F | Ht 66.0 in | Wt 199.0 lb

## 2017-10-17 DIAGNOSIS — E042 Nontoxic multinodular goiter: Secondary | ICD-10-CM | POA: Diagnosis not present

## 2017-10-17 DIAGNOSIS — Z0000 Encounter for general adult medical examination without abnormal findings: Secondary | ICD-10-CM

## 2017-10-17 DIAGNOSIS — F325 Major depressive disorder, single episode, in full remission: Secondary | ICD-10-CM

## 2017-10-17 DIAGNOSIS — R7301 Impaired fasting glucose: Secondary | ICD-10-CM

## 2017-10-17 DIAGNOSIS — K219 Gastro-esophageal reflux disease without esophagitis: Secondary | ICD-10-CM

## 2017-10-17 DIAGNOSIS — I10 Essential (primary) hypertension: Secondary | ICD-10-CM | POA: Diagnosis not present

## 2017-10-17 LAB — CBC
HCT: 32.9 % — ABNORMAL LOW (ref 36.0–46.0)
HEMOGLOBIN: 10.8 g/dL — AB (ref 12.0–15.0)
MCHC: 32.9 g/dL (ref 30.0–36.0)
MCV: 82.5 fl (ref 78.0–100.0)
PLATELETS: 245 10*3/uL (ref 150.0–400.0)
RBC: 3.99 Mil/uL (ref 3.87–5.11)
RDW: 16.9 % — ABNORMAL HIGH (ref 11.5–15.5)
WBC: 9.1 10*3/uL (ref 4.0–10.5)

## 2017-10-17 LAB — COMPREHENSIVE METABOLIC PANEL
ALBUMIN: 3.8 g/dL (ref 3.5–5.2)
ALK PHOS: 86 U/L (ref 39–117)
ALT: 8 U/L (ref 0–35)
AST: 15 U/L (ref 0–37)
BILIRUBIN TOTAL: 0.4 mg/dL (ref 0.2–1.2)
BUN: 13 mg/dL (ref 6–23)
CALCIUM: 8.7 mg/dL (ref 8.4–10.5)
CO2: 29 mEq/L (ref 19–32)
Chloride: 104 mEq/L (ref 96–112)
Creatinine, Ser: 0.84 mg/dL (ref 0.40–1.20)
GFR: 88.61 mL/min (ref >60.00)
Glucose, Bld: 103 mg/dL — ABNORMAL HIGH (ref 70–99)
Potassium: 4 mEq/L (ref 3.5–5.1)
Sodium: 140 mEq/L (ref 135–145)
TOTAL PROTEIN: 6.7 g/dL (ref 6.0–8.3)

## 2017-10-17 LAB — T4, FREE: FREE T4: 0.55 ng/dL — AB (ref 0.60–1.60)

## 2017-10-17 LAB — LIPID PANEL
CHOL/HDL RATIO: 4
CHOLESTEROL: 156 mg/dL (ref 0–200)
HDL: 40 mg/dL (ref >39.00)
LDL Cholesterol: 100 mg/dL — ABNORMAL HIGH (ref 0–99)
NonHDL: 115.61
TRIGLYCERIDES: 76 mg/dL (ref 0.0–149.0)
VLDL: 15.2 mg/dL (ref 0.0–40.0)

## 2017-10-17 LAB — HEMOGLOBIN A1C: Hgb A1c MFr Bld: 6.9 % — ABNORMAL HIGH (ref 4.6–6.5)

## 2017-10-17 LAB — TSH: TSH: 1.64 u[IU]/mL (ref 0.35–4.50)

## 2017-10-17 MED ORDER — ATENOLOL 100 MG PO TABS: 100.0000 mg | ORAL_TABLET | Freq: Every day | ORAL | 3 refills | Status: DC

## 2017-10-17 MED ORDER — GABAPENTIN 100 MG PO CAPS: 100.0000 mg | ORAL_CAPSULE | Freq: Three times a day (TID) | ORAL | 3 refills | Status: DC

## 2017-10-17 NOTE — Assessment & Plan Note (Signed)
Flu and tetanus up to date. Needs pap smear with gyn. Mammogram ordered. Added to shingrix waiting list. Counseled about sun safety and mole surveillance. Given 10 year screening recommendations.

## 2017-10-17 NOTE — Assessment & Plan Note (Signed)
Checking HgA1c and adjust as needed.  

## 2017-10-17 NOTE — Assessment & Plan Note (Signed)
Taking wellbutrin and overall well controlled. Not exercising and no counseling.

## 2017-10-17 NOTE — Assessment & Plan Note (Signed)
Checking TSH and free T4.

## 2017-10-17 NOTE — Assessment & Plan Note (Signed)
Taking protonix and zantac with good control. Recent EGD without worrisome findings.

## 2017-10-17 NOTE — Patient Instructions (Signed)
We will order the mammogram to get done at the breast center. Call them at [phone].   Get the pap smear done with the gynecologist.   We have sent in the refills today and are checking the labs.   Health Maintenance, Female Adopting a healthy lifestyle and getting preventive care can go a long way to promote health and wellness. Talk with your health care provider about what schedule of regular examinations is right for you. This is a good chance for you to check in with your provider about disease prevention and staying healthy. In between checkups, there are plenty of things you can do on your own. Experts have done a lot of research about which lifestyle changes and preventive measures are most likely to keep you healthy. Ask your health care provider for more information. Weight and diet Eat a healthy diet  Be sure to include plenty of vegetables, fruits, low-fat dairy products, and lean protein.  Do not eat a lot of foods high in solid fats, added sugars, or salt.  Get regular exercise. This is one of the most important things you can do for your health. ? Most adults should exercise for at least 150 minutes each week. The exercise should increase your heart rate and make you sweat (moderate-intensity exercise). ? Most adults should also do strengthening exercises at least twice a week. This is in addition to the moderate-intensity exercise.  Maintain a healthy weight  Body mass index (BMI) is a measurement that can be used to identify possible weight problems. It estimates body fat based on height and weight. Your health care provider can help determine your BMI and help you achieve or maintain a healthy weight.  For females 61 years of age and older: ? A BMI below 18.5 is considered underweight. ? A BMI of 18.5 to 24.9 is normal. ? A BMI of 25 to 29.9 is considered overweight. ? A BMI of 30 and above is considered obese.  Watch levels of cholesterol and blood lipids  You  should start having your blood tested for lipids and cholesterol at 61 years of age, then have this test every 5 years.  You may need to have your cholesterol levels checked more often if: ? Your lipid or cholesterol levels are high. ? You are older than 61 years of age. ? You are at high risk for heart disease.  Cancer screening Lung Cancer  Lung cancer screening is recommended for adults 61-46 years old who are at high risk for lung cancer because of a history of smoking.  A yearly low-dose CT scan of the lungs is recommended for people who: ? Currently smoke. ? Have quit within the past 15 years. ? Have at least a 30-pack-year history of smoking. A pack year is smoking an average of one pack of cigarettes a day for 1 year.  Yearly screening should continue until it has been 15 years since you quit.  Yearly screening should stop if you develop a health problem that would prevent you from having lung cancer treatment.  Breast Cancer  Practice breast self-awareness. This means understanding how your breasts normally appear and feel.  It also means doing regular breast self-exams. Let your health care provider know about any changes, no matter how small.  If you are in your 20s or 30s, you should have a clinical breast exam (CBE) by a health care provider every 1-3 years as part of a regular health exam.  If you are 40  or older, have a CBE every year. Also consider having a breast X-ray (mammogram) every year.  If you have a family history of breast cancer, talk to your health care provider about genetic screening.  If you are at high risk for breast cancer, talk to your health care provider about having an MRI and a mammogram every year.  Breast cancer gene (BRCA) assessment is recommended for women who have family members with BRCA-related cancers. BRCA-related cancers include: ? Breast. ? Ovarian. ? Tubal. ? Peritoneal cancers.  Results of the assessment will determine the  need for genetic counseling and BRCA1 and BRCA2 testing.  Cervical Cancer Your health care provider may recommend that you be screened regularly for cancer of the pelvic organs (ovaries, uterus, and vagina). This screening involves a pelvic examination, including checking for microscopic changes to the surface of your cervix (Pap test). You may be encouraged to have this screening done every 3 years, beginning at age 22.  For women ages 38-65, health care providers may recommend pelvic exams and Pap testing every 3 years, or they may recommend the Pap and pelvic exam, combined with testing for human papilloma virus (HPV), every 5 years. Some types of HPV increase your risk of cervical cancer. Testing for HPV may also be done on women of any age with unclear Pap test results.  Other health care providers may not recommend any screening for nonpregnant women who are considered low risk for pelvic cancer and who do not have symptoms. Ask your health care provider if a screening pelvic exam is right for you.  If you have had past treatment for cervical cancer or a condition that could lead to cancer, you need Pap tests and screening for cancer for at least 20 years after your treatment. If Pap tests have been discontinued, your risk factors (such as having a new sexual partner) need to be reassessed to determine if screening should resume. Some women have medical problems that increase the chance of getting cervical cancer. In these cases, your health care provider may recommend more frequent screening and Pap tests.  Colorectal Cancer  This type of cancer can be detected and often prevented.  Routine colorectal cancer screening usually begins at 61 years of age and continues through 61 years of age.  Your health care provider may recommend screening at an earlier age if you have risk factors for colon cancer.  Your health care provider may also recommend using home test kits to check for hidden blood  in the stool.  A small camera at the end of a tube can be used to examine your colon directly (sigmoidoscopy or colonoscopy). This is done to check for the earliest forms of colorectal cancer.  Routine screening usually begins at age 63.  Direct examination of the colon should be repeated every 5-10 years through 61 years of age. However, you may need to be screened more often if early forms of precancerous polyps or small growths are found.  Skin Cancer  Check your skin from head to toe regularly.  Tell your health care provider about any new moles or changes in moles, especially if there is a change in a mole's shape or color.  Also tell your health care provider if you have a mole that is larger than the size of a pencil eraser.  Always use sunscreen. Apply sunscreen liberally and repeatedly throughout the day.  Protect yourself by wearing long sleeves, pants, a wide-brimmed hat, and sunglasses whenever you are  outside.  Heart disease, diabetes, and high blood pressure  High blood pressure causes heart disease and increases the risk of stroke. High blood pressure is more likely to develop in: ? People who have blood pressure in the high end of the normal range (130-139/85-89 mm Hg). ? People who are overweight or obese. ? People who are African American.  If you are 18-39 years of age, have your blood pressure checked every 3-5 years. If you are 40 years of age or older, have your blood pressure checked every year. You should have your blood pressure measured twice-once when you are at a hospital or clinic, and once when you are not at a hospital or clinic. Record the average of the two measurements. To check your blood pressure when you are not at a hospital or clinic, you can use: ? An automated blood pressure machine at a pharmacy. ? A home blood pressure monitor.  If you are between 55 years and 79 years old, ask your health care provider if you should take aspirin to prevent  strokes.  Have regular diabetes screenings. This involves taking a blood sample to check your fasting blood sugar level. ? If you are at a normal weight and have a low risk for diabetes, have this test once every three years after 61 years of age. ? If you are overweight and have a high risk for diabetes, consider being tested at a younger age or more often. Preventing infection Hepatitis B  If you have a higher risk for hepatitis B, you should be screened for this virus. You are considered at high risk for hepatitis B if: ? You were born in a country where hepatitis B is common. Ask your health care provider which countries are considered high risk. ? Your parents were born in a high-risk country, and you have not been immunized against hepatitis B (hepatitis B vaccine). ? You have HIV or AIDS. ? You use needles to inject street drugs. ? You live with someone who has hepatitis B. ? You have had sex with someone who has hepatitis B. ? You get hemodialysis treatment. ? You take certain medicines for conditions, including cancer, organ transplantation, and autoimmune conditions.  Hepatitis C  Blood testing is recommended for: ? Everyone born from 1945 through 1965. ? Anyone with known risk factors for hepatitis C.  Sexually transmitted infections (STIs)  You should be screened for sexually transmitted infections (STIs) including gonorrhea and chlamydia if: ? You are sexually active and are younger than 61 years of age. ? You are older than 61 years of age and your health care provider tells you that you are at risk for this type of infection. ? Your sexual activity has changed since you were last screened and you are at an increased risk for chlamydia or gonorrhea. Ask your health care provider if you are at risk.  If you do not have HIV, but are at risk, it may be recommended that you take a prescription medicine daily to prevent HIV infection. This is called pre-exposure prophylaxis  (PrEP). You are considered at risk if: ? You are sexually active and do not regularly use condoms or know the HIV status of your partner(s). ? You take drugs by injection. ? You are sexually active with a partner who has HIV.  Talk with your health care provider about whether you are at high risk of being infected with HIV. If you choose to begin PrEP, you should first be tested   for HIV. You should then be tested every 3 months for as long as you are taking PrEP. Pregnancy  If you are premenopausal and you may become pregnant, ask your health care provider about preconception counseling.  If you may become pregnant, take 400 to 800 micrograms (mcg) of folic acid every day.  If you want to prevent pregnancy, talk to your health care provider about birth control (contraception). Osteoporosis and menopause  Osteoporosis is a disease in which the bones lose minerals and strength with aging. This can result in serious bone fractures. Your risk for osteoporosis can be identified using a bone density scan.  If you are 7 years of age or older, or if you are at risk for osteoporosis and fractures, ask your health care provider if you should be screened.  Ask your health care provider whether you should take a calcium or vitamin D supplement to lower your risk for osteoporosis.  Menopause may have certain physical symptoms and risks.  Hormone replacement therapy may reduce some of these symptoms and risks. Talk to your health care provider about whether hormone replacement therapy is right for you. Follow these instructions at home:  Schedule regular health, dental, and eye exams.  Stay current with your immunizations.  Do not use any tobacco products including cigarettes, chewing tobacco, or electronic cigarettes.  If you are pregnant, do not drink alcohol.  If you are breastfeeding, limit how much and how often you drink alcohol.  Limit alcohol intake to no more than 1 drink per day for  nonpregnant women. One drink equals 12 ounces of beer, 5 ounces of wine, or 1 ounces of hard liquor.  Do not use street drugs.  Do not share needles.  Ask your health care provider for help if you need support or information about quitting drugs.  Tell your health care provider if you often feel depressed.  Tell your health care provider if you have ever been abused or do not feel safe at home. This information is not intended to replace advice given to you by your health care provider. Make sure you discuss any questions you have with your health care provider. Document Released: 12/20/2010 Document Revised: 11/12/2015 Document Reviewed: 03/10/2015 Elsevier Interactive Patient Education  [employer].

## 2017-10-17 NOTE — Progress Notes (Signed)
   Subjective:    Patient ID: Jaime [name], female    DOB: 07-12-1955, 62 y.o.   MRN: [id number]  HPI The patient is a 62 YO female coming in for physical.   PMH, [facility], social history reviewed and updated.   Review of Systems  Constitutional: Negative.   HENT: Negative.   Eyes: Negative.   Respiratory: Negative for cough, chest tightness and shortness of breath.   Cardiovascular: Negative for chest pain, palpitations and leg swelling.  Gastrointestinal: Negative for abdominal distention, abdominal pain, constipation, diarrhea, nausea and vomiting.  Musculoskeletal: Negative.   Skin: Negative.   Neurological: Negative.   Psychiatric/Behavioral: Negative.       Objective:   Physical Exam  Constitutional: She is oriented to person, place, and time. She appears well-developed and well-nourished.  HENT:  Head: Normocephalic and atraumatic.  Eyes: EOM are normal.  Neck: Normal range of motion.  Cardiovascular: Normal rate and regular rhythm.  Pulmonary/Chest: Effort normal and breath sounds normal. No respiratory distress. She has no wheezes. She has no rales.  Abdominal: Soft. Bowel sounds are normal. She exhibits no distension. There is no tenderness. There is no rebound.  Musculoskeletal: She exhibits no edema.  Neurological: She is alert and oriented to person, place, and time. Coordination normal.  Skin: Skin is warm and dry.  Psychiatric: She has a normal mood and affect.   Vitals:   10/17/17 0859  BP: 130/88  Pulse: 68  Temp: 99.4 F (37.4 C)  TempSrc: Oral  SpO2: 96%  Weight: 199 lb (90.3 kg)  Height: 5\' 6"  (1.676 m)      Assessment & Plan:

## 2017-10-17 NOTE — Assessment & Plan Note (Signed)
Taking atenolol and BP at goal. Checking CMP and adjust as needed.

## 2017-10-18 ENCOUNTER — Other Ambulatory Visit: Payer: Self-pay | Admitting: Internal Medicine

## 2017-10-18 DIAGNOSIS — E039 Hypothyroidism, unspecified: Secondary | ICD-10-CM

## 2017-10-18 MED ORDER — LEVOTHYROXINE SODIUM 50 MCG PO TABS: 50.0000 ug | ORAL_TABLET | Freq: Every day | ORAL | 1 refills | Status: DC

## 2017-10-21 ENCOUNTER — Other Ambulatory Visit: Payer: Self-pay

## 2017-10-21 ENCOUNTER — Emergency Department (HOSPITAL_COMMUNITY)
Admission: EM | Admit: 2017-10-21 | Discharge: 2017-10-22 | Disposition: A | Discharge status: Home / Self Care | Payer: 59 | Attending: Emergency Medicine | Admitting: Emergency Medicine

## 2017-10-21 ENCOUNTER — Encounter (HOSPITAL_COMMUNITY): Payer: Self-pay

## 2017-10-21 DIAGNOSIS — I10 Essential (primary) hypertension: Secondary | ICD-10-CM | POA: Diagnosis not present

## 2017-10-21 DIAGNOSIS — Z87891 Personal history of nicotine dependence: Secondary | ICD-10-CM | POA: Diagnosis not present

## 2017-10-21 DIAGNOSIS — M5441 Lumbago with sciatica, right side: Secondary | ICD-10-CM

## 2017-10-21 DIAGNOSIS — Z79899 Other long term (current) drug therapy: Secondary | ICD-10-CM | POA: Diagnosis not present

## 2017-10-21 DIAGNOSIS — M5442 Lumbago with sciatica, left side: Secondary | ICD-10-CM | POA: Diagnosis not present

## 2017-10-21 DIAGNOSIS — Z7982 Long term (current) use of aspirin: Secondary | ICD-10-CM | POA: Insufficient documentation

## 2017-10-21 DIAGNOSIS — M545 Low back pain: Secondary | ICD-10-CM | POA: Diagnosis not present

## 2017-10-21 MED ORDER — HYDROCODONE-ACETAMINOPHEN 5-325 MG PO TABS
1.0000 | ORAL_TABLET | Freq: Once | ORAL | Status: AC
  Administered 2017-10-22: 1 via ORAL
  Filled 2017-10-21: qty 1

## 2017-10-21 MED ORDER — NAPROXEN 500 MG PO TABS
500.0000 mg | ORAL_TABLET | Freq: Once | ORAL | Status: AC
  Administered 2017-10-22: 500 mg via ORAL
  Filled 2017-10-21: qty 1

## 2017-10-21 NOTE — ED Triage Notes (Signed)
Pt reports lower back pain that radiates down through to her thighs. Hx of sciatica. A&Ox4.

## 2017-10-21 NOTE — ED Notes (Signed)
Bed: WA07 Expected date:  Expected time:  Means of arrival:  Comments: 

## 2017-10-21 NOTE — ED Provider Notes (Signed)
[facility] DEPT Provider Note: Jaime Spurling, MD, FACEP  CSN: [id number] MRN: [id number] ARRIVAL: 10/21/17 at 2047 ROOM: WA07/WA07   CHIEF COMPLAINT  Back Pain   HISTORY OF PRESENT ILLNESS  10/21/17 11:44 PM Jaime [name] is a 62 y.o. female with a history of sciatica.  She has not had an episode in several years.  She is here with pain in her lower back that began yesterday after bending over.  She describes the pain as being bilateral lower lumbar radiating to her thighs bilaterally.  She is having some tingling in her thighs but no weakness.  She has had no bowel or bladder changes.  Pain is worse with movement of her lower back.  She rates her pain as a 6 out of 10 at its worst.  She has only taken aspirin for the pain without adequate relief.  Consultation with the [facility] state controlled substances database reveals the patient has received 2 opioid prescriptions in the past 2 years, most recently March 2018.   Past Medical History:  Diagnosis Date  . Endometriosis   . GERD (gastroesophageal reflux disease)   . Hypertension   . Meningioma ([place])   . Migraine   . Nontoxic multinodular goiter   . 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 Peptic ulcer disease   . PVD (peripheral vascular disease) ([place])     Past Surgical History:  Procedure Laterality Date  . CT RADIATION THERAPY GUIDE     CT done yearly for menigioma with radiation if needed  . CYST EXCISION     head  . CYST EXCISION Left    x 2   . cyst removed     left wrist  . ORIF ANKLE FRACTURE     right  . OVARIAN CYST REMOVAL     right  . TONSILLECTOMY  1965  . TRIGGER FINGER RELEASE  2012   right thumb  . TUBAL LIGATION    . VIDEO BRONCHOSCOPY  11/10/2011   Procedure: VIDEO BRONCHOSCOPY WITH FLUORO;  Surgeon: Tanda Rockers, MD;  Location: [facility];  Service: Cardiopulmonary;  Laterality: Bilateral;    Family History  Problem Relation Age of  Onset  . Hypertension Mother   . Diabetes Mother   . Heart disease Mother   . Heart failure Mother   . Dementia Mother   . Lung cancer Mother   . Esophageal cancer Father        was a smoker  . Heart disease Father   . Colon cancer Maternal Grandmother   . Breast cancer Neg Hx   . Coronary artery disease Neg Hx     Social History   Tobacco Use  . Smoking status: Former Smoker    Packs/day: 0.30    Years: 3.00    Pack years: 0.90    Types: Cigarettes    Last attempt to quit: 06/20/1981    Years since quitting: 36.3  . Smokeless tobacco: Never Used  Substance Use Topics  . Alcohol use: No  . Drug use: No    Prior to Admission medications   Medication Sig Start Date End Date Taking? Authorizing Provider  aspirin 325 MG tablet Take 81 mg by mouth daily.     [provider]  atenolol (TENORMIN) 100 MG tablet Take 1 tablet (100 mg total) by mouth daily. 10/17/17   Hoyt Koch, MD  buPROPion (WELLBUTRIN XL) 150 MG 24 hr tablet TAKE 1  TABLET BY MOUTH EVERY DAY Patient not taking: Reported on 10/17/2017 01/04/16   Hoyt Koch, MD  gabapentin (NEURONTIN) 100 MG capsule Take 1 capsule (100 mg total) by mouth 3 (three) times daily. 10/17/17   Hoyt Koch, MD  levothyroxine (SYNTHROID, LEVOTHROID) 50 MCG tablet Take 1 tablet (50 mcg total) by mouth daily. 10/18/17   Hoyt Koch, MD  Multiple Vitamins-Minerals (MULTIVITAMINS THER. W/MINERALS) TABS Take 1 tablet by mouth as needed.     [provider]  pantoprazole (PROTONIX) 40 MG tablet TAKE 1 TABLET BY MOUTH EVERY MORNING 04/12/17   Hoyt Koch, MD  ranitidine (ZANTAC) 300 MG tablet Take 1 tablet (300 mg total) at bedtime by mouth. 05/02/17   Hoyt Koch, MD  enalapril (VASOTEC) 10 MG tablet For blood pressure  06/22/11 08/31/11  Norins, Heinz Knuckles, MD  ferrous gluconate (FERGON) 325 MG tablet Take 325 mg by mouth daily.    08/31/11  [provider]     Allergies Patient has no known allergies.   REVIEW OF SYSTEMS  Negative except as noted here or in the History of Present Illness.   PHYSICAL EXAMINATION  Initial Vital Signs Blood pressure 123/78, pulse 63, temperature 98.5 F (36.9 C), temperature source Oral, resp. rate 18, height 5\' 6"  (1.676 m), weight 90.8 kg (200 lb 2 oz), SpO2 98 %.  Examination General: Well-developed, well-nourished female in no acute distress; appearance consistent with age of record HENT: normocephalic; atraumatic Eyes: pupils equal, round and reactive to light; extraocular muscles intact Neck: supple Heart: regular rate and rhythm Lungs: clear to auscultation bilaterally Abdomen: soft; nondistended; nontender; bowel sounds present Back: Bilateral lower lumbar tenderness; negative straight leg raise on the right, positive straight leg raise on the left at 45 degrees Extremities: No deformity; full range of motion; pulses normal Neurologic: Awake, alert and oriented; motor function intact in all extremities and symmetric; no facial droop Skin: Warm and dry Psychiatric: Normal mood and affect   RESULTS  Summary of this visit's results, reviewed by myself:   EKG Interpretation  Date/Time:    Ventricular Rate:    PR Interval:    QRS Duration:   QT Interval:    QTC Calculation:   R Axis:     Text Interpretation:        Laboratory Studies: No results found for this or any previous visit (from the past 24 hour(s)). Imaging Studies: No results found.  ED COURSE and MDM  Nursing notes and initial vitals signs, including pulse oximetry, reviewed.  Vitals:   10/21/17 2054 10/21/17 2056  BP: 123/78   Pulse: 63   Resp: 18   Temp: 98.5 F (36.9 C)   TempSrc: Oral   SpO2: 98%   Weight:  90.8 kg (200 lb 2 oz)  Height:  5\' 6"  (1.676 m)   History and exam consistent with sciatica.  She has been seen at [facility] and will follow up there for definitive treatment.  There is no  evidence of cauda equina syndrome at the present time.  PROCEDURES    ED DIAGNOSES     ICD-10-CM   1. Acute bilateral low back pain with bilateral sciatica M54.42    M54.41        Jaime [name], Jaime Reichmann, MD 10/21/17 2357

## 2017-10-22 DIAGNOSIS — M5442 Lumbago with sciatica, left side: Secondary | ICD-10-CM | POA: Diagnosis not present

## 2017-10-22 MED ORDER — HYDROCODONE-ACETAMINOPHEN 5-325 MG PO TABS: 1.0000 | ORAL_TABLET | Freq: Four times a day (QID) | ORAL | 0 refills | Status: DC | PRN

## 2017-10-22 MED ORDER — NAPROXEN 375 MG PO TABS: ORAL_TABLET | ORAL | 0 refills | Status: DC

## 2017-11-07 ENCOUNTER — Ambulatory Visit
Admission: RE | Admit: 2017-11-07 | Discharge: 2017-11-07 | Disposition: A | Discharge status: Home / Self Care | Payer: 59 | Source: Ambulatory Visit | Attending: Internal Medicine | Admitting: Internal Medicine

## 2017-11-07 DIAGNOSIS — Z0000 Encounter for general adult medical examination without abnormal findings: Secondary | ICD-10-CM

## 2017-11-07 DIAGNOSIS — Z1231 Encounter for screening mammogram for malignant neoplasm of breast: Secondary | ICD-10-CM | POA: Diagnosis not present

## 2017-11-23 ENCOUNTER — Ambulatory Visit (INDEPENDENT_AMBULATORY_CARE_PROVIDER_SITE_OTHER): Payer: 59 | Admitting: *Deleted

## 2017-11-23 DIAGNOSIS — Z23 Encounter for immunization: Secondary | ICD-10-CM | POA: Diagnosis not present

## 2017-11-28 ENCOUNTER — Other Ambulatory Visit (INDEPENDENT_AMBULATORY_CARE_PROVIDER_SITE_OTHER): Payer: 59

## 2017-11-28 DIAGNOSIS — E039 Hypothyroidism, unspecified: Secondary | ICD-10-CM

## 2017-11-28 LAB — TSH: TSH: 0.51 u[IU]/mL (ref 0.35–4.50)

## 2017-11-28 LAB — T4, FREE: Free T4: 0.7 ng/dL (ref 0.60–1.60)

## 2018-01-25 ENCOUNTER — Ambulatory Visit (INDEPENDENT_AMBULATORY_CARE_PROVIDER_SITE_OTHER): Payer: 59 | Admitting: *Deleted

## 2018-01-25 DIAGNOSIS — Z23 Encounter for immunization: Secondary | ICD-10-CM

## 2018-03-19 DIAGNOSIS — M79642 Pain in left hand: Secondary | ICD-10-CM | POA: Diagnosis not present

## 2018-03-19 DIAGNOSIS — M79641 Pain in right hand: Secondary | ICD-10-CM | POA: Diagnosis not present

## 2018-03-19 DIAGNOSIS — M7711 Lateral epicondylitis, right elbow: Secondary | ICD-10-CM | POA: Diagnosis not present

## 2018-03-22 ENCOUNTER — Other Ambulatory Visit: Payer: Self-pay | Admitting: Internal Medicine

## 2018-04-23 DIAGNOSIS — M7711 Lateral epicondylitis, right elbow: Secondary | ICD-10-CM | POA: Diagnosis not present

## 2018-06-11 DIAGNOSIS — G463 Brain stem stroke syndrome: Secondary | ICD-10-CM | POA: Diagnosis not present

## 2018-06-11 DIAGNOSIS — H5021 Vertical strabismus, right eye: Secondary | ICD-10-CM | POA: Diagnosis not present

## 2018-06-11 DIAGNOSIS — H25813 Combined forms of age-related cataract, bilateral: Secondary | ICD-10-CM | POA: Diagnosis not present

## 2018-06-11 DIAGNOSIS — H35372 Puckering of macula, left eye: Secondary | ICD-10-CM | POA: Diagnosis not present

## 2018-06-12 ENCOUNTER — Encounter: Payer: Self-pay | Admitting: Internal Medicine

## 2018-06-12 ENCOUNTER — Ambulatory Visit (INDEPENDENT_AMBULATORY_CARE_PROVIDER_SITE_OTHER): Payer: 59 | Admitting: Internal Medicine

## 2018-06-12 DIAGNOSIS — D320 Benign neoplasm of cerebral meninges: Secondary | ICD-10-CM | POA: Diagnosis not present

## 2018-06-12 DIAGNOSIS — R29818 Other symptoms and signs involving the nervous system: Secondary | ICD-10-CM

## 2018-06-12 NOTE — Progress Notes (Signed)
   Subjective:   Patient ID: Jaime [name], female    DOB: [birth year], 62 y.o.   MRN: [id number]  HPI The patient is a 62 YO female coming in for concerns her eye doctor has about a possible stroke. 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 She had an episode of where her eyes "crossed" on 06/01/18 and this sensation lasted <5 minutes. She closed her eyes for 30 seconds and then it was gone. This similar sensation happened once more. She denies loss of vision or double vision. She has been having a couple headaches recently which worries her. She has history of meningioma which was last imaged in 2018 without growth and she is on every 2 year schedule. She denies weight change. She denies any facial drooping or speech changes. She denies numbness or weakness anywhere on her body. She saw her eye doctor yesterday and they scheduled this visit. They did not send office notes.   Review of Systems  Constitutional: Negative.   HENT: Negative.   Eyes: Negative.   Respiratory: Negative for cough, chest tightness and shortness of breath.   Cardiovascular: Negative for chest pain, palpitations and leg swelling.  Gastrointestinal: Negative for abdominal distention, abdominal pain, constipation, diarrhea, nausea and vomiting.  Musculoskeletal: Negative.   Skin: Negative.   Neurological: Negative.   Psychiatric/Behavioral: Negative.     Objective:  Physical Exam Constitutional:      Appearance: She is well-developed.  HENT:     Head: Normocephalic and atraumatic.  Neck:     Musculoskeletal: Normal range of motion.  Cardiovascular:     Rate and Rhythm: Normal rate and regular rhythm.  Pulmonary:     Effort: Pulmonary effort is normal. No respiratory distress.     Breath sounds: Normal breath sounds. No wheezing or rales.  Abdominal:     General: Bowel sounds are normal. There is no distension.     Palpations: Abdomen is soft.     Tenderness: There is no abdominal  tenderness. There is no rebound.  Skin:    General: Skin is warm and dry.  Neurological:     Mental Status: She is alert and oriented to person, place, and time.     Cranial Nerves: No cranial nerve deficit.     Sensory: No sensory deficit.     Motor: No weakness.     Coordination: Coordination normal.     Gait: Gait normal.     Vitals:   06/12/18 1110  BP: 130/88  Pulse: 69  Temp: 98.2 F (36.8 C)  TempSrc: Oral  SpO2: 96%  Weight: 198 lb (89.8 kg)  Height: 5\' 6"  (1.676 m)    Assessment & Plan:  Visit time 25 minutes: greater than 50% of that time was spent in face to face counseling and coordination of care with the patient: counseled about possible problems which could be going on, trying to ask her about eye doctor findings, trying to contact her eye doctor (office closed due to christmas eve) and trying to arrange imaging.

## 2018-06-12 NOTE — Assessment & Plan Note (Signed)
Unclear what findings her eye specialist is going off to decide she has stroke. Patient was not informed of any eye findings on day of visit. We have attempted to contact her eye doctor's office however they are closed for the holiday. They have not sent [place] the office notes or told us the exact reason for visit so we have ordered MRI although it is unclear the exact reason for MRI. She does not have any neurological changes on exam today. She does have history of meningioma which if growing could cause some visual changes although I would not expect them to resolve in <5 minutes.

## 2018-06-12 NOTE — Assessment & Plan Note (Signed)
Having some headaches recently and this new eye finding. Reasonable to check MRI to evaluate for change.

## 2018-06-12 NOTE — Patient Instructions (Signed)
We will get the MRI scheduled and try to go from there. Dr. Zenia Resides office is closed today so we could not get any more information about what is going on.

## 2018-06-14 ENCOUNTER — Encounter: Payer: Self-pay | Admitting: Internal Medicine

## 2018-06-14 DIAGNOSIS — I6782 Cerebral ischemia: Secondary | ICD-10-CM | POA: Diagnosis not present

## 2018-06-14 DIAGNOSIS — R29818 Other symptoms and signs involving the nervous system: Secondary | ICD-10-CM | POA: Diagnosis not present

## 2018-06-18 ENCOUNTER — Telehealth: Payer: Self-pay

## 2018-06-18 NOTE — Telephone Encounter (Signed)
Patient informed and stated understanding.

## 2018-06-18 NOTE — Telephone Encounter (Signed)
Called imaging facility they are faxing results to [place]

## 2018-06-18 NOTE — Telephone Encounter (Signed)
I do not have results, can call imaging facility and see if they can fax [place] results or check fax first to see if we have results.

## 2018-06-18 NOTE — Telephone Encounter (Signed)
Copied from [place] [id number]. Topic: Quick Communication - Lab Results (Clinic Use ONLY) >> Jun 15, 2018 12:58 PM Jackey Loge, Lenna Sciara wrote: Patient calling to get results from an MRI done yesterday and it was STAT  Best call back is [phone] >> Jun 18, 2018 12:08 PM Scherrie Gerlach wrote: Pt had the MRI at [facility] on [address]. Pt was given the cd, and advised to have it read. Did you receive anything? She states the dr wanted STAT

## 2018-06-18 NOTE — Telephone Encounter (Signed)
Placed in your folder.

## 2018-06-18 NOTE — Telephone Encounter (Signed)
Call and let patient know no stroke on imaging.

## 2018-07-04 DIAGNOSIS — H25813 Combined forms of age-related cataract, bilateral: Secondary | ICD-10-CM | POA: Diagnosis not present

## 2018-07-04 DIAGNOSIS — H5021 Vertical strabismus, right eye: Secondary | ICD-10-CM | POA: Diagnosis not present

## 2018-07-19 DIAGNOSIS — Z5189 Encounter for other specified aftercare: Secondary | ICD-10-CM | POA: Diagnosis not present

## 2018-07-19 DIAGNOSIS — D329 Benign neoplasm of meninges, unspecified: Secondary | ICD-10-CM | POA: Diagnosis not present

## 2018-07-19 DIAGNOSIS — D320 Benign neoplasm of cerebral meninges: Secondary | ICD-10-CM | POA: Diagnosis not present

## 2018-07-19 DIAGNOSIS — Z872 Personal history of diseases of the skin and subcutaneous tissue: Secondary | ICD-10-CM | POA: Diagnosis not present

## 2018-07-19 DIAGNOSIS — Z923 Personal history of irradiation: Secondary | ICD-10-CM | POA: Diagnosis not present

## 2018-07-19 DIAGNOSIS — H532 Diplopia: Secondary | ICD-10-CM | POA: Diagnosis not present

## 2018-07-20 DIAGNOSIS — H25813 Combined forms of age-related cataract, bilateral: Secondary | ICD-10-CM | POA: Diagnosis not present

## 2018-07-20 DIAGNOSIS — H02889 Meibomian gland dysfunction of unspecified eye, unspecified eyelid: Secondary | ICD-10-CM | POA: Diagnosis not present

## 2018-07-20 DIAGNOSIS — H532 Diplopia: Secondary | ICD-10-CM | POA: Diagnosis not present

## 2018-07-20 DIAGNOSIS — D329 Benign neoplasm of meninges, unspecified: Secondary | ICD-10-CM | POA: Diagnosis not present

## 2018-07-20 DIAGNOSIS — H5021 Vertical strabismus, right eye: Secondary | ICD-10-CM | POA: Diagnosis not present

## 2018-08-01 IMAGING — MG DIGITAL SCREENING BILATERAL MAMMOGRAM WITH TOMO AND CAD
8 series · 8 of 24 positions shown · non-contrast
Comparison: Previous exam(s).

CLINICAL DATA: Screening.

EXAM:
DIGITAL SCREENING BILATERAL MAMMOGRAM WITH TOMO AND CAD

[R MLO synth-2D]
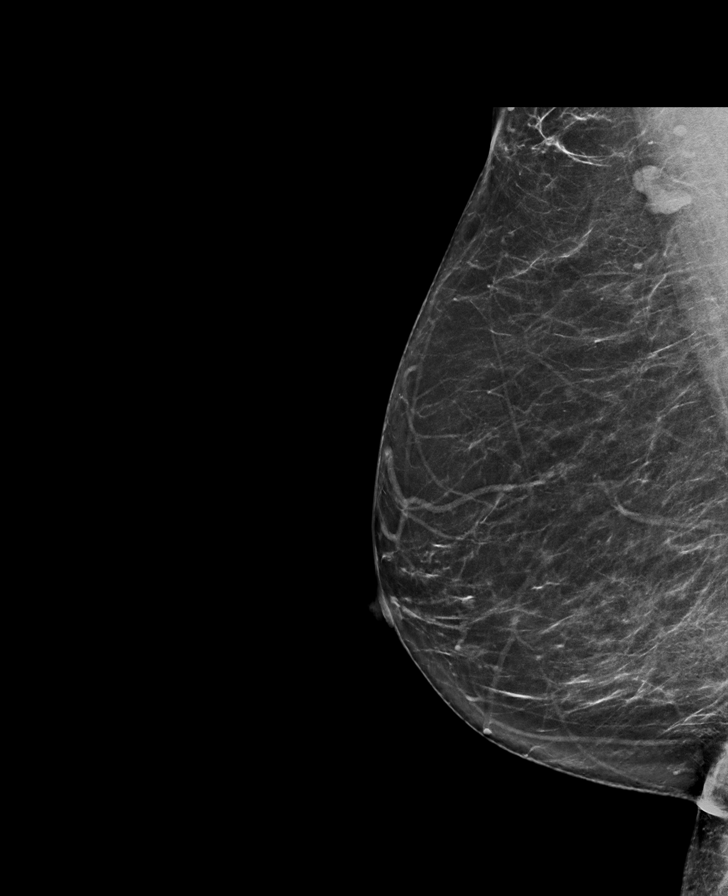

[R CC synth-2D]
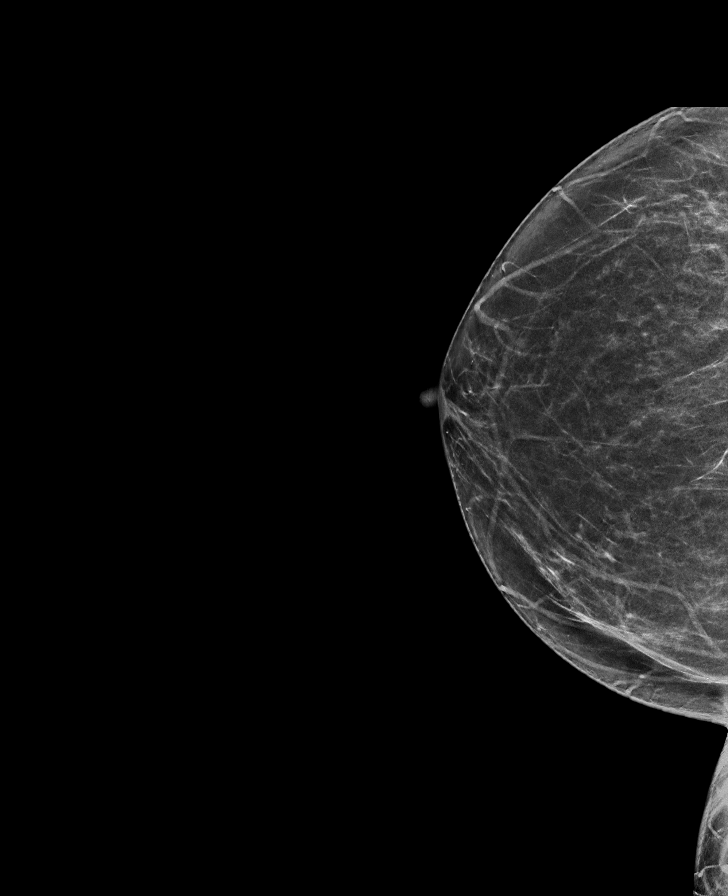

[L CC synth-2D]
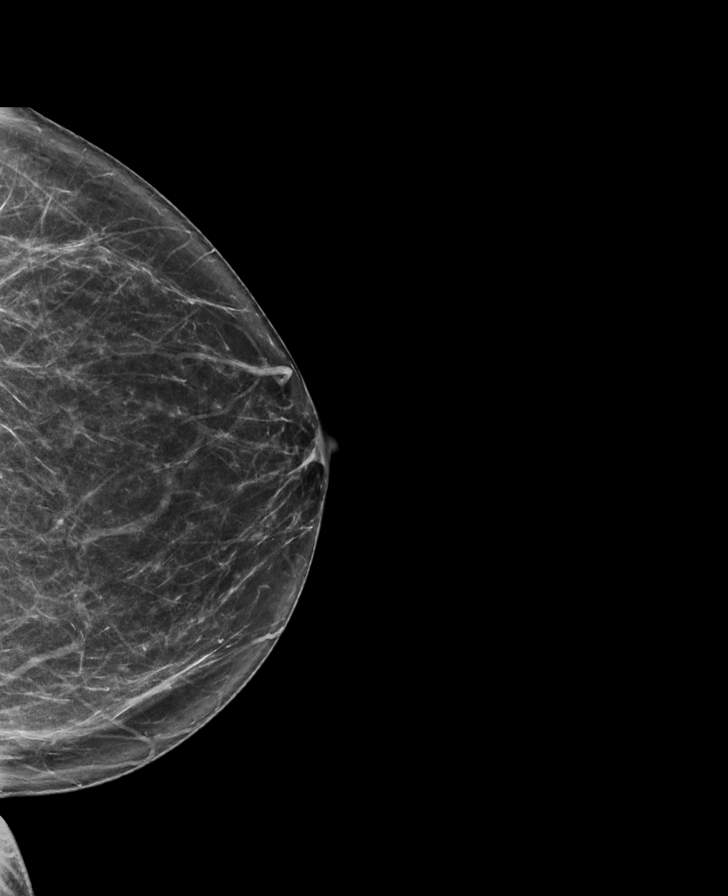

[L MLO synth-2D]
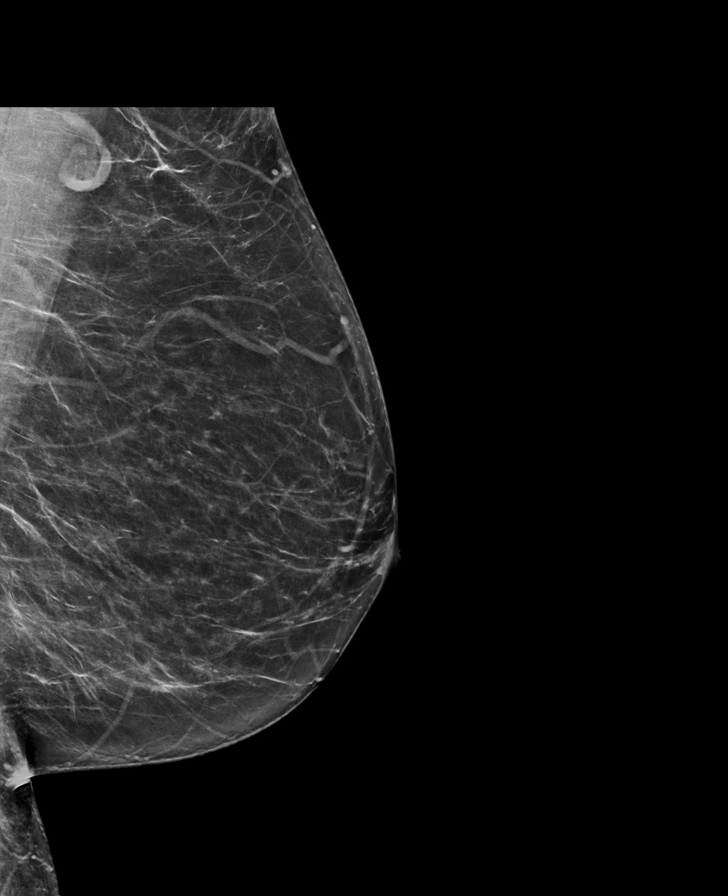

[R MLO tomo · tomo slice 39/77.0]
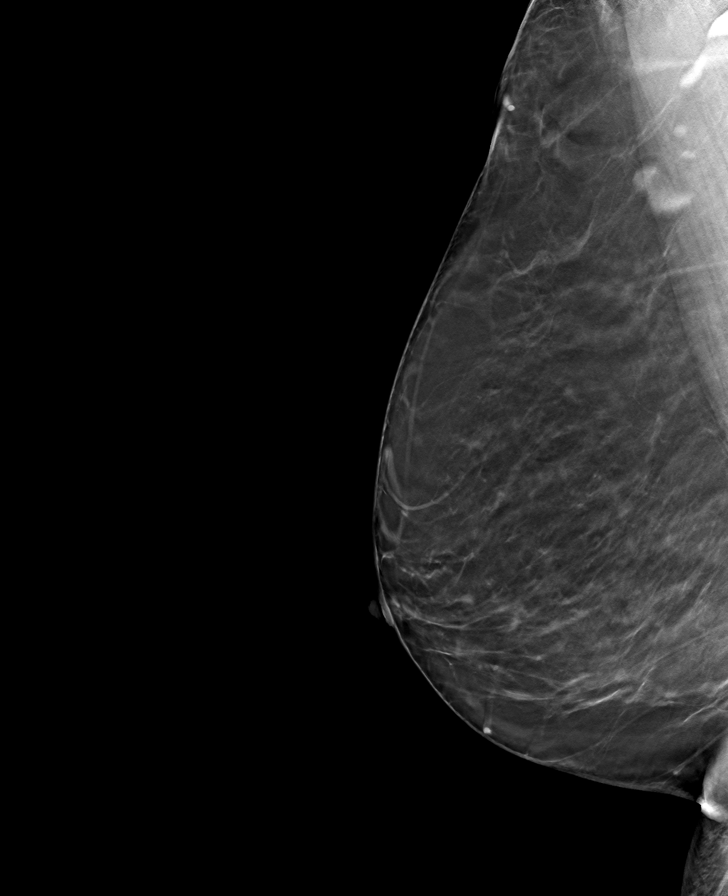

[L MLO tomo · tomo slice 39/76.0]
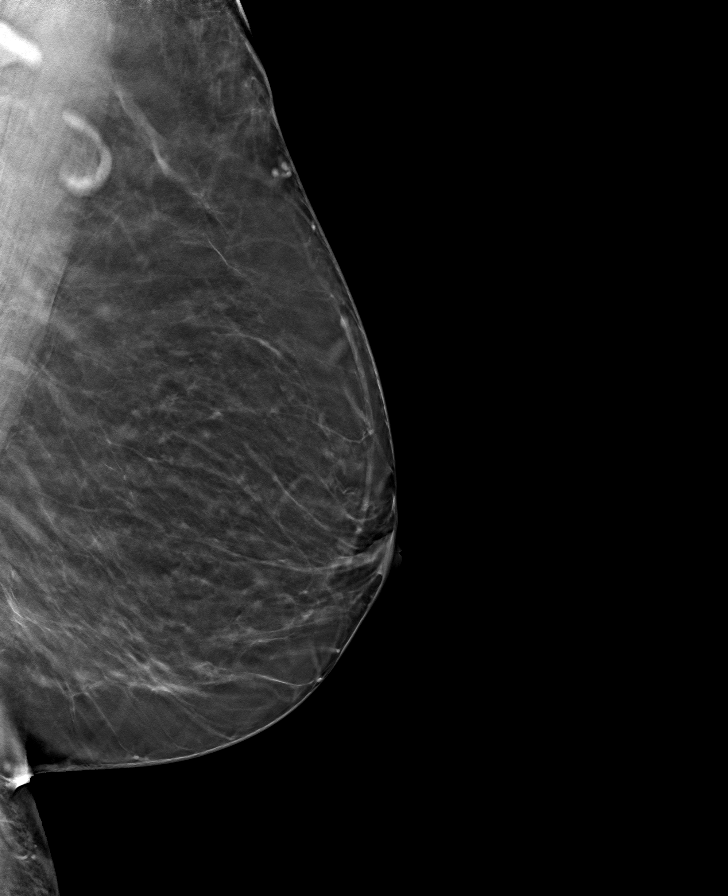

[L CC tomo · tomo slice 36/71.0]
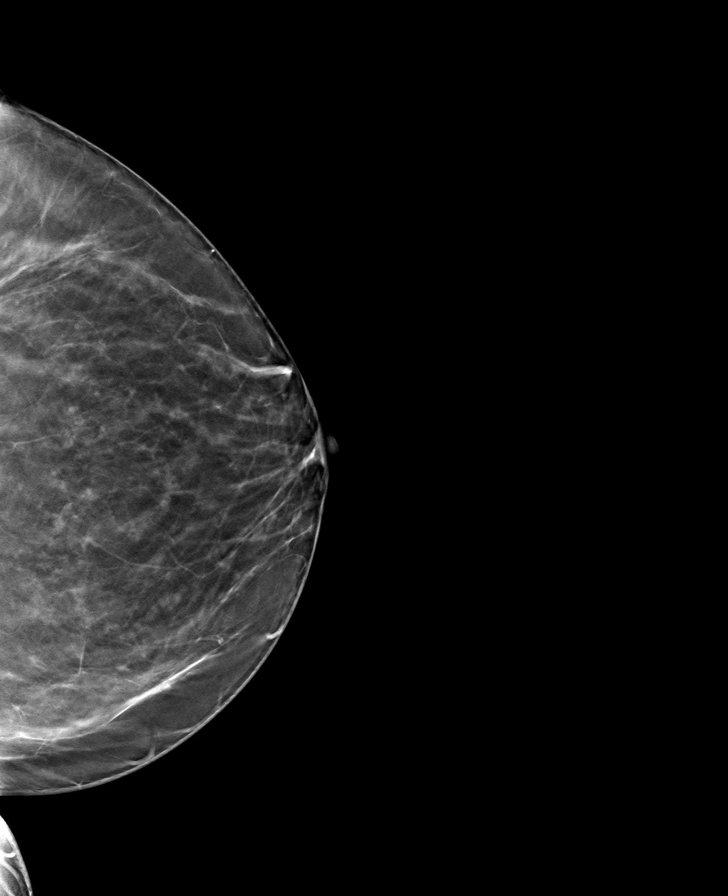

[R CC tomo · tomo slice 34/67.0]
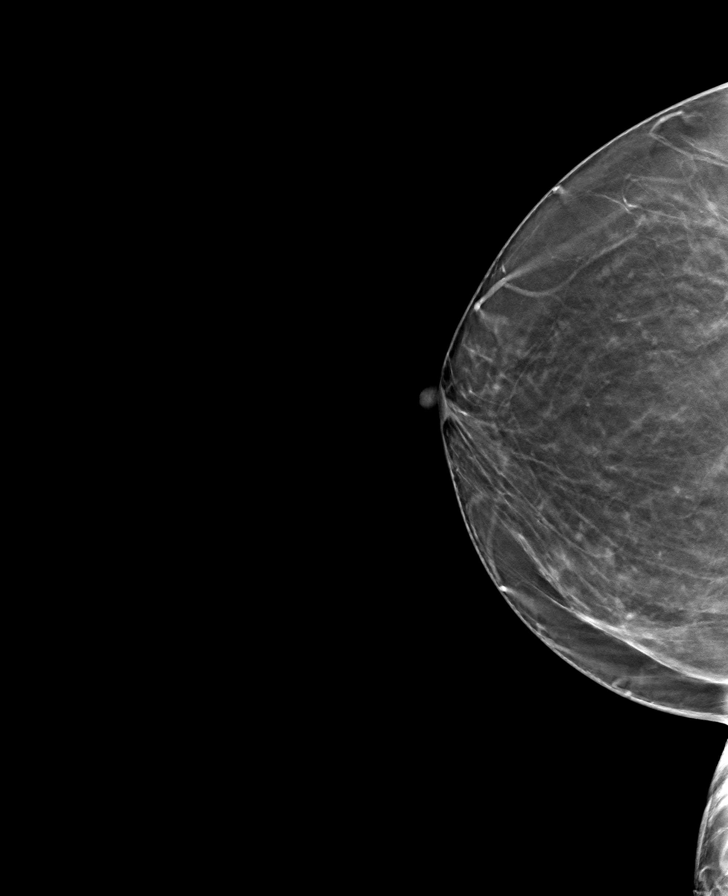

[8 of 24 positions shown; findings below may reference images not displayed]

ACR Breast Density Category b: There are scattered areas of
fibroglandular density.
FINDINGS: There are no findings suspicious for malignancy. Images were
processed with CAD.
IMPRESSION: No mammographic evidence of malignancy. A result letter of this
screening mammogram will be mailed directly to the patient.

RECOMMENDATION:
Screening mammogram in one year. (Code:[id number])

BI-RADS CATEGORY  1: Negative.

## 2018-09-04 DIAGNOSIS — H5021 Vertical strabismus, right eye: Secondary | ICD-10-CM | POA: Diagnosis not present

## 2018-09-04 DIAGNOSIS — H2513 Age-related nuclear cataract, bilateral: Secondary | ICD-10-CM | POA: Diagnosis not present

## 2018-09-24 ENCOUNTER — Ambulatory Visit: Payer: Self-pay | Admitting: *Deleted

## 2018-09-24 NOTE — Telephone Encounter (Signed)
Per initial patient encounter dated 09/23/2018 at 1057,  "Patient is calling because she got levothyroxine (SYNTHROID, LEVOTHROID) 50 MCG tablet [[id number]] from the pharmacy. She does not know what it is for."; contacted the pt and she states that she has never taken this medication before;reviewed common drug names, (levothyroxine, synthroid, levothyroid) and once again she states that she has never taken this medication;  this medication is on the pt's active medication last originally written by Dr Pricilla Holm, 10/18/17; the pt can be contacted at [phone], and a message can be left on he voicemail; will route to office for notification and final disposition.  Reason for Disposition . Caller requesting a NON-URGENT new prescription or refill and triager unable to refill per unit policy  Answer Assessment - Initial Assessment Questions 1. SYMPTOMS: "Do you have any symptoms?"     no 2. SEVERITY: If symptoms are present, ask "Are they mild, moderate or severe?"  n/a  Protocols used: MEDICATION QUESTION CALL-A-AH

## 2018-09-24 NOTE — Telephone Encounter (Signed)
Patient informed that she is to be on levothyroxine for her thyroid and to start taking it again daily 30 minutes before eating anything or taking other meds. Patient stated understanding

## 2018-09-24 NOTE — Telephone Encounter (Signed)
Duplicate encounter; see nurse triage note dated 09/24/2018 at 1120.

## 2019-01-02 DIAGNOSIS — H5021 Vertical strabismus, right eye: Secondary | ICD-10-CM | POA: Diagnosis not present

## 2019-01-02 DIAGNOSIS — H2513 Age-related nuclear cataract, bilateral: Secondary | ICD-10-CM | POA: Diagnosis not present

## 2019-01-02 DIAGNOSIS — H532 Diplopia: Secondary | ICD-10-CM | POA: Diagnosis not present

## 2019-01-03 ENCOUNTER — Other Ambulatory Visit: Payer: Self-pay | Admitting: Internal Medicine

## 2019-01-29 ENCOUNTER — Encounter: Payer: Self-pay | Admitting: Internal Medicine

## 2019-01-29 ENCOUNTER — Other Ambulatory Visit: Payer: Self-pay

## 2019-01-29 ENCOUNTER — Other Ambulatory Visit (INDEPENDENT_AMBULATORY_CARE_PROVIDER_SITE_OTHER): Payer: 59

## 2019-01-29 ENCOUNTER — Ambulatory Visit (INDEPENDENT_AMBULATORY_CARE_PROVIDER_SITE_OTHER): Payer: 59 | Admitting: Internal Medicine

## 2019-01-29 VITALS — BP 110/90 | HR 67 | Temp 98.7°F | Ht 66.0 in | Wt 194.0 lb

## 2019-01-29 DIAGNOSIS — I10 Essential (primary) hypertension: Secondary | ICD-10-CM

## 2019-01-29 DIAGNOSIS — F325 Major depressive disorder, single episode, in full remission: Secondary | ICD-10-CM | POA: Diagnosis not present

## 2019-01-29 DIAGNOSIS — K219 Gastro-esophageal reflux disease without esophagitis: Secondary | ICD-10-CM

## 2019-01-29 DIAGNOSIS — Z0000 Encounter for general adult medical examination without abnormal findings: Secondary | ICD-10-CM

## 2019-01-29 DIAGNOSIS — R5383 Other fatigue: Secondary | ICD-10-CM

## 2019-01-29 LAB — TSH: TSH: 2.15 u[IU]/mL (ref 0.35–4.50)

## 2019-01-29 LAB — COMPREHENSIVE METABOLIC PANEL
ALT: 8 U/L (ref 0–35)
AST: 13 U/L (ref 0–37)
Albumin: 3.8 g/dL (ref 3.5–5.2)
Alkaline Phosphatase: 93 U/L (ref 39–117)
BUN: 9 mg/dL (ref 6–23)
CO2: 29 mEq/L (ref 19–32)
Calcium: 8.9 mg/dL (ref 8.4–10.5)
Chloride: 104 mEq/L (ref 96–112)
Creatinine, Ser: 0.83 mg/dL (ref 0.40–1.20)
GFR: 84.17 mL/min (ref >60.00)
Glucose, Bld: 108 mg/dL — ABNORMAL HIGH (ref 70–99)
Potassium: 3.9 mEq/L (ref 3.5–5.1)
Sodium: 138 mEq/L (ref 135–145)
Total Bilirubin: 0.3 mg/dL (ref 0.2–1.2)
Total Protein: 6.6 g/dL (ref 6.0–8.3)

## 2019-01-29 LAB — LIPID PANEL
Cholesterol: 178 mg/dL (ref 0–200)
HDL: 37.5 mg/dL — ABNORMAL LOW (ref >39.00)
LDL Cholesterol: 121 mg/dL — ABNORMAL HIGH (ref 0–99)
NonHDL: 140.71
Total CHOL/HDL Ratio: 5
Triglycerides: 98 mg/dL (ref 0.0–149.0)
VLDL: 19.6 mg/dL (ref 0.0–40.0)

## 2019-01-29 LAB — CBC
HCT: 33.5 % — ABNORMAL LOW (ref 36.0–46.0)
Hemoglobin: 11.1 g/dL — ABNORMAL LOW (ref 12.0–15.0)
MCHC: 33.1 g/dL (ref 30.0–36.0)
MCV: 83.6 fl (ref 78.0–100.0)
Platelets: 229 10*3/uL (ref 150.0–400.0)
RBC: 4.01 Mil/uL (ref 3.87–5.11)
RDW: 16.8 % — ABNORMAL HIGH (ref 11.5–15.5)
WBC: 8.9 10*3/uL (ref 4.0–10.5)

## 2019-01-29 LAB — HEMOGLOBIN A1C: Hgb A1c MFr Bld: 6.8 % — ABNORMAL HIGH (ref 4.6–6.5)

## 2019-01-29 LAB — T4, FREE: Free T4: 0.71 ng/dL (ref 0.60–1.60)

## 2019-01-29 NOTE — Assessment & Plan Note (Signed)
Taking protonix and gets symptoms without.

## 2019-01-29 NOTE — Assessment & Plan Note (Signed)
Flu shot yearly. Shingrix complete. Tetanus up to date. Colonoscopy up to date. Mammogram up to date, pap smear up to date with gyn. Counseled about sun safety and mole surveillance. Counseled about the dangers of distracted driving. Given 10 year screening recommendations.

## 2019-01-29 NOTE — Assessment & Plan Note (Signed)
BP at goal on atenolol. Checking CMP and adjust as needed.  

## 2019-01-29 NOTE — Patient Instructions (Signed)
Health Maintenance, Female Adopting a healthy lifestyle and getting preventive care are important in promoting health and wellness. Ask your health care provider about:  The right schedule for you to have regular tests and exams.  Things you can do on your own to prevent diseases and keep yourself healthy. What should I know about diet, weight, and exercise? Eat a healthy diet   Eat a diet that includes plenty of vegetables, fruits, low-fat dairy products, and lean protein.  Do not eat a lot of foods that are high in solid fats, added sugars, or sodium. Maintain a healthy weight Body mass index (BMI) is used to identify weight problems. It estimates body fat based on height and weight. Your health care provider can help determine your BMI and help you achieve or maintain a healthy weight. Get regular exercise Get regular exercise. This is one of the most important things you can do for your health. Most adults should:  Exercise for at least 150 minutes each week. The exercise should increase your heart rate and make you sweat (moderate-intensity exercise).  Do strengthening exercises at least twice a week. This is in addition to the moderate-intensity exercise.  Spend less time sitting. Even light physical activity can be beneficial. Watch cholesterol and blood lipids Have your blood tested for lipids and cholesterol at 62 years of age, then have this test every 5 years. Have your cholesterol levels checked more often if:  Your lipid or cholesterol levels are high.  You are older than 62 years of age.  You are at high risk for heart disease. What should I know about cancer screening? Depending on your health history and family history, you may need to have cancer screening at various ages. This may include screening for:  Breast cancer.  Cervical cancer.  Colorectal cancer.  Skin cancer.  Lung cancer. What should I know about heart disease, diabetes, and high blood  pressure? Blood pressure and heart disease  High blood pressure causes heart disease and increases the risk of stroke. This is more likely to develop in people who have high blood pressure readings, are of African descent, or are overweight.  Have your blood pressure checked: ? Every 3-5 years if you are 18-39 years of age. ? Every year if you are 40 years old or older. Diabetes Have regular diabetes screenings. This checks your fasting blood sugar level. Have the screening done:  Once every three years after age 40 if you are at a normal weight and have a low risk for diabetes.  More often and at a younger age if you are overweight or have a high risk for diabetes. What should I know about preventing infection? Hepatitis B If you have a higher risk for hepatitis B, you should be screened for this virus. Talk with your health care provider to find out if you are at risk for hepatitis B infection. Hepatitis C Testing is recommended for:  Everyone born from 1945 through 1965.  Anyone with known risk factors for hepatitis C. Sexually transmitted infections (STIs)  Get screened for STIs, including gonorrhea and chlamydia, if: ? You are sexually active and are younger than 62 years of age. ? You are older than 62 years of age and your health care provider tells you that you are at risk for this type of infection. ? Your sexual activity has changed since you were last screened, and you are at increased risk for chlamydia or gonorrhea. Ask your health care provider if   you are at risk.  Ask your health care provider about whether you are at high risk for HIV. Your health care provider may recommend a prescription medicine to help prevent HIV infection. If you choose to take medicine to prevent HIV, you should first get tested for HIV. You should then be tested every 3 months for as long as you are taking the medicine. Pregnancy  If you are about to stop having your period (premenopausal) and  you may become pregnant, seek counseling before you get pregnant.  Take 400 to 800 micrograms (mcg) of folic acid every day if you become pregnant.  Ask for birth control (contraception) if you want to prevent pregnancy. Osteoporosis and menopause Osteoporosis is a disease in which the bones lose minerals and strength with aging. This can result in bone fractures. If you are 65 years old or older, or if you are at risk for osteoporosis and fractures, ask your health care provider if you should:  Be screened for bone loss.  Take a calcium or vitamin D supplement to lower your risk of fractures.  Be given hormone replacement therapy (HRT) to treat symptoms of menopause. Follow these instructions at home: Lifestyle  Do not use any products that contain nicotine or tobacco, such as cigarettes, e-cigarettes, and chewing tobacco. If you need help quitting, ask your health care provider.  Do not use street drugs.  Do not share needles.  Ask your health care provider for help if you need support or information about quitting drugs. Alcohol use  Do not drink alcohol if: ? Your health care provider tells you not to drink. ? You are pregnant, may be pregnant, or are planning to become pregnant.  If you drink alcohol: ? Limit how much you use to 0-1 drink a day. ? Limit intake if you are breastfeeding.  Be aware of how much alcohol is in your drink. In the [place] one drink equals one 12 oz bottle of beer (355 mL), one 5 oz glass of wine (148 mL), or one 1 oz glass of hard liquor (44 mL). General instructions  Schedule regular health, dental, and eye exams.  Stay current with your vaccines.  Tell your health care provider if: ? You often feel depressed. ? You have ever been abused or do not feel safe at home. Summary  Adopting a healthy lifestyle and getting preventive care are important in promoting health and wellness.  Follow your health care provider's instructions about healthy  diet, exercising, and getting tested or screened for diseases.  Follow your health care provider's instructions on monitoring your cholesterol and blood pressure. This information is not intended to replace advice given to you by your health care provider. Make sure you discuss any questions you have with your health care provider. Document Released: 12/20/2010 Document Revised: 05/30/2018 Document Reviewed: 05/30/2018 Elsevier Patient Education  2020 Elsevier Inc.  

## 2019-01-29 NOTE — Progress Notes (Signed)
   Subjective:   Patient ID: Jaime [name], female    DOB: 06/02/1956, 63 y.o.   MRN: [id number]  HPI The patient is a 63 YO female coming in for physical. Started thyroid meds last year but did not continue taking after refills ran out. Feels sluggish.   PMH, [facility], social history reviewed and updated  Review of Systems  Constitutional: Negative.   HENT: Negative.   Eyes: Negative.   Respiratory: Negative for cough, chest tightness and shortness of breath.   Cardiovascular: Negative for chest pain, palpitations and leg swelling.  Gastrointestinal: Negative for abdominal distention, abdominal pain, constipation, diarrhea, nausea and vomiting.  Endocrine:       Sluggish  Musculoskeletal: Negative.   Skin: Negative.   Neurological: Negative.   Psychiatric/Behavioral: Negative.     Objective:  Physical Exam Constitutional:      Appearance: She is well-developed. She is obese.  HENT:     Head: Normocephalic and atraumatic.  Neck:     Musculoskeletal: Normal range of motion.  Cardiovascular:     Rate and Rhythm: Normal rate and regular rhythm.  Pulmonary:     Effort: Pulmonary effort is normal. No respiratory distress.     Breath sounds: Normal breath sounds. No wheezing or rales.  Abdominal:     General: Bowel sounds are normal. There is no distension.     Palpations: Abdomen is soft.     Tenderness: There is no abdominal tenderness. There is no rebound.  Musculoskeletal:        General: No tenderness or signs of injury.  Skin:    General: Skin is warm and dry.  Neurological:     Mental Status: She is alert and oriented to person, place, and time.     Coordination: Coordination normal.     Vitals:   01/29/19 1434  BP: (!) 140/100  Pulse: 67  Temp: 98.7 F (37.1 C)  TempSrc: Oral  SpO2: 96%  Weight: 194 lb (88 kg)  Height: 5\' 6"  (1.676 m)    Assessment & Plan:

## 2019-01-29 NOTE — Assessment & Plan Note (Signed)
Stable off meds, coping okay with pandemic.

## 2019-01-29 NOTE — Assessment & Plan Note (Signed)
Checking TSH and free T4, levels were low last year and improved on synthroid 50 mcg daily but she stopped. Checking today and if okay will not resume.

## 2019-02-01 ENCOUNTER — Other Ambulatory Visit: Payer: Self-pay | Admitting: Internal Medicine

## 2019-02-05 ENCOUNTER — Telehealth: Payer: Self-pay | Admitting: Internal Medicine

## 2019-02-05 NOTE — Telephone Encounter (Signed)
Pt called in and stated that she would like to try a cholestrol medication and would like to get the referral to the [facility] to help control her sugar  Best number [facility] [address]  pantoprazole (PROTONIX) 40 MG tablet [[id number]]

## 2019-02-05 NOTE — Telephone Encounter (Signed)
Sent through lab results and patient informed that Dr. Sharlet Salina is not back till Monday and that the Protonix is already sent to pharmacy

## 2019-02-18 ENCOUNTER — Other Ambulatory Visit: Payer: Self-pay | Admitting: Internal Medicine

## 2019-02-18 DIAGNOSIS — E119 Type 2 diabetes mellitus without complications: Secondary | ICD-10-CM

## 2019-02-18 MED ORDER — ATORVASTATIN CALCIUM 20 MG PO TABS
20.0000 mg | ORAL_TABLET | Freq: Every day | ORAL | 3 refills | Status: DC
Start: 2019-02-18 — End: 2020-03-02

## 2019-02-22 ENCOUNTER — Ambulatory Visit (INDEPENDENT_AMBULATORY_CARE_PROVIDER_SITE_OTHER): Payer: 59

## 2019-02-22 ENCOUNTER — Other Ambulatory Visit: Payer: Self-pay

## 2019-02-22 DIAGNOSIS — Z23 Encounter for immunization: Secondary | ICD-10-CM | POA: Diagnosis not present

## 2019-04-02 ENCOUNTER — Other Ambulatory Visit: Payer: Self-pay

## 2019-04-02 ENCOUNTER — Encounter: Payer: 59 | Attending: Internal Medicine | Admitting: *Deleted

## 2019-04-02 DIAGNOSIS — E119 Type 2 diabetes mellitus without complications: Secondary | ICD-10-CM

## 2019-04-02 NOTE — Patient Instructions (Signed)
Plan:  Aim for 2-3 Carb Choices per meal (30-45 grams)  Aim for 0-2 Carbs per snack if hungry  Include protein in moderation with your meals and snacks Consider reading food labels for Total Carbohydrate of foods Consider  increasing your activity level by walking or chair exercises for 5-10 minutes about twice daily as tolerated You can look for Chair Exercise Videos on the computer for more ideas Consider the idea of getting a meter so you can start checking BG at alternate times per day

## 2019-04-03 NOTE — Progress Notes (Signed)
Diabetes Self-Management Education  Visit Type: First/Initial  Appt. Start Time: 1400 Appt. End Time: [id number]  04/03/2019  Jaime [name], identified by name and date of birth, is a 63 y.o. female with a diagnosis of Diabetes: Type 2. Patient is newly diagnosed with Diabetes. She cares for her 58 year old mother, living with her so she doesn't have to drive across town to take care of her. She would like to learn what foods she should be eating now that she has Diabetes.  ASSESSMENT  There were no vitals taken for this visit. There is no height or weight on file to calculate BMI.  Diabetes Self-Management Education - 04/02/19 1415      Visit Information   Visit Type  First/Initial      Initial Visit   Diabetes Type  Type 2    Are you currently following a meal plan?  No    Are you taking your medications as prescribed?  Not on Medications    Date Diagnosed  01/29/2019      Health Coping   How would you rate your overall health?  Good      Psychosocial Assessment   Patient Belief/Attitude about Diabetes  Motivated to manage diabetes    Self-care barriers  None    Self-management support  Family    Other persons present  Patient    Patient Concerns  Nutrition/Meal planning;Glycemic Control    Special Needs  None    Preferred Learning Style  Auditory;Visual;Hands on    [place] in progress    How often do you need to have someone help you when you read instructions, pamphlets, or other written materials from your doctor or pharmacy?  1 - Never    What is the last grade level you completed in school?  Associates Degree      Pre-Education Assessment   Patient understands the diabetes disease and treatment process.  Needs Instruction    Patient understands incorporating nutritional management into lifestyle.  Needs Instruction    Patient undertands incorporating physical activity into lifestyle.  Needs Instruction    Patient understands monitoring blood  glucose, interpreting and using results  Needs Instruction    Patient understands prevention, detection, and treatment of acute complications.  Needs Instruction    Patient understands prevention, detection, and treatment of chronic complications.  Needs Instruction    Patient understands how to develop strategies to address psychosocial issues.  Needs Instruction    Patient understands how to develop strategies to promote health/change behavior.  Needs Instruction      Complications   Last HgB A1C per patient/outside source  6.8 %    How often do you check your blood sugar?  0 times/day (not testing)    Have you had a dilated eye exam in the past 12 months?  Yes    Have you had a dental exam in the past 12 months?  No    Are you checking your feet?  Yes    How many days per week are you checking your feet?  3      Dietary Intake   Breakfast  with mother; applesauce, toast, sausage OR grits OR waffles    Snack (morning)  occasionally strawberries OR fruit cup    Lunch  sandwich with chips OR left overs    Snack (afternoon)  occasionally, same as AM    Dinner  she may cook for herself and her mother: meat or fish, may have  a starch, usually vegetables including lima beans, potatoes and corn    Snack (evening)  sometimes strawberries, chips or candy bar    Beverage(s)  hot or cold sweet tea, chocolate milk, water, regular soda      Exercise   Exercise Type  ADL's    How many days per week to you exercise?  0    How many minutes per day do you exercise?  0    Total minutes per week of exercise  0      Patient Education   Previous Diabetes Education  No      Individualized Goals (developed by patient)   Nutrition  Follow meal plan discussed    Physical Activity  Exercise 3-5 times per week    Medications  take my medication as prescribed    Monitoring   Other (comment)   Consider getting a meter so you can check BG between MD visits     Post-Education Assessment   Patient  understands the diabetes disease and treatment process.  Demonstrates understanding / competency    Patient understands incorporating nutritional management into lifestyle.  Demonstrates understanding / competency    Patient undertands incorporating physical activity into lifestyle.  Demonstrates understanding / competency    Patient understands prevention, detection, and treatment of chronic complications.  Demonstrates understanding / competency    Patient understands how to develop strategies to address psychosocial issues.  Demonstrates understanding / competency    Patient understands how to develop strategies to promote health/change behavior.  Demonstrates understanding / competency      Outcomes   Expected Outcomes  Demonstrated interest in learning. Expect positive outcomes    Future DMSE  PRN    Program Status  Not Completed       Individualized Plan for Diabetes Self-Management Training:   Learning Objective:  Patient will have a greater understanding of diabetes self-management. Patient education plan is to attend individual and/or group sessions per assessed needs and concerns.   Plan:   Patient Instructions  Plan:  Aim for 2-3 Carb Choices per meal (30-45 grams)  Aim for 0-2 Carbs per snack if hungry  Include protein in moderation with your meals and snacks Consider reading food labels for Total Carbohydrate of foods Consider  increasing your activity level by walking or chair exercises for 5-10 minutes about twice daily as tolerated You can look for Chair Exercise Videos on the computer for more ideas Consider the idea of getting a meter so you can start checking BG at alternate times per day   Expected Outcomes:  Demonstrated interest in learning. Expect positive outcomes  Education material provided: Food label handouts, A1C conversion sheet, Meal plan card and Carbohydrate counting sheet  If problems or questions, patient to contact team via:  Phone  Future DSME  appointment: PRN

## 2019-04-04 ENCOUNTER — Other Ambulatory Visit: Payer: Self-pay | Admitting: Internal Medicine

## 2019-08-28 LAB — HM DIABETES EYE EXAM

## 2019-09-12 DIAGNOSIS — H5021 Vertical strabismus, right eye: Secondary | ICD-10-CM | POA: Diagnosis not present

## 2019-09-12 DIAGNOSIS — D320 Benign neoplasm of cerebral meninges: Secondary | ICD-10-CM | POA: Diagnosis not present

## 2019-09-12 DIAGNOSIS — H2513 Age-related nuclear cataract, bilateral: Secondary | ICD-10-CM | POA: Diagnosis not present

## 2019-09-12 DIAGNOSIS — H532 Diplopia: Secondary | ICD-10-CM | POA: Diagnosis not present

## 2019-09-14 DIAGNOSIS — M25511 Pain in right shoulder: Secondary | ICD-10-CM | POA: Diagnosis not present

## 2019-09-16 ENCOUNTER — Other Ambulatory Visit: Payer: Self-pay | Admitting: Internal Medicine

## 2019-10-23 DIAGNOSIS — M25511 Pain in right shoulder: Secondary | ICD-10-CM | POA: Diagnosis not present

## 2019-10-25 ENCOUNTER — Other Ambulatory Visit: Payer: Self-pay | Admitting: Orthopaedic Surgery

## 2019-10-25 DIAGNOSIS — M25511 Pain in right shoulder: Secondary | ICD-10-CM

## 2019-10-29 ENCOUNTER — Other Ambulatory Visit: Payer: Self-pay | Admitting: Internal Medicine

## 2019-10-29 DIAGNOSIS — Z1231 Encounter for screening mammogram for malignant neoplasm of breast: Secondary | ICD-10-CM

## 2019-11-10 ENCOUNTER — Other Ambulatory Visit: Payer: Self-pay

## 2019-11-10 ENCOUNTER — Ambulatory Visit
Admission: RE | Admit: 2019-11-10 | Discharge: 2019-11-10 | Disposition: A | Discharge status: Home / Self Care | Payer: 59 | Source: Ambulatory Visit | Attending: Orthopaedic Surgery | Admitting: Orthopaedic Surgery

## 2019-11-10 DIAGNOSIS — M25511 Pain in right shoulder: Secondary | ICD-10-CM | POA: Diagnosis not present

## 2019-11-13 DIAGNOSIS — M25511 Pain in right shoulder: Secondary | ICD-10-CM | POA: Diagnosis not present

## 2019-12-24 DIAGNOSIS — M75111 Incomplete rotator cuff tear or rupture of right shoulder, not specified as traumatic: Secondary | ICD-10-CM | POA: Diagnosis not present

## 2019-12-24 DIAGNOSIS — G8918 Other acute postprocedural pain: Secondary | ICD-10-CM | POA: Diagnosis not present

## 2019-12-24 DIAGNOSIS — M19011 Primary osteoarthritis, right shoulder: Secondary | ICD-10-CM | POA: Diagnosis not present

## 2020-02-23 ENCOUNTER — Other Ambulatory Visit: Payer: Self-pay | Admitting: Internal Medicine

## 2020-03-02 ENCOUNTER — Ambulatory Visit (INDEPENDENT_AMBULATORY_CARE_PROVIDER_SITE_OTHER): Payer: 59 | Admitting: Internal Medicine

## 2020-03-02 ENCOUNTER — Other Ambulatory Visit: Payer: Self-pay

## 2020-03-02 ENCOUNTER — Encounter: Payer: Self-pay | Admitting: Internal Medicine

## 2020-03-02 VITALS — BP 146/84 | HR 65 | Temp 98.5°F | Ht 66.0 in | Wt 194.0 lb

## 2020-03-02 DIAGNOSIS — D649 Anemia, unspecified: Secondary | ICD-10-CM | POA: Diagnosis not present

## 2020-03-02 DIAGNOSIS — E118 Type 2 diabetes mellitus with unspecified complications: Secondary | ICD-10-CM | POA: Diagnosis not present

## 2020-03-02 DIAGNOSIS — Z23 Encounter for immunization: Secondary | ICD-10-CM | POA: Diagnosis not present

## 2020-03-02 DIAGNOSIS — R9431 Abnormal electrocardiogram [ECG] [EKG]: Secondary | ICD-10-CM | POA: Insufficient documentation

## 2020-03-02 DIAGNOSIS — E785 Hyperlipidemia, unspecified: Secondary | ICD-10-CM | POA: Insufficient documentation

## 2020-03-02 DIAGNOSIS — R0600 Dyspnea, unspecified: Secondary | ICD-10-CM | POA: Insufficient documentation

## 2020-03-02 DIAGNOSIS — R0609 Other forms of dyspnea: Secondary | ICD-10-CM | POA: Insufficient documentation

## 2020-03-02 DIAGNOSIS — Z0000 Encounter for general adult medical examination without abnormal findings: Secondary | ICD-10-CM

## 2020-03-02 DIAGNOSIS — Z124 Encounter for screening for malignant neoplasm of cervix: Secondary | ICD-10-CM

## 2020-03-02 DIAGNOSIS — I10 Essential (primary) hypertension: Secondary | ICD-10-CM

## 2020-03-02 LAB — CK: Total CK: 130 U/L (ref 29–143)

## 2020-03-02 LAB — TROPONIN I (HIGH SENSITIVITY): High Sens Troponin I: 3 ng/L (ref 2–17)

## 2020-03-02 MED ORDER — NEBIVOLOL HCL 10 MG PO TABS: 10.0000 mg | ORAL_TABLET | Freq: Every day | ORAL | 0 refills | Status: DC

## 2020-03-02 MED ORDER — ATORVASTATIN CALCIUM 20 MG PO TABS: 20.0000 mg | ORAL_TABLET | Freq: Every day | ORAL | 1 refills | Status: DC

## 2020-03-02 NOTE — Patient Instructions (Signed)

## 2020-03-02 NOTE — Progress Notes (Signed)
Subjective:  Patient ID: Domingo Mend, female    DOB: August 04, 1955  Age: 64 y.o. MRN: [id number]  CC: Anemia, Annual Exam, Hyperlipidemia, Diabetes, and Hypothyroidism  This visit occurred during the SARS-CoV-2 public health emergency.  Safety protocols were in place, including screening questions prior to the visit, additional usage of staff PPE, and extensive cleaning of exam room while observing appropriate contact time as indicated for disinfecting solutions.   NEW TO ME  HPI URIJAH RAYNOR presents for a CPX.  She complains of a 1 year history of dyspnea on exertion when on an incline.  She denies chest pain, diaphoresis, palpitations, edema, fatigue, or dizziness, or lightheadedness.  She is under the impression that she is prediabetic.  She tells me she has never been formally diagnosed or treated for diabetes.  Treated she does work on her lifestyle modifications and denies polys.   Outpatient Medications Prior to Visit  Medication Sig Dispense Refill  . aspirin 325 MG tablet Take 81 mg by mouth daily.     . Multiple Vitamins-Minerals (MULTIVITAMINS THER. W/MINERALS) TABS Take 1 tablet by mouth as needed.     . pantoprazole (PROTONIX) 40 MG tablet TAKE 1 TABLET BY MOUTH EVERY MORNING 30 tablet 5  . atenolol (TENORMIN) 100 MG tablet Take 1 tablet (100 mg total) by mouth daily. 90 tablet 2  . atorvastatin (LIPITOR) 20 MG tablet Take 1 tablet (20 mg total) by mouth daily. 90 tablet 3  . gabapentin (NEURONTIN) 100 MG capsule Take 1 capsule (100 mg total) by mouth 3 (three) times daily. 90 capsule 3  . levothyroxine (SYNTHROID, LEVOTHROID) 50 MCG tablet Take 1 tablet (50 mcg total) by mouth daily. 90 tablet 1   No facility-administered medications prior to visit.    ROS Review of Systems  Constitutional: Negative.  Negative for appetite change, diaphoresis, fatigue and unexpected weight change.  HENT: Negative.   Eyes: Negative.   Respiratory: Positive for shortness of  breath (DOE). Negative for chest tightness and wheezing.   Cardiovascular: Negative for chest pain, palpitations and leg swelling.  Gastrointestinal: Negative for abdominal pain, constipation, diarrhea, nausea and vomiting.  Endocrine: Negative.   Genitourinary: Negative.  Negative for difficulty urinating.  Musculoskeletal: Negative.  Negative for arthralgias and myalgias.  Skin: Negative.  Negative for color change, pallor and rash.  Neurological: Negative.  Negative for dizziness, weakness, light-headedness and numbness.  Hematological: Negative for adenopathy. Does not bruise/bleed easily.  Psychiatric/Behavioral: Negative.     Objective:  BP (!) 146/84   Pulse 65   Temp 98.5 F (36.9 C) (Oral)   Ht 5\' 6"  (1.676 m)   Wt 194 lb (88 kg)   SpO2 96%   BMI 31.31 kg/m   BP Readings from Last 3 Encounters:  03/02/20 (!) 146/84  01/29/19 110/90  06/12/18 130/88    Wt Readings from Last 3 Encounters:  03/02/20 194 lb (88 kg)  01/29/19 194 lb (88 kg)  06/12/18 198 lb (89.8 kg)    Physical Exam Vitals reviewed.  HENT:     Nose: Nose normal.     Mouth/Throat:     Mouth: Mucous membranes are moist.  Eyes:     General: No scleral icterus.    Conjunctiva/sclera: Conjunctivae normal.  Cardiovascular:     Rate and Rhythm: Regular rhythm. Bradycardia present.     Pulses: Normal pulses.     Heart sounds: No murmur heard.      Comments: EKG-  Sinus bradycardia, 51 bpm TWI  in inf/ant/lat leads - more widespread than on the previous EKG +LVH Pulmonary:     Effort: Pulmonary effort is normal.     Breath sounds: No stridor. No wheezing, rhonchi or rales.  Abdominal:     General: Abdomen is protuberant. Bowel sounds are normal. There is no distension.     Palpations: Abdomen is soft. There is no hepatomegaly, splenomegaly or mass.     Tenderness: There is no abdominal tenderness.  Musculoskeletal:        General: No swelling. Normal range of motion.     Cervical back: Neck  supple.     Right lower leg: No edema.     Left lower leg: No edema.  Skin:    General: Skin is dry.  Neurological:     General: No focal deficit present.     Mental Status: She is alert and oriented to person, place, and time. Mental status is at baseline.  Psychiatric:        Mood and Affect: Mood normal.        Behavior: Behavior normal.     Lab Results  Component Value Date   WBC 9.3 03/02/2020   HGB 11.3 (L) 03/02/2020   HCT 34.9 (L) 03/02/2020   PLT 254 03/02/2020   GLUCOSE 104 (H) 03/02/2020   CHOL 139 03/02/2020   TRIG 72 03/02/2020   HDL 41 (L) 03/02/2020   LDLCALC 83 03/02/2020   ALT 9 03/02/2020   AST 17 03/02/2020   NA 139 03/02/2020   K 4.0 03/02/2020   CL 102 03/02/2020   CREATININE 0.84 03/02/2020   BUN 11 03/02/2020   CO2 30 03/02/2020   TSH 2.19 03/02/2020   HGBA1C 6.7 (H) 03/02/2020   MICROALBUR 2.5 03/02/2020    MR SHOULDER RIGHT WO CONTRAST  Result Date: 11/11/2019 CLINICAL DATA:  Chronic progressive right shoulder pain and limited range of motion. EXAM: MRI OF THE RIGHT SHOULDER WITHOUT CONTRAST TECHNIQUE: Multiplanar, multisequence MR imaging of the shoulder was performed. No intravenous contrast was administered. COMPARISON:  None. FINDINGS: Rotator cuff: There is slight degeneration of the distal supraspinatus tendon without a tear. The remainder of the rotator cuff is normal. Muscles: No atrophy or abnormal signal of the muscles of the rotator cuff. Biceps long head: Properly located and intact. Intrinsic degeneration of the intra-articular portion of the tendon. Acromioclavicular Joint: Minimal AC joint arthropathy. Type 2 acromion. No bursitis. Glenohumeral Joint: No joint effusion. No chondral defect. Labrum:  Intact. Bones: Minimal cystic degenerative changes of the greater tuberosity at the infraspinatus and supraspinatus insertions. Other: None IMPRESSION: 1. Slight degeneration of the distal supraspinatus tendon without a tear. 2. Intrinsic  degeneration of the intra-articular portion of the long head of the biceps tendon. Electronically Signed   By: Lorriane Shire M.D.   On: 11/11/2019 09:42    Assessment & Plan:   Marua was seen today for anemia, annual exam, hyperlipidemia, diabetes and hypothyroidism.  Diagnoses and all orders for this visit:  Essential hypertension- Her blood pressure is not adequately well controlled.  I recommended that she start taking nebivolol.  Also, I recommended she start taking an SGLT2 inhibitor and I anticipate this will help her achieve her blood pressure goal of 130/80. -     BASIC METABOLIC PANEL WITH GFR; Future -     Urinalysis, Routine w reflex microscopic; Future -     TSH; Future -     EKG 12-Lead -     nebivolol (BYSTOLIC) 10  MG tablet; Take 1 tablet (10 mg total) by mouth daily. -     TSH -     Urinalysis, Routine w reflex microscopic -     BASIC METABOLIC PANEL WITH GFR  Normocytic anemia- Her H&H have improved slightly.  Screening for vitamin deficiencies is unremarkable.  This may be the anemia of chronic disease.  I will continue to follow this. -     CBC with Differential/Platelet; Future -     Vitamin B12; Future -     Iron; Future -     Ferritin; Future -     Folate; Future -     Reticulocytes; Future -     Reticulocytes -     Folate -     Ferritin -     Iron -     Vitamin B12 -     CBC with Differential/Platelet  Routine general medical examination at a health care facility- Exam completed, labs reviewed, vaccines reviewed and updated, cancer addressed are up-to-date, patient education material was given. -     Lipid panel; Future -     HIV Antibody (routine testing w rflx); Future -     HIV Antibody (routine testing w rflx) -     Lipid panel  Type II diabetes mellitus with manifestations ([place])- I recommended that she treat this with Metformin and an SGLT2 inhibitor. -     BASIC METABOLIC PANEL WITH GFR; Future -     Microalbumin / creatinine urine ratio;  Future -     Urinalysis, Routine w reflex microscopic; Future -     Hemoglobin A1c; Future -     HM Diabetes Foot Exam -     Hemoglobin A1c -     Urinalysis, Routine w reflex microscopic -     Microalbumin / creatinine urine ratio -     BASIC METABOLIC PANEL WITH GFR -     Empagliflozin-metFORMIN HCl (SYNJARDY) 5-500 MG TABS; Take 1 tablet by mouth 2 (two) times daily.  Hyperlipidemia LDL goal <130- She has achieved her LDL goal and is doing well on the statin. -     TSH; Future -     Hepatic function panel; Future -     atorvastatin (LIPITOR) 20 MG tablet; Take 1 tablet (20 mg total) by mouth daily. -     CK; Future -     CK -     Hepatic function panel -     TSH  DOE (dyspnea on exertion)- She has symptoms that are suspicious for angina.  She has worsening EKG changes.  She has a history of carotid artery stenosis.  She is high risk for CAD.  Her troponin and BNP are normal.  I recommended that she undergo a myocardial perfusion imaging. -     Brain natriuretic peptide; Future -     Troponin I (High Sensitivity); Future -     Troponin I (High Sensitivity) -     Brain natriuretic peptide -     MYOCARDIAL PERFUSION IMAGING; Future  EKG, abnormal- See above. -     MYOCARDIAL PERFUSION IMAGING; Future  Other orders -     Flu Vaccine QUAD 6+ mos PF IM (Fluarix Quad PF) -     Pneumococcal polysaccharide vaccine 23-valent greater than or equal to 2yo subcutaneous/IM -     MICROSCOPIC MESSAGE   I have discontinued Zenia Resides. Neider's gabapentin, levothyroxine, and atenolol. I am also having her start on nebivolol and Synjardy. Additionally,  I am having her maintain her multivitamins ther. w/minerals, aspirin, pantoprazole, and atorvastatin.  Meds ordered this encounter  Medications  . atorvastatin (LIPITOR) 20 MG tablet    Sig: Take 1 tablet (20 mg total) by mouth daily.    Dispense:  90 tablet    Refill:  1  . nebivolol (BYSTOLIC) 10 MG tablet    Sig: Take 1 tablet (10 mg  total) by mouth daily.    Dispense:  90 tablet    Refill:  0  . Empagliflozin-metFORMIN HCl (SYNJARDY) 5-500 MG TABS    Sig: Take 1 tablet by mouth 2 (two) times daily.    Dispense:  180 tablet    Refill:  1   In addition to time spent on CPE, I spent 50 minutes in preparing to see the patient by review of recent labs, imaging and procedures, obtaining and reviewing separately obtained history, communicating with the patient and family or caregiver, ordering medications, tests or procedures, and documenting clinical information in the EHR including the differential Dx, treatment, and any further evaluation and other management of 1. Essential hypertension 2. Normocytic anemia 3. Type II diabetes mellitus with manifestations ([place]) 4. Hyperlipidemia LDL goal <130 5. DOE (dyspnea on exertion) 6. EKG, abnormal   Follow-up: Return in about 4 weeks (around 03/30/2020).  Scarlette Calico, MD

## 2020-03-03 ENCOUNTER — Encounter: Payer: Self-pay | Admitting: Obstetrics and Gynecology

## 2020-03-03 DIAGNOSIS — Z124 Encounter for screening for malignant neoplasm of cervix: Secondary | ICD-10-CM | POA: Insufficient documentation

## 2020-03-03 LAB — HEMOGLOBIN A1C
Hgb A1c MFr Bld: 6.7 % of total Hgb — ABNORMAL HIGH (ref <5.7)
Mean Plasma Glucose: 146 (calc)
eAG (mmol/L): 8.1 (calc)

## 2020-03-03 LAB — RETICULOCYTES
ABS Retic: 60600 cells/uL (ref 20000–8000)
Retic Ct Pct: 1.5 %

## 2020-03-03 LAB — CBC WITH DIFFERENTIAL/PLATELET
Absolute Monocytes: 679 cells/uL (ref 200–950)
Basophils Absolute: 93 cells/uL (ref 0–200)
Basophils Relative: 1 %
Eosinophils Absolute: 456 cells/uL (ref 15–500)
Eosinophils Relative: 4.9 %
HCT: 34.9 % — ABNORMAL LOW (ref 35.0–45.0)
Hemoglobin: 11.3 g/dL — ABNORMAL LOW (ref 11.7–15.5)
Lymphs Abs: 2697 cells/uL (ref 850–3900)
MCH: 28.1 pg (ref 27.0–33.0)
MCHC: 32.4 g/dL (ref 32.0–36.0)
MCV: 86.8 fL (ref 80.0–100.0)
MPV: 11.4 fL (ref 7.5–12.5)
Monocytes Relative: 7.3 %
Neutro Abs: 5375 cells/uL (ref 1500–7800)
Neutrophils Relative %: 57.8 %
Platelets: 254 10*3/uL (ref 140–400)
RBC: 4.02 10*6/uL (ref 3.80–5.10)
RDW: 15 % (ref 11.0–15.0)
Total Lymphocyte: 29 %
WBC: 9.3 10*3/uL (ref 3.8–10.8)

## 2020-03-03 LAB — URINALYSIS, ROUTINE W REFLEX MICROSCOPIC
Bilirubin Urine: NEGATIVE
Glucose, UA: NEGATIVE
Hgb urine dipstick: NEGATIVE
Ketones, ur: NEGATIVE
Nitrite: NEGATIVE
Protein, ur: NEGATIVE
Specific Gravity, Urine: 1.026 (ref 1.001–1.03)
pH: 5.5 (ref 5.0–8.0)

## 2020-03-03 LAB — HEPATIC FUNCTION PANEL
AG Ratio: 1.3 (calc) (ref 1.0–2.5)
ALT: 9 U/L (ref 6–29)
AST: 17 U/L (ref 10–35)
Albumin: 3.9 g/dL (ref 3.6–5.1)
Alkaline phosphatase (APISO): 123 U/L (ref 37–153)
Bilirubin, Direct: 0.2 mg/dL (ref 0.0–0.2)
Globulin: 2.9 g/dL (calc) (ref 1.9–3.7)
Indirect Bilirubin: 0.5 mg/dL (calc) (ref 0.2–1.2)
Total Bilirubin: 0.7 mg/dL (ref 0.2–1.2)
Total Protein: 6.8 g/dL (ref 6.1–8.1)

## 2020-03-03 LAB — BASIC METABOLIC PANEL WITH GFR
BUN: 11 mg/dL (ref 7–25)
CO2: 30 mmol/L (ref 20–32)
Calcium: 8.8 mg/dL (ref 8.6–10.4)
Chloride: 102 mmol/L (ref 98–110)
Creat: 0.84 mg/dL (ref 0.50–0.99)
GFR, Est African American: 86 mL/min/{1.73_m2} (ref >=60)
GFR, Est Non African American: 74 mL/min/{1.73_m2} (ref >=60)
Glucose, Bld: 104 mg/dL — ABNORMAL HIGH (ref 65–99)
Potassium: 4 mmol/L (ref 3.5–5.3)
Sodium: 139 mmol/L (ref 135–146)

## 2020-03-03 LAB — IRON: Iron: 45 ug/dL (ref 45–160)

## 2020-03-03 LAB — LIPID PANEL
Cholesterol: 139 mg/dL (ref <200)
HDL: 41 mg/dL — ABNORMAL LOW (ref >=50)
LDL Cholesterol (Calc): 83 mg/dL (calc)
Non-HDL Cholesterol (Calc): 98 mg/dL (calc) (ref <130)
Total CHOL/HDL Ratio: 3.4 (calc) (ref <5.0)
Triglycerides: 72 mg/dL (ref <150)

## 2020-03-03 LAB — FOLATE: Folate: 8.2 ng/mL

## 2020-03-03 LAB — TSH: TSH: 2.19 mIU/L (ref 0.40–4.50)

## 2020-03-03 LAB — MICROALBUMIN / CREATININE URINE RATIO
Creatinine, Urine: 344 mg/dL — ABNORMAL HIGH (ref 20–275)
Microalb Creat Ratio: 7 mcg/mg creat (ref <30)
Microalb, Ur: 2.5 mg/dL

## 2020-03-03 LAB — VITAMIN B12: Vitamin B-12: 415 pg/mL (ref 200–1100)

## 2020-03-03 LAB — BRAIN NATRIURETIC PEPTIDE: Brain Natriuretic Peptide: 42 pg/mL (ref <100)

## 2020-03-03 LAB — FERRITIN: Ferritin: 26 ng/mL (ref 16–288)

## 2020-03-03 LAB — HIV ANTIBODY (ROUTINE TESTING W REFLEX): HIV 1&2 Ab, 4th Generation: NONREACTIVE

## 2020-03-03 MED ORDER — SYNJARDY 5-500 MG PO TABS: 1.0000 | ORAL_TABLET | Freq: Two times a day (BID) | ORAL | 1 refills | Status: DC

## 2020-03-06 ENCOUNTER — Telehealth (HOSPITAL_COMMUNITY): Payer: Self-pay

## 2020-03-06 NOTE — Telephone Encounter (Signed)
Encounter complete. 

## 2020-03-11 ENCOUNTER — Ambulatory Visit (HOSPITAL_COMMUNITY)
Admission: RE | Admit: 2020-03-11 | Payer: 59 | Source: Ambulatory Visit | Attending: Internal Medicine | Admitting: Internal Medicine

## 2020-03-13 ENCOUNTER — Other Ambulatory Visit: Payer: Self-pay

## 2020-03-13 ENCOUNTER — Ambulatory Visit (HOSPITAL_COMMUNITY)
Admission: RE | Admit: 2020-03-13 | Discharge: 2020-03-13 | Disposition: A | Discharge status: Home / Self Care | Payer: 59 | Source: Ambulatory Visit | Attending: Cardiovascular Disease | Admitting: Cardiovascular Disease

## 2020-03-13 DIAGNOSIS — R9431 Abnormal electrocardiogram [ECG] [EKG]: Secondary | ICD-10-CM | POA: Diagnosis not present

## 2020-03-13 DIAGNOSIS — R0600 Dyspnea, unspecified: Secondary | ICD-10-CM | POA: Diagnosis not present

## 2020-03-13 DIAGNOSIS — R0609 Other forms of dyspnea: Secondary | ICD-10-CM

## 2020-03-13 LAB — MYOCARDIAL PERFUSION IMAGING
LV dias vol: 67 mL (ref 46–106)
LV sys vol: 25 mL
Peak HR: 116 {beats}/min
Rest HR: 76 {beats}/min
SDS: 3
SRS: 3
SSS: 6
TID: 0.93

## 2020-03-13 MED ORDER — TECHNETIUM TC 99M TETROFOSMIN IV KIT
10.8000 | PACK | Freq: Once | INTRAVENOUS | Status: AC | PRN
  Administered 2020-03-13: 10.8 via INTRAVENOUS
  Filled 2020-03-13: qty 11

## 2020-03-13 MED ORDER — REGADENOSON 0.4 MG/5ML IV SOLN
0.4000 mg | Freq: Once | INTRAVENOUS | Status: AC
  Administered 2020-03-13: 0.4 mg via INTRAVENOUS

## 2020-03-13 MED ORDER — TECHNETIUM TC 99M TETROFOSMIN IV KIT
28.7000 | PACK | Freq: Once | INTRAVENOUS | Status: AC | PRN
  Administered 2020-03-13: 28.7 via INTRAVENOUS
  Filled 2020-03-13: qty 29

## 2020-03-15 ENCOUNTER — Other Ambulatory Visit: Payer: Self-pay | Admitting: Internal Medicine

## 2020-03-15 DIAGNOSIS — R9431 Abnormal electrocardiogram [ECG] [EKG]: Secondary | ICD-10-CM

## 2020-03-30 ENCOUNTER — Other Ambulatory Visit: Payer: Self-pay

## 2020-03-30 ENCOUNTER — Ambulatory Visit (INDEPENDENT_AMBULATORY_CARE_PROVIDER_SITE_OTHER): Payer: 59 | Admitting: Internal Medicine

## 2020-03-30 ENCOUNTER — Encounter: Payer: Self-pay | Admitting: Internal Medicine

## 2020-03-30 VITALS — BP 134/84 | HR 77 | Temp 98.5°F | Resp 16 | Ht 66.0 in | Wt 187.0 lb

## 2020-03-30 DIAGNOSIS — I10 Essential (primary) hypertension: Secondary | ICD-10-CM

## 2020-03-30 DIAGNOSIS — Z1231 Encounter for screening mammogram for malignant neoplasm of breast: Secondary | ICD-10-CM

## 2020-03-30 DIAGNOSIS — D649 Anemia, unspecified: Secondary | ICD-10-CM | POA: Diagnosis not present

## 2020-03-30 LAB — CBC WITH DIFFERENTIAL/PLATELET
Basophils Absolute: 0.1 10*3/uL (ref 0.0–0.1)
Basophils Relative: 1.2 % (ref 0.0–3.0)
Eosinophils Absolute: 0.4 10*3/uL (ref 0.0–0.7)
Eosinophils Relative: 4 % (ref 0.0–5.0)
HCT: 34.8 % — ABNORMAL LOW (ref 36.0–46.0)
Hemoglobin: 11.4 g/dL — ABNORMAL LOW (ref 12.0–15.0)
Lymphocytes Relative: 27.5 % (ref 12.0–46.0)
Lymphs Abs: 2.8 10*3/uL (ref 0.7–4.0)
MCHC: 32.9 g/dL (ref 30.0–36.0)
MCV: 85.9 fl (ref 78.0–100.0)
Monocytes Absolute: 0.8 10*3/uL (ref 0.1–1.0)
Monocytes Relative: 8.1 % (ref 3.0–12.0)
Neutro Abs: 6 10*3/uL (ref 1.4–7.7)
Neutrophils Relative %: 59.2 % (ref 43.0–77.0)
Platelets: 246 10*3/uL (ref 150.0–400.0)
RBC: 4.05 Mil/uL (ref 3.87–5.11)
RDW: 16.4 % — ABNORMAL HIGH (ref 11.5–15.5)
WBC: 10.2 10*3/uL (ref 4.0–10.5)

## 2020-03-30 NOTE — Progress Notes (Signed)
Cardiology Office Note:    Date:  04/02/2020   ID:  Jaime [name], DOB [birth year], MRN [id number]  PCP:  Janith Lima, MD  [facility] Cardiologist:  Freada Bergeron, MD  [facility] Electrophysiologist:  None   Referring MD: Hoyt Koch, *   History of Present Illness:    Jaime [name] is a 64 y.o. 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 Sharlet Salina for evaluation of dyspnea on exertion.  Patient states that she has been feeling more SOB when exerting herself especially when walking up a hill or upstairs. Noticed increased diaphoresis as well even after when getting dressed. These symptoms have worsened over the past couple of months and she states she "just feels more wiped out." No chest pain or pressure with exertion. No nausea/vomiting or abdominal pain. Has history of right sided total occlusion of carotid artery but no known CAD.  Patient also states that she is having occasional palpitations when laying down at night. No associated shortness of breath or chest pain. Resolves without intervention.  Has noticed increased daytime fatigue. Can feel like she can sleep all day. Does not snore that she knows of.  Hemoglobin 11.4 on latest blood work. TSH within normal limits.  Stress test 03/13/20:  There was <77mm ST segment depression noted during stress.  The left ventricular ejection fraction is normal (55-65%).  Nuclear stress EF: 62%.  Defect 1: There is a small defect of mild severity present in the apical anterior location.  Findings consistent with ischemia.  This is a low risk study.   1. Reversible perfusion defect in apical anterior wall suggestive of small area of ischemia 2. Low risk study given small area of ischemia.  Would recommend referral to cardiology for further evaluation   Past Medical History:  Diagnosis Date  .  Endometriosis   . GERD (gastroesophageal reflux disease)   . Hypertension   . Meningioma ([place])   . Migraine   . Nontoxic multinodular goiter   . 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 Peptic ulcer disease   . PVD (peripheral vascular disease) ([place])     Past Surgical History:  Procedure Laterality Date  . CT RADIATION THERAPY GUIDE     CT done yearly for menigioma with radiation if needed  . CYST EXCISION     head  . CYST EXCISION Left    x 2   . cyst removed     left wrist  . ORIF ANKLE FRACTURE     right  . OVARIAN CYST REMOVAL     right  . TONSILLECTOMY  1965  . TRIGGER FINGER RELEASE  2012   right thumb  . TUBAL LIGATION    . VIDEO BRONCHOSCOPY  11/10/2011   Procedure: VIDEO BRONCHOSCOPY WITH FLUORO;  Surgeon: Tanda Rockers, MD;  Location: [facility];  Service: Cardiopulmonary;  Laterality: Bilateral;    Current Medications: Current Meds  Medication Sig  . aspirin 325 MG tablet Take 325 mg by mouth daily.   . Empagliflozin-metFORMIN HCl (SYNJARDY) 5-500 MG TABS Take 1 tablet by mouth 2 (two) times daily.  . Multiple Vitamins-Minerals (MULTIVITAMINS THER. W/MINERALS) TABS Take 1 tablet by mouth as needed.   . pantoprazole (PROTONIX) 40 MG tablet TAKE 1 TABLET BY MOUTH EVERY MORNING  . [DISCONTINUED] atorvastatin (LIPITOR) 20 MG  tablet Take 1 tablet (20 mg total) by mouth daily.  . [DISCONTINUED] POTASSIUM PO Take by mouth daily.      Allergies:   Patient has no known allergies.   Social History   Socioeconomic History  . Marital status: Married    Spouse name: Not on file  . Number of children: 1  . Years of education: Not on file  . Highest education level: Not on file  Occupational History  . Occupation: Geophysical data processor     Employer: [employer]    Comment: Retired  Tobacco Use  . Smoking status: Former Smoker    Packs/day: 0.30    Years: 3.00    Pack years: 0.90    Types: Cigarettes    Quit date:  06/20/1981    Years since quitting: 38.8  . Smokeless tobacco: Never Used  Vaping Use  . Vaping Use: Never used  Substance and Sexual Activity  . Alcohol use: No  . Drug use: No  . Sexual activity: Not on file  Other Topics Concern  . Not on file  Social History Narrative   HSG-Pge, 2 years at [employer]  Married 1989 - marriage is ok.  Daughter - 1979, 2 step-sons; 2 grand-children. Retired - from [employer] entry, got "out-sourced." She does keep her grandchildren.             Social Determinants of Health   Financial Resource Strain:   . Difficulty of Paying Living Expenses: Not on file  Food Insecurity:   . Worried About Charity fundraiser in the Last Year: Not on file  . Ran Out of Food in the Last Year: Not on file  Transportation Needs:   . Lack of Transportation (Medical): Not on file  . Lack of Transportation (Non-Medical): Not on file  Physical Activity:   . Days of Exercise per Week: Not on file  . Minutes of Exercise per Session: Not on file  Stress:   . Feeling of Stress : Not on file  Social Connections:   . Frequency of Communication with Friends and Family: Not on file  . Frequency of Social Gatherings with Friends and Family: Not on file  . Attends Religious Services: Not on file  . Active Member of Clubs or Organizations: Not on file  . Attends Archivist Meetings: Not on file  . Marital Status: Not on file     Family History: The patient's family history includes Colon cancer in her maternal grandmother; Dementia in her mother; Diabetes in her mother; Esophageal cancer in her father; Heart disease in her father and mother; Heart failure in her mother; Hypertension in her mother; Lung cancer in her mother. There is no history of Breast cancer or Coronary artery disease.  ROS:   Please see the history of present illness.    The patient denies chest pain, chest pressure, dyspnea at rest, PND, orthopnea, or leg swelling. Denies cough, fever,  chills. Denies nausea, vomiting. Denies syncope or presyncope. Denies dizziness or lightheadedness. Denies snoring.   EKGs/Labs/Other Studies Reviewed:    The following studies were reviewed today: Myoview 02/2020:  There was <60mm ST segment depression noted during stress.  The left ventricular ejection fraction is normal (55-65%).  Nuclear stress EF: 62%.  Defect 1: There is a small defect of mild severity present in the apical anterior location.  Findings consistent with ischemia.  This is a low risk study.   1. Reversible perfusion defect in apical anterior wall  suggestive of small area of ischemia 2. Low risk study given small area of ischemia.  Would recommend referral to cardiology for further evaluation  TTE 09/01/2011: Study Conclusions   - Procedure narrative: Transthoracic echocardiography. Image  quality was poor.  - Left ventricle: The cavity size was normal. Wall thickness  was normal. Systolic function was normal. The estimated  ejection fraction was in the range of 55% to 60%. Left  ventricular diastolic function parameters were normal.  Transthoracic echocardiography. M-mode, complete 2D,  spectral Doppler, and color Doppler. Height: Height:  167.6cm. Height: 66in. Weight: Weight: 88.9kg. Weight:  195.6lb. Body mass index: BMI: 31.6kg/m^2. Body surface  area:  BSA: 1.39m^2. Blood pressure:   109/67. Patient  status: Inpatient. Location: Echo laboratory.   ------------------------------------------------------------   ------------------------------------------------------------  Left ventricle: The cavity size was normal. Wall thickness  was normal. Systolic function was normal. The estimated  ejection fraction was in the range of 55% to 60%. The  transmitral flow pattern was normal. The deceleration time  of the early transmitral flow velocity was normal. The  pulmonary vein flow pattern was normal. The tissue Doppler   parameters were normal. Left ventricular diastolic function  parameters were normal.   ------------------------------------------------------------  Aortic valve:  Structurally normal valve.  Cusp separation  was normal. Doppler: Transvalvular velocity was within the  normal range. There was no stenosis. No regurgitation.   ------------------------------------------------------------  Aorta: The aorta was normal, not dilated, and non-diseased.    ------------------------------------------------------------  Mitral valve:  Structurally normal valve.  Leaflet  separation was normal. Doppler: Transvalvular velocity was  within the normal range. There was no evidence for stenosis.  Trivial regurgitation.   ------------------------------------------------------------  Left atrium: The atrium was normal in size.   ------------------------------------------------------------  Atrial septum: Poorly visualized.   ------------------------------------------------------------  Right ventricle: The cavity size was normal. Wall thickness  was normal. The moderator band was prominent. Systolic  function was normal.   ------------------------------------------------------------  Pulmonic valve:  Poorly visualized. Doppler:  No  significant regurgitation.   ------------------------------------------------------------  Tricuspid valve: Poorly visualized. Structurally normal  valve.  Leaflet separation was normal. Doppler:  Transvalvular velocity was within the normal range. No  regurgitation.   ------------------------------------------------------------  Pulmonary artery:  Poorly visualized.   ------------------------------------------------------------  Right atrium: Poorly visualized.   ------------------------------------------------------------  Pericardium: There was no pericardial effusion.     Myoview 2013: Findings: Multiplanar SPECT imaging shows  no fixed or reversible  perfusion defect within the left ventricular myocardium. Left  jugular cavity size appears grossly normal.   Images obtained with cardiac gating displayed using a surface  rendering algorithm show no segmental wall motion abnormality.   Left ventricular end-diastolic volume is calculated at 51 ml. Left  ventricular end systolic volume is calculated 11 ml. The derived  left ventricular ejection fraction is probably over estimated at  70%.   IMPRESSION:  No evidence for inducible ischemia in the left ventricle.   Normal left ventricular ejection fraction.    Carotid artery doppler Summary:   - Right ICA appears chronically occluded. No significant  left ICA stenosis or plaque noted in the ICA.  - Findings consistent with .   EKG:  EKG is  ordered today.  The ekg ordered today demonstrates NSR with LAD, early r-wave progression  Recent Labs: 03/02/2020: ALT 9; Brain Natriuretic Peptide 42; BUN 11; Creat 0.84; Potassium 4.0; Sodium 139; TSH 2.19 03/30/2020: Hemoglobin 11.4; Platelets 246.0  Recent Lipid Panel    Component Value Date/Time  CHOL 139 03/02/2020 1458   TRIG 72 03/02/2020 1458   TRIG 41 05/05/2006 1206   HDL 41 (L) 03/02/2020 1458   CHOLHDL 3.4 03/02/2020 1458   VLDL 19.6 01/29/2019 1504   LDLCALC 83 03/02/2020 1458      Physical Exam:    VS:  BP 140/88   Pulse 84   Ht 5\' 6"  (1.676 m)   Wt 186 lb (84.4 kg)   SpO2 96%   BMI 30.02 kg/m     Wt Readings from Last 3 Encounters:  04/02/20 186 lb (84.4 kg)  03/30/20 187 lb (84.8 kg)  03/13/20 194 lb (88 kg)     GEN:  Well nourished, well developed in no acute distress HEENT: Normal NECK: No JVD; right carotid bruit. Clear on left LYMPHATICS: No lymphadenopathy CARDIAC: RRR, no murmurs, rubs, gallops RESPIRATORY:  Clear to auscultation without rales, wheezing or rhonchi  ABDOMEN: Soft, non-tender, non-distended MUSCULOSKELETAL:  No edema; No deformity  SKIN: Warm and  dry NEUROLOGIC:  Alert and oriented x 3 PSYCHIATRIC:  Normal affect   ASSESSMENT:    1. DOE (dyspnea on exertion)   2. EKG, abnormal   3. Medication management   4. Occlusion of right carotid artery   5. Essential hypertension   6. Pure hypercholesterolemia   7. Type II diabetes mellitus with manifestations ([place])    PLAN:    In order of problems listed above:  #Dyspnea on Exertion: #Positive stress test Patient with progressive dyspnea on exertion and fatigue. No associated chest pressure/pain, nausea or vomiting. Has noted some increased diaphoresis. Myoview ordered by PCP demonstrated small area of anterior ischemia. Will proceed with medical management of suspected CAD. -Continue ASA 325mg  (on higher dose due to carotid artery occlusion) -Increase atorvastatin to 40mg  daily; goal LDL <70 -Check TTE; last in 2013 with normal BiV function, no LVH, no significant valve disease -Start metop 25mg  XL -Start losartan 50mg  ; repeat BMP in one week to check Cr/K -Stop potassium supplement while on losartan  #Carotid artery disease: Carotid ultrasound 2013 with occluded right carotid artery. -Continue ASA 325mg  as prescribed by Neurology -Increase atorvastatin to 40mg  as above  #Hypertension: Needs aggressive blood pressure control with goal BP <120s/80s. -Start losartan 50mg  as above -Start metoprolol 25mg  as above -Monitor blood pressures over next 5 days and send me the log; will adjust medications as needed  #HLD: -Increase atorvastatin to 40mg  daily; goal LDL <70 -Repeat cholesterol panel with PCP in 3 months  #DMII: Managed by PCP. -Continue Synjardy  Medication Adjustments/Labs and Tests Ordered: Current medicines are reviewed at length with the patient today.  Concerns regarding medicines are outlined above.  Orders Placed This Encounter  Procedures  . Basic Metabolic Panel (BMET)  . EKG 12-Lead  . ECHOCARDIOGRAM COMPLETE   Meds ordered this encounter   Medications  . metoprolol succinate (TOPROL XL) 25 MG 24 hr tablet    Sig: Take 1 tablet (25 mg total) by mouth daily after supper.    Dispense:  90 tablet    Refill:  3  . atorvastatin (LIPITOR) 40 MG tablet    Sig: Take 1 tablet (40 mg total) by mouth daily.    Dispense:  90 tablet    Refill:  3    Dose increase  . losartan (COZAAR) 50 MG tablet    Sig: Take 1 tablet (50 mg total) by mouth daily.    Dispense:  90 tablet    Refill:  3    Patient  Instructions  Medication Instructions:  Your physician has recommended you make the following change in your medication: Start Metoprolol Succinate 25 mg by mouth daily at night. Start Losartan 50 mg by mouth daily Increase Atorvastatin to 40 mg by mouth daily. Stop Potassium. Start over the counter iron, vitamin C.  May take over the counter magnesium as needed.   *If you need a refill on your cardiac medications before your next appointment, please call your pharmacy*   Lab Work: Your physician recommends that you return for lab work in: one week.  October 21,2021-  The lab is open from 7:30 AM to 4:15.    If you have labs (blood work) drawn today and your tests are completely normal, you will receive your results only by: Marland Kitchen MyChart Message (if you have MyChart) OR . A paper copy in the mail If you have any lab test that is abnormal or we need to change your treatment, we will call you to review the results.   Testing/Procedures: Your physician has requested that you have an echocardiogram. Echocardiography is a painless test that uses sound waves to create images of your heart. It provides your doctor with information about the size and shape of your heart and how well your heart's chambers and valves are working. This procedure takes approximately one hour. There are no restrictions for this procedure.     Follow-Up: At [facility], you and your health needs are our priority.  As part of our continuing mission to provide  you with exceptional heart care, we have created designated Provider Care Teams.  These Care Teams include your primary Cardiologist (physician) and Advanced Practice Providers (APPs -  Physician Assistants and Nurse Practitioners) who all work together to provide you with the care you need, when you need it.  We recommend signing up for the patient portal called "MyChart".  Sign up information is provided on this After Visit Summary.  MyChart is used to connect with patients for Virtual Visits (Telemedicine).  Patients are able to view lab/test results, encounter notes, upcoming appointments, etc.  Non-urgent messages can be sent to your provider as well.   To learn more about what you can do with MyChart, go to [email or web address].    Your next appointment:   6 month(s)  The format for your next appointment:   In Person  Provider:   You may see Dr Johney Frame or one of the following Advanced Practice Providers on your designated Care Team:    Richardson Dopp, PA-C  Robbie Lis, [place]    Other Instructions      Signed, Freada Bergeron, MD  04/02/2020 10:07 AM    [place]

## 2020-03-30 NOTE — Progress Notes (Signed)
Subjective:  Patient ID: Jaime [name], female    DOB: May 14, 1956  Age: 64 y.o. MRN: [id number]  CC: Hypertension, Diabetes, and Anemia  This visit occurred during the SARS-CoV-2 public health emergency.  Safety protocols were in place, including screening questions prior to the visit, additional usage of staff PPE, and extensive cleaning of exam room while observing appropriate contact time as indicated for disinfecting solutions.    HPI Jaime [name] presents for f/up - She continues to complain of dyspnea on exertion.  Her recent labs remarkable for a mild anemia.  Testing for vitamin deficiencies was negative.  She has an appointment with cardiology later this week.  Outpatient Medications Prior to Visit  Medication Sig Dispense Refill  . aspirin 325 MG tablet Take 325 mg by mouth daily.     . Empagliflozin-metFORMIN HCl (SYNJARDY) 5-500 MG TABS Take 1 tablet by mouth 2 (two) times daily. 180 tablet 1  . Multiple Vitamins-Minerals (MULTIVITAMINS THER. W/MINERALS) TABS Take 1 tablet by mouth as needed.     . pantoprazole (PROTONIX) 40 MG tablet TAKE 1 TABLET BY MOUTH EVERY MORNING 30 tablet 5  . atorvastatin (LIPITOR) 20 MG tablet Take 1 tablet (20 mg total) by mouth daily. 90 tablet 1  . POTASSIUM PO Take by mouth daily.     . nebivolol (BYSTOLIC) 10 MG tablet Take 1 tablet (10 mg total) by mouth daily. 90 tablet 0   No facility-administered medications prior to visit.    ROS Review of Systems  Constitutional: Negative for diaphoresis and fatigue.  HENT: Negative.   Eyes: Negative.   Respiratory: Positive for shortness of breath (DOE). Negative for cough, chest tightness and wheezing.   Cardiovascular: Negative for chest pain, palpitations and leg swelling.  Gastrointestinal: Negative for abdominal pain, blood in stool, diarrhea, nausea and vomiting.  Endocrine: Negative.   Genitourinary: Negative.  Negative for difficulty urinating.  Musculoskeletal: Negative.  Negative  for arthralgias and myalgias.  Skin: Negative.  Negative for color change and pallor.  Neurological: Negative.  Negative for dizziness, weakness and light-headedness.  Hematological: Negative for adenopathy. Does not bruise/bleed easily.  Psychiatric/Behavioral: Negative.     Objective:  BP 134/84   Pulse 77   Temp 98.5 F (36.9 C) (Oral)   Resp 16   Ht 5\' 6"  (1.676 m)   Wt 187 lb (84.8 kg)   SpO2 98%   BMI 30.18 kg/m   BP Readings from Last 3 Encounters:  04/02/20 140/88  03/30/20 134/84  03/02/20 (!) 146/84    Wt Readings from Last 3 Encounters:  04/02/20 186 lb (84.4 kg)  03/30/20 187 lb (84.8 kg)  03/13/20 194 lb (88 kg)    Physical Exam Vitals reviewed.  HENT:     Nose: Nose normal.     Mouth/Throat:     Mouth: Mucous membranes are moist.  Eyes:     General: No scleral icterus.    Conjunctiva/sclera: Conjunctivae normal.  Cardiovascular:     Rate and Rhythm: Normal rate and regular rhythm.     Heart sounds: No murmur heard.   Pulmonary:     Effort: Pulmonary effort is normal.     Breath sounds: No stridor. No wheezing, rhonchi or rales.  Abdominal:     General: Abdomen is flat. There is no distension.     Palpations: There is no mass.     Tenderness: There is no abdominal tenderness.  Musculoskeletal:        General: Normal range of  motion.     Cervical back: Neck supple.     Right lower leg: No edema.     Left lower leg: No edema.  Lymphadenopathy:     Cervical: No cervical adenopathy.  Skin:    General: Skin is warm and dry.     Coloration: Skin is not pale.  Neurological:     General: No focal deficit present.     Mental Status: She is alert.  Psychiatric:        Mood and Affect: Mood normal.        Behavior: Behavior normal.     Lab Results  Component Value Date   WBC 10.2 03/30/2020   HGB 11.4 (L) 03/30/2020   HCT 34.8 (L) 03/30/2020   PLT 246.0 03/30/2020   GLUCOSE 104 (H) 03/02/2020   CHOL 139 03/02/2020   TRIG 72 03/02/2020    HDL 41 (L) 03/02/2020   LDLCALC 83 03/02/2020   ALT 9 03/02/2020   AST 17 03/02/2020   NA 139 03/02/2020   K 4.0 03/02/2020   CL 102 03/02/2020   CREATININE 0.84 03/02/2020   BUN 11 03/02/2020   CO2 30 03/02/2020   TSH 2.19 03/02/2020   HGBA1C 6.7 (H) 03/02/2020   MICROALBUR 2.5 03/02/2020    MYOCARDIAL PERFUSION IMAGING  Result Date: 03/13/2020  There was <28mm ST segment depression noted during stress.  The left ventricular ejection fraction is normal (55-65%).  Nuclear stress EF: 62%.  Defect 1: There is a small defect of mild severity present in the apical anterior location.  Findings consistent with ischemia.  This is a low risk study.  1. Reversible perfusion defect in apical anterior wall suggestive of small area of ischemia 2. Low risk study given small area of ischemia.  Would recommend referral to cardiology for further evaluation    Assessment & Plan:   Jaime [name] was seen today for hypertension, diabetes and anemia.  Diagnoses and all orders for this visit:  Normocytic anemia- Her H&H have improved slightly.  Screening for vitamin deficiencies was unremarkable.  This is likely anemia of chronic disease.  I do not think this contributes to her DOE.  We will continue to monitor this. -     CBC with Differential/Platelet; Future -     CBC with Differential/Platelet  Essential hypertension- She will see cardiology later this week and will have her antihypertensives adjusted if needed. -     CBC with Differential/Platelet; Future -     CBC with Differential/Platelet  Visit for screening mammogram -     MM DIGITAL SCREENING BILATERAL; Future   I have discontinued Jaime [name]. Jaime [name] nebivolol. I am also having her maintain her multivitamins ther. w/minerals, aspirin, pantoprazole, and Synjardy.  No orders of the defined types were placed in this encounter.    Follow-up: Return in about 4 months (around 07/31/2020).  Scarlette Calico, MD

## 2020-03-30 NOTE — Patient Instructions (Signed)
Type 2 Diabetes Mellitus, Diagnosis, Adult Type 2 diabetes (type 2 diabetes mellitus) is a long-term (chronic) disease. In type 2 diabetes, one or both of these problems may be present:  The pancreas does not make enough of a hormone called insulin.  Cells in the body do not respond properly to insulin that the body makes (insulin resistance). Normally, insulin allows blood sugar (glucose) to enter cells in the body. The cells use glucose for energy. Insulin resistance or lack of insulin causes excess glucose to build up in the blood instead of going into cells. As a result, high blood glucose (hyperglycemia) develops. What increases the risk? The following factors may make you more likely to develop type 2 diabetes:  Having a family member with type 2 diabetes.  Being overweight or obese.  Having an inactive (sedentary) lifestyle.  Having been diagnosed with insulin resistance.  Having a history of prediabetes, gestational diabetes, or polycystic ovary syndrome (PCOS).  Being of American-Indian, African-American, Hispanic/Latino, or Asian/Pacific Islander descent. What are the signs or symptoms? In the early stage of this condition, you may not have symptoms. Symptoms develop slowly and may include:  Increased thirst (polydipsia).  Increased hunger(polyphagia).  Increased urination (polyuria).  Increased urination during the night (nocturia).  Unexplained weight loss.  Frequent infections that keep coming back (recurring).  Fatigue.  Weakness.  Vision changes, such as blurry vision.  Cuts or bruises that are slow to heal.  Tingling or numbness in the hands or feet.  Dark patches on the skin (acanthosis nigricans). How is this diagnosed? This condition is diagnosed based on your symptoms, your medical history, a physical exam, and your blood glucose level. Your blood glucose may be checked with one or more of the following blood tests:  A fasting blood glucose (FBG)  test. You will not be allowed to eat (you will fast) for 8 hours or longer before a blood sample is taken.  A random blood glucose test. This test checks blood glucose at any time of day regardless of when you ate.  An A1c (hemoglobin A1c) blood test. This test provides information about blood glucose control over the previous 2-3 months.  An oral glucose tolerance test (OGTT). This test measures your blood glucose at two times: ? After fasting. This is your baseline blood glucose level. ? Two hours after drinking a beverage that contains glucose. You may be diagnosed with type 2 diabetes if:  Your FBG level is 126 mg/dL (7.0 mmol/L) or higher.  Your random blood glucose level is 200 mg/dL (11.1 mmol/L) or higher.  Your A1c level is 6.5% or higher.  Your OGTT result is higher than 200 mg/dL (11.1 mmol/L). These blood tests may be repeated to confirm your diagnosis. How is this treated? Your treatment may be managed by a specialist called an endocrinologist. Type 2 diabetes may be treated by following instructions from your health care provider about:  Making diet and lifestyle changes. This may include: ? Following an individualized nutrition plan that is developed by a diet and nutrition specialist (registered dietitian). ? Exercising regularly. ? Finding ways to manage stress.  Checking your blood glucose level as often as told.  Taking diabetes medicines or insulin daily. This helps to keep your blood glucose levels in the healthy range. ? If you use insulin, you may need to adjust the dosage depending on how physically active you are and what foods you eat. Your health care provider will tell you how to adjust your dosage.    Taking medicines to help prevent complications from diabetes, such as: ? Aspirin. ? Medicine to lower cholesterol. ? Medicine to control blood pressure. Your health care provider will set individualized treatment goals for you. Your goals will be based on  your age, other medical conditions you have, and how you respond to diabetes treatment. Generally, the goal of treatment is to maintain the following blood glucose levels:  Before meals (preprandial): 80-130 mg/dL (4.4-7.2 mmol/L).  After meals (postprandial): below 180 mg/dL (10 mmol/L).  A1c level: less than 7%. Follow these instructions at home: Questions to ask your health care provider  Consider asking the following questions: ? Do I need to meet with a diabetes educator? ? Where can I find a support group for people with diabetes? ? What equipment will I need to manage my diabetes at home? ? What diabetes medicines do I need, and when should I take them? ? How often do I need to check my blood glucose? ? What number can I call if I have questions? ? When is my next appointment? General instructions  Take over-the-counter and prescription medicines only as told by your health care provider.  Keep all follow-up visits as told by your health care provider. This is important.  For more information about diabetes, visit: ? American Diabetes Association (ADA): www.diabetes.org ? American Association of Diabetes Educators (AADE): www.diabeteseducator.org Contact a health care provider if:  Your blood glucose is at or above 240 mg/dL (13.3 mmol/L) for 2 days in a row.  You have been sick or have had a fever for 2 days or longer, and you are not getting better.  You have any of the following problems for more than 6 hours: ? You cannot eat or drink. ? You have nausea and vomiting. ? You have diarrhea. Get help right away if:  Your blood glucose is lower than 54 mg/dL (3.0 mmol/L).  You become confused or you have trouble thinking clearly.  You have difficulty breathing.  You have moderate or large ketone levels in your urine. Summary  Type 2 diabetes (type 2 diabetes mellitus) is a long-term (chronic) disease. In type 2 diabetes, the pancreas does not make enough of a  hormone called insulin, or cells in the body do not respond properly to insulin that the body makes (insulin resistance).  This condition is treated by making diet and lifestyle changes and taking diabetes medicines or insulin.  Your health care provider will set individualized treatment goals for you. Your goals will be based on your age, other medical conditions you have, and how you respond to diabetes treatment.  Keep all follow-up visits as told by your health care provider. This is important. This information is not intended to replace advice given to you by your health care provider. Make sure you discuss any questions you have with your health care provider. Document Revised: 08/04/2017 Document Reviewed: 07/10/2015 Elsevier Patient Education  2020 Elsevier Inc.  

## 2020-04-02 ENCOUNTER — Ambulatory Visit (INDEPENDENT_AMBULATORY_CARE_PROVIDER_SITE_OTHER): Payer: 59 | Admitting: Cardiology

## 2020-04-02 ENCOUNTER — Other Ambulatory Visit: Payer: Self-pay

## 2020-04-02 ENCOUNTER — Encounter: Payer: Self-pay | Admitting: Cardiology

## 2020-04-02 VITALS — BP 140/88 | HR 84 | Ht 66.0 in | Wt 186.0 lb

## 2020-04-02 DIAGNOSIS — Z79899 Other long term (current) drug therapy: Secondary | ICD-10-CM

## 2020-04-02 DIAGNOSIS — R9431 Abnormal electrocardiogram [ECG] [EKG]: Secondary | ICD-10-CM

## 2020-04-02 DIAGNOSIS — R0600 Dyspnea, unspecified: Secondary | ICD-10-CM

## 2020-04-02 DIAGNOSIS — E118 Type 2 diabetes mellitus with unspecified complications: Secondary | ICD-10-CM

## 2020-04-02 DIAGNOSIS — I10 Essential (primary) hypertension: Secondary | ICD-10-CM

## 2020-04-02 DIAGNOSIS — R0609 Other forms of dyspnea: Secondary | ICD-10-CM

## 2020-04-02 DIAGNOSIS — E7800 Pure hypercholesterolemia, unspecified: Secondary | ICD-10-CM

## 2020-04-02 DIAGNOSIS — I6521 Occlusion and stenosis of right carotid artery: Secondary | ICD-10-CM

## 2020-04-02 MED ORDER — METOPROLOL SUCCINATE ER 25 MG PO TB24: 25.0000 mg | ORAL_TABLET | Freq: Every day | ORAL | 3 refills | Status: DC

## 2020-04-02 MED ORDER — ATORVASTATIN CALCIUM 40 MG PO TABS: 40.0000 mg | ORAL_TABLET | Freq: Every day | ORAL | 3 refills | Status: DC

## 2020-04-02 MED ORDER — LOSARTAN POTASSIUM 50 MG PO TABS: 50.0000 mg | ORAL_TABLET | Freq: Every day | ORAL | 3 refills | Status: DC

## 2020-04-02 NOTE — Patient Instructions (Signed)
Medication Instructions:  Your physician has recommended you make the following change in your medication: Start Metoprolol Succinate 25 mg by mouth daily at night. Start Losartan 50 mg by mouth daily Increase Atorvastatin to 40 mg by mouth daily. Stop Potassium. Start over the counter iron, vitamin C.  May take over the counter magnesium as needed.   *If you need a refill on your cardiac medications before your next appointment, please call your pharmacy*   Lab Work: Your physician recommends that you return for lab work in: one week.  October 21,2021-  The lab is open from 7:30 AM to 4:15.    If you have labs (blood work) drawn today and your tests are completely normal, you will receive your results only by: Marland Kitchen MyChart Message (if you have MyChart) OR . A paper copy in the mail If you have any lab test that is abnormal or we need to change your treatment, we will call you to review the results.   Testing/Procedures: Your physician has requested that you have an echocardiogram. Echocardiography is a painless test that uses sound waves to create images of your heart. It provides your doctor with information about the size and shape of your heart and how well your heart's chambers and valves are working. This procedure takes approximately one hour. There are no restrictions for this procedure.     Follow-Up: At [facility], you and your health needs are our priority.  As part of our continuing mission to provide you with exceptional heart care, we have created designated Provider Care Teams.  These Care Teams include your primary Cardiologist (physician) and Advanced Practice Providers (APPs -  Physician Assistants and Nurse Practitioners) who all work together to provide you with the care you need, when you need it.  We recommend signing up for the patient portal called "MyChart".  Sign up information is provided on this After Visit Summary.  MyChart is used to connect with patients  for Virtual Visits (Telemedicine).  Patients are able to view lab/test results, encounter notes, upcoming appointments, etc.  Non-urgent messages can be sent to your provider as well.   To learn more about what you can do with MyChart, go to [email or web address].    Your next appointment:   6 month(s)  The format for your next appointment:   In Person  Provider:   You may see Dr Johney Frame or one of the following Advanced Practice Providers on your designated Care Team:    Richardson Dopp, PA-C  Robbie Lis, [place]    Other Instructions

## 2020-04-08 ENCOUNTER — Other Ambulatory Visit: Payer: Self-pay | Admitting: Cardiology

## 2020-04-08 MED ORDER — LOSARTAN POTASSIUM 100 MG PO TABS: 100.0000 mg | ORAL_TABLET | Freq: Every day | ORAL | 3 refills | Status: DC

## 2020-04-09 ENCOUNTER — Other Ambulatory Visit: Payer: 59 | Admitting: *Deleted

## 2020-04-09 ENCOUNTER — Other Ambulatory Visit: Payer: Self-pay

## 2020-04-09 DIAGNOSIS — R9431 Abnormal electrocardiogram [ECG] [EKG]: Secondary | ICD-10-CM

## 2020-04-09 DIAGNOSIS — R0609 Other forms of dyspnea: Secondary | ICD-10-CM

## 2020-04-09 DIAGNOSIS — R0600 Dyspnea, unspecified: Secondary | ICD-10-CM

## 2020-04-09 DIAGNOSIS — Z79899 Other long term (current) drug therapy: Secondary | ICD-10-CM

## 2020-04-10 LAB — BASIC METABOLIC PANEL
BUN/Creatinine Ratio: 9 — ABNORMAL LOW (ref 12–28)
BUN: 8 mg/dL (ref 8–27)
CO2: 25 mmol/L (ref 20–29)
Calcium: 9.4 mg/dL (ref 8.7–10.3)
Chloride: 102 mmol/L (ref 96–106)
Creatinine, Ser: 0.89 mg/dL (ref 0.57–1.00)
GFR calc Af Amer: 80 mL/min/{1.73_m2} (ref >59)
GFR calc non Af Amer: 69 mL/min/{1.73_m2} (ref >59)
Glucose: 110 mg/dL — ABNORMAL HIGH (ref 65–99)
Potassium: 3.6 mmol/L (ref 3.5–5.2)
Sodium: 143 mmol/L (ref 134–144)

## 2020-04-17 ENCOUNTER — Other Ambulatory Visit: Payer: Self-pay | Admitting: Cardiology

## 2020-04-17 MED ORDER — AMLODIPINE BESYLATE 5 MG PO TABS: 5.0000 mg | ORAL_TABLET | Freq: Every day | ORAL | 3 refills | Status: DC

## 2020-04-21 ENCOUNTER — Other Ambulatory Visit: Payer: Self-pay

## 2020-04-21 ENCOUNTER — Ambulatory Visit (HOSPITAL_COMMUNITY): Payer: 59 | Attending: Cardiovascular Disease

## 2020-04-21 DIAGNOSIS — R0600 Dyspnea, unspecified: Secondary | ICD-10-CM | POA: Diagnosis not present

## 2020-04-21 DIAGNOSIS — R9431 Abnormal electrocardiogram [ECG] [EKG]: Secondary | ICD-10-CM | POA: Diagnosis not present

## 2020-04-21 DIAGNOSIS — R0609 Other forms of dyspnea: Secondary | ICD-10-CM

## 2020-04-21 LAB — ECHOCARDIOGRAM COMPLETE
Area-P 1/2: 3.12 cm2
S' Lateral: 2.7 cm

## 2020-05-05 ENCOUNTER — Other Ambulatory Visit: Payer: Self-pay | Admitting: Internal Medicine

## 2020-05-07 NOTE — Telephone Encounter (Signed)
   Patient calling for status of refill for Protonix

## 2020-05-08 ENCOUNTER — Telehealth: Payer: Self-pay | Admitting: Internal Medicine

## 2020-05-08 NOTE — Telephone Encounter (Signed)
pantoprazole (PROTONIX) 40 MG tablet Patient states pharmacy told her she needs a new prescription for this written so she can get the medication.  [id number] [address], [place] Phone:  [phone]  Fax:  [phone]

## 2020-05-10 ENCOUNTER — Other Ambulatory Visit: Payer: Self-pay | Admitting: Internal Medicine

## 2020-05-10 DIAGNOSIS — K219 Gastro-esophageal reflux disease without esophagitis: Secondary | ICD-10-CM

## 2020-05-10 MED ORDER — PANTOPRAZOLE SODIUM 40 MG PO TBEC: 40.0000 mg | DELAYED_RELEASE_TABLET | Freq: Every morning | ORAL | 1 refills | Status: DC

## 2020-07-02 ENCOUNTER — Other Ambulatory Visit: Payer: Self-pay

## 2020-07-02 ENCOUNTER — Ambulatory Visit (INDEPENDENT_AMBULATORY_CARE_PROVIDER_SITE_OTHER): Payer: 59 | Admitting: Obstetrics and Gynecology

## 2020-07-02 ENCOUNTER — Encounter: Payer: Self-pay | Admitting: Obstetrics and Gynecology

## 2020-07-02 ENCOUNTER — Other Ambulatory Visit (HOSPITAL_COMMUNITY)
Admission: RE | Admit: 2020-07-02 | Discharge: 2020-07-02 | Disposition: A | Discharge status: Home / Self Care | Payer: 59 | Source: Ambulatory Visit | Attending: Obstetrics and Gynecology | Admitting: Obstetrics and Gynecology

## 2020-07-02 VITALS — BP 116/70 | HR 76 | Resp 18 | Ht 66.0 in | Wt 176.0 lb

## 2020-07-02 DIAGNOSIS — Z124 Encounter for screening for malignant neoplasm of cervix: Secondary | ICD-10-CM | POA: Diagnosis not present

## 2020-07-02 DIAGNOSIS — Z01419 Encounter for gynecological examination (general) (routine) without abnormal findings: Secondary | ICD-10-CM | POA: Diagnosis not present

## 2020-07-02 LAB — HM PAP SMEAR

## 2020-07-02 NOTE — Patient Instructions (Signed)

## 2020-07-02 NOTE — Progress Notes (Signed)
65 y.o. G1 on file. Married Black or [place] American Not Hispanic or Latino female here as a new patient for an annual exam. Referred from PCP Janith Lima, MD. No vaginal bleeding. Not currently sexually active. No bowel or bladder c/o.     No LMP recorded. Patient is postmenopausal.          Sexually active: No.  The current method of family planning is post menopausal status.    Exercising: Yes.    walking Smoker:  Former, quit [place] Maintenance: Pap:  Unsure -- years ago History of abnormal Pap:  no MMG:  11/07/17 BIRADS 1 negative/density b BMD:   never Colonoscopy: 04/02/13 f/u 10 years TDaP:  04/04/17  Gardasil: n/a   reports that she quit smoking about 39 years ago. Her smoking use included cigarettes. She has a 0.90 pack-year smoking history. She has never used smokeless tobacco. She reports that she does not drink alcohol and does not use drugs. Occasional ETOH. She is living with and taking care of her mother full time, mom is 30. Mom has dementia. 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 She has a daughter and a grandson (16) and 2 step sons.   Past Medical History:  Diagnosis Date  . Endometriosis   . GERD (gastroesophageal reflux disease)   . Hypertension   . Meningioma ([place])   . Migraine   . Nontoxic multinodular goiter   . 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 Peptic ulcer disease   . PVD (peripheral vascular disease) ([place])     Past Surgical History:  Procedure Laterality Date  . CT RADIATION THERAPY GUIDE     CT done yearly for menigioma with radiation if needed  . CYST EXCISION     head  . CYST EXCISION Left    x 2   . cyst removed     left wrist  . ORIF ANKLE FRACTURE     right  . OVARIAN CYST REMOVAL     right  . SHOULDER SURGERY Right    Spur removal  . TONSILLECTOMY  1965  . TRIGGER FINGER RELEASE  2012   right thumb  . TUBAL LIGATION    . VIDEO BRONCHOSCOPY  11/10/2011   Procedure: VIDEO  BRONCHOSCOPY WITH FLUORO;  Surgeon: Tanda Rockers, MD;  Location: [facility];  Service: Cardiopulmonary;  Laterality: Bilateral;    Current Outpatient Medications  Medication Sig Dispense Refill  . amLODipine (NORVASC) 5 MG tablet Take 1 tablet (5 mg total) by mouth daily. 180 tablet 3  . aspirin 325 MG tablet Take 325 mg by mouth daily.     Marland Kitchen atorvastatin (LIPITOR) 40 MG tablet Take 1 tablet (40 mg total) by mouth daily. 90 tablet 3  . Empagliflozin-metFORMIN HCl (SYNJARDY) 5-500 MG TABS Take 1 tablet by mouth 2 (two) times daily. 180 tablet 1  . IRON-VITAMIN C PO     . losartan (COZAAR) 100 MG tablet Take 1 tablet (100 mg total) by mouth daily. 90 tablet 3  . metoprolol succinate (TOPROL XL) 25 MG 24 hr tablet Take 1 tablet (25 mg total) by mouth daily after supper. 90 tablet 3  . Multiple Vitamins-Minerals (MULTIVITAMINS THER. W/MINERALS) TABS Take 1 tablet by mouth as needed.     . pantoprazole (PROTONIX) 40 MG tablet Take 1 tablet (40 mg total) by mouth every morning. 90 tablet 1  . Magnesium 400 MG TABS  (Patient not taking: Reported on  07/02/2020)     No current facility-administered medications for this visit.    Family History  Problem Relation Age of Onset  . Hypertension Mother   . Diabetes Mother   . Heart disease Mother   . Heart failure Mother   . Dementia Mother   . Lung cancer Mother   . Esophageal cancer Father        was a smoker  . Colon cancer Maternal Grandmother   . Breast cancer Neg Hx   . Coronary artery disease Neg Hx     Review of Systems  Constitutional: Negative.   HENT: Negative.   Eyes: Negative.   Respiratory: Negative.   Cardiovascular: Negative.   Gastrointestinal: Negative.   Endocrine: Negative.   Genitourinary: Negative.   Musculoskeletal: Negative.   Skin: Negative.   Allergic/Immunologic: Negative.   Neurological: Negative.   Hematological: Negative.   Psychiatric/Behavioral: Negative.     Exam:   BP 116/70 (BP Location:  Right Arm, Patient Position: Sitting, Cuff Size: Large)   Pulse 76   Resp 18   Ht 5\' 6"  (1.676 m)   Wt 176 lb (79.8 kg)   BMI 28.41 kg/m   Weight change: @WEIGHTCHANGE @ Height:   Height: 5\' 6"  (167.6 cm)  Ht Readings from Last 3 Encounters:  07/02/20 5\' 6"  (1.676 m)  04/02/20 5\' 6"  (1.676 m)  03/30/20 5\' 6"  (1.676 m)    General appearance: alert, cooperative and appears stated age Head: Normocephalic, without obvious abnormality, atraumatic Neck: no adenopathy, supple, symmetrical, trachea midline and thyroid normal to inspection and palpation Lungs: clear to auscultation bilaterally Cardiovascular: regular rate and rhythm Breasts: normal appearance, no masses or tenderness, left nipple inverted (per patient no change) Abdomen: soft, non-tender; non distended,  no masses,  no organomegaly Extremities: extremities normal, atraumatic, no cyanosis or edema Skin: Skin color, texture, turgor normal. No rashes or lesions Lymph nodes: Cervical, supraclavicular, and axillary nodes normal. No abnormal inguinal nodes palpated Neurologic: Grossly normal   Pelvic: External genitalia:  no lesions              Urethra:  normal appearing urethra with no masses, tenderness or lesions              Bartholins and Skenes: normal                 Vagina: normal appearing vagina with normal color and discharge, no lesions              Cervix: no lesions               Bimanual Exam:  Uterus:  normal size, contour, position, consistency, mobility, non-tender              Adnexa: no mass, fullness, tenderness               Rectovaginal: Confirms               Anus:  normal sphincter tone, no lesions  Terence Lux chaperoned for the exam.  1. Well woman exam Discussed breast self exam Discussed calcium and vit D intake Mammogram due, she will schedule Colonoscopy is UTD Labs with primary MD   2. Screening for cervical cancer  - Cytology - PAP

## 2020-07-07 LAB — CYTOLOGY - PAP
Comment: NEGATIVE
Diagnosis: NEGATIVE
High risk HPV: NEGATIVE

## 2020-07-17 ENCOUNTER — Other Ambulatory Visit: Payer: Self-pay

## 2020-07-17 ENCOUNTER — Ambulatory Visit
Admission: RE | Admit: 2020-07-17 | Discharge: 2020-07-17 | Disposition: A | Discharge status: Home / Self Care | Payer: 59 | Source: Ambulatory Visit | Attending: Internal Medicine | Admitting: Internal Medicine

## 2020-07-17 DIAGNOSIS — Z1231 Encounter for screening mammogram for malignant neoplasm of breast: Secondary | ICD-10-CM

## 2020-07-20 LAB — HM MAMMOGRAPHY

## 2020-07-27 ENCOUNTER — Encounter: Payer: Self-pay | Admitting: Internal Medicine

## 2020-07-27 ENCOUNTER — Other Ambulatory Visit: Payer: Self-pay

## 2020-07-27 ENCOUNTER — Ambulatory Visit (INDEPENDENT_AMBULATORY_CARE_PROVIDER_SITE_OTHER): Payer: 59 | Admitting: Internal Medicine

## 2020-07-27 VITALS — BP 124/86 | HR 76 | Temp 98.1°F | Resp 16 | Ht 66.0 in | Wt 177.0 lb

## 2020-07-27 DIAGNOSIS — D539 Nutritional anemia, unspecified: Secondary | ICD-10-CM

## 2020-07-27 DIAGNOSIS — I5189 Other ill-defined heart diseases: Secondary | ICD-10-CM | POA: Diagnosis not present

## 2020-07-27 DIAGNOSIS — I10 Essential (primary) hypertension: Secondary | ICD-10-CM

## 2020-07-27 DIAGNOSIS — E118 Type 2 diabetes mellitus with unspecified complications: Secondary | ICD-10-CM

## 2020-07-27 LAB — CBC WITH DIFFERENTIAL/PLATELET
Basophils Absolute: 0.1 10*3/uL (ref 0.0–0.1)
Basophils Relative: 0.9 % (ref 0.0–3.0)
Eosinophils Absolute: 0.3 10*3/uL (ref 0.0–0.7)
Eosinophils Relative: 3.8 % (ref 0.0–5.0)
HCT: 32.7 % — ABNORMAL LOW (ref 36.0–46.0)
Hemoglobin: 10.7 g/dL — ABNORMAL LOW (ref 12.0–15.0)
Lymphocytes Relative: 26 % (ref 12.0–46.0)
Lymphs Abs: 2.4 10*3/uL (ref 0.7–4.0)
MCHC: 32.7 g/dL (ref 30.0–36.0)
MCV: 83.8 fl (ref 78.0–100.0)
Monocytes Absolute: 0.7 10*3/uL (ref 0.1–1.0)
Monocytes Relative: 7.1 % (ref 3.0–12.0)
Neutro Abs: 5.7 10*3/uL (ref 1.4–7.7)
Neutrophils Relative %: 62.2 % (ref 43.0–77.0)
Platelets: 242 10*3/uL (ref 150.0–400.0)
RBC: 3.91 Mil/uL (ref 3.87–5.11)
RDW: 17.2 % — ABNORMAL HIGH (ref 11.5–15.5)
WBC: 9.2 10*3/uL (ref 4.0–10.5)

## 2020-07-27 LAB — BASIC METABOLIC PANEL
BUN: 7 mg/dL (ref 6–23)
CO2: 27 mEq/L (ref 19–32)
Calcium: 9 mg/dL (ref 8.4–10.5)
Chloride: 106 mEq/L (ref 96–112)
Creatinine, Ser: 0.84 mg/dL (ref 0.40–1.20)
GFR: 73.7 mL/min (ref >60.00)
Glucose, Bld: 112 mg/dL — ABNORMAL HIGH (ref 70–99)
Potassium: 3.5 mEq/L (ref 3.5–5.1)
Sodium: 140 mEq/L (ref 135–145)

## 2020-07-27 LAB — HEMOGLOBIN A1C: Hgb A1c MFr Bld: 6.4 % (ref 4.6–6.5)

## 2020-07-27 NOTE — Progress Notes (Signed)
Subjective:  Patient ID: Jaime [name], female    DOB: 04-Nov-1955  Age: 65 y.o. MRN: [id number]  CC: Diabetes and Anemia  This visit occurred during the SARS-CoV-2 public health emergency.  Safety protocols were in place, including screening questions prior to the visit, additional usage of staff PPE, and extensive cleaning of exam room while observing appropriate contact time as indicated for disinfecting solutions.    HPI CHASSY NORED presents for f/up - She continues to complain of dyspnea on exertion.  Her cardiac work-up revealed diastolic dysfunction.  Outpatient Medications Prior to Visit  Medication Sig Dispense Refill  . aspirin 325 MG tablet Take 325 mg by mouth daily.     Marland Kitchen atorvastatin (LIPITOR) 40 MG tablet Take 1 tablet (40 mg total) by mouth daily. 90 tablet 3  . Empagliflozin-metFORMIN HCl (SYNJARDY) 5-500 MG TABS Take 1 tablet by mouth 2 (two) times daily. 180 tablet 1  . IRON-VITAMIN C PO     . Magnesium 400 MG TABS     . metoprolol succinate (TOPROL XL) 25 MG 24 hr tablet Take 1 tablet (25 mg total) by mouth daily after supper. 90 tablet 3  . Multiple Vitamins-Minerals (MULTIVITAMINS THER. W/MINERALS) TABS Take 1 tablet by mouth as needed.     . pantoprazole (PROTONIX) 40 MG tablet Take 1 tablet (40 mg total) by mouth every morning. 90 tablet 1  . amLODipine (NORVASC) 5 MG tablet Take 1 tablet (5 mg total) by mouth daily. 180 tablet 3  . losartan (COZAAR) 100 MG tablet Take 1 tablet (100 mg total) by mouth daily. 90 tablet 3   No facility-administered medications prior to visit.    ROS Review of Systems  Constitutional: Negative for diaphoresis and fatigue.  HENT: Negative.   Eyes: Negative.   Respiratory: Positive for shortness of breath. Negative for cough and wheezing.   Cardiovascular: Negative for chest pain, palpitations and leg swelling.  Gastrointestinal: Negative for abdominal pain, constipation, diarrhea, nausea and vomiting.  Endocrine:  Negative.   Genitourinary: Negative.  Negative for difficulty urinating.  Musculoskeletal: Negative for arthralgias and myalgias.  Skin: Negative.  Negative for color change.  Neurological: Negative.  Negative for dizziness, weakness and light-headedness.  Hematological: Negative for adenopathy. Does not bruise/bleed easily.  Psychiatric/Behavioral: Negative.     Objective:  BP 124/86   Pulse 76   Temp 98.1 F (36.7 C) (Oral)   Resp 16   Ht 5\' 6"  (1.676 m)   Wt 177 lb (80.3 kg)   SpO2 97%   BMI 28.57 kg/m   BP Readings from Last 3 Encounters:  07/27/20 124/86  07/02/20 116/70  04/02/20 140/88    Wt Readings from Last 3 Encounters:  07/27/20 177 lb (80.3 kg)  07/02/20 176 lb (79.8 kg)  04/02/20 186 lb (84.4 kg)    Physical Exam Vitals reviewed.  Constitutional:      Appearance: Normal appearance.  HENT:     Nose: Nose normal.     Mouth/Throat:     Mouth: Mucous membranes are moist.  Eyes:     General: No scleral icterus.    Conjunctiva/sclera: Conjunctivae normal.  Cardiovascular:     Rate and Rhythm: Normal rate and regular rhythm.     Heart sounds: No murmur heard.   Pulmonary:     Effort: Pulmonary effort is normal.     Breath sounds: No stridor. No wheezing, rhonchi or rales.  Abdominal:     General: Abdomen is flat.  Palpations: There is no mass.     Tenderness: There is no abdominal tenderness. There is no guarding.  Musculoskeletal:        General: Normal range of motion.     Cervical back: Neck supple.     Right lower leg: No edema.     Left lower leg: No edema.  Lymphadenopathy:     Cervical: No cervical adenopathy.  Skin:    General: Skin is warm and dry.  Neurological:     General: No focal deficit present.     Mental Status: She is alert.     Lab Results  Component Value Date   WBC 9.2 07/27/2020   HGB 10.7 (L) 07/27/2020   HCT 32.7 (L) 07/27/2020   PLT 242.0 07/27/2020   GLUCOSE 112 (H) 07/27/2020   CHOL 139 03/02/2020    TRIG 72 03/02/2020   HDL 41 (L) 03/02/2020   LDLCALC 83 03/02/2020   ALT 9 03/02/2020   AST 17 03/02/2020   NA 140 07/27/2020   K 3.5 07/27/2020   CL 106 07/27/2020   CREATININE 0.84 07/27/2020   BUN 7 07/27/2020   CO2 27 07/27/2020   TSH 2.19 03/02/2020   HGBA1C 6.4 07/27/2020   MICROALBUR 2.5 03/02/2020    MM 3D SCREEN BREAST BILATERAL  Result Date: 07/20/2020 CLINICAL DATA:  Screening. EXAM: DIGITAL SCREENING BILATERAL MAMMOGRAM WITH TOMO AND CAD COMPARISON:  Previous exam(s). ACR Breast Density Category b: There are scattered areas of fibroglandular density. FINDINGS: There are no findings suspicious for malignancy. The images were evaluated with computer-aided detection. IMPRESSION: No mammographic evidence of malignancy. A result letter of this screening mammogram will be mailed directly to the patient. RECOMMENDATION: Screening mammogram in one year. (Code:SM-B-01Y) BI-RADS CATEGORY  1: Negative. Electronically Signed   By: Lajean Manes M.D.   On: 07/20/2020 10:52     Sonographer:    Wilford [employer] Rodgers-Jaidyn Usery RDCS Referring Phys: [id number] HEATHER E [place]  IMPRESSIONS    1. Left ventricular ejection fraction, by estimation, is 60 to 65%. The left ventricle has normal function. The left ventricle has no regional wall motion abnormalities. Left ventricular diastolic parameters are consistent with Grade I diastolic  dysfunction (impaired relaxation).  2. Right ventricular systolic function is normal. The right ventricular size is normal. There is normal pulmonary artery systolic pressure.  3. The mitral valve is normal in structure. Trivial mitral valve regurgitation. No evidence of mitral stenosis.  4. The aortic valve is tricuspid. Aortic valve regurgitation is not visualized. No aortic stenosis is present.  5. The inferior vena cava is normal in size with greater than 50% respiratory variability, suggesting right atrial pressure of 3 mmHg.  FINDINGS  Left Ventricle:  Left ventricular ejection fraction, by estimation, is 60 to 65%. The left ventricle has normal function. The left ventricle has no regional wall motion abnormalities. The left ventricular internal cavity size was normal in size. There is  no left ventricular hypertrophy. Left ventricular diastolic parameters are consistent with Grade I diastolic dysfunction (impaired relaxation). Indeterminate filling pressures.  Right Ventricle: The right ventricular size is normal. No increase in right ventricular wall thickness. Right ventricular systolic function is normal. There is normal pulmonary artery systolic pressure. The tricuspid regurgitant velocity is 2.57 m/s, and  with an assumed right atrial pressure of 3 mmHg, the estimated right ventricular systolic pressure is 68.3 mmHg.  Left Atrium: Left atrial size was normal in size.  Right Atrium: Right atrial size was normal in size.  Pericardium: There is no evidence of pericardial effusion.  Mitral Valve: The mitral valve is normal in structure. Trivial mitral valve regurgitation. No evidence of mitral valve stenosis.  Tricuspid Valve: The tricuspid valve is normal in structure. Tricuspid valve regurgitation is mild . No evidence of tricuspid stenosis.  Aortic Valve: The aortic valve is tricuspid. Aortic valve regurgitation is not visualized. No aortic stenosis is present.  Pulmonic Valve: The pulmonic valve was normal in structure. Pulmonic valve regurgitation is not visualized. No evidence of pulmonic stenosis.  Aorta: The aortic root is normal in size and structure.  Venous: The inferior vena cava is normal in size with greater than 50% respiratory variability, suggesting right atrial pressure of 3 mmHg.  IAS/Shunts: No atrial level shunt detected by color flow Doppler.    Assessment & Plan:   Allen was seen today for diabetes and anemia.  Diagnoses and all orders for this visit:  Deficiency anemia- She has a chronic,  stable anemia.  Screening for vitamin deficiencies has been normal thus far.  I will check for thiamine deficiency. -     CBC with Differential/Platelet; Future -     Vitamin B1; Future -     Vitamin B1 -     CBC with Differential/Platelet  Essential hypertension- Her blood pressure is adequately well controlled. -     Basic metabolic panel; Future -     Basic metabolic panel  Type II diabetes mellitus with manifestations ([place])- Her blood sugar is adequately well controlled. -     Hemoglobin A1c; Future -     Hemoglobin W8G  Diastolic dysfunction without heart failure- She has a normal volume status.   I am having Zenia Resides. Conyer maintain her multivitamins ther. w/minerals, aspirin, Synjardy, metoprolol succinate, atorvastatin, losartan, amLODipine, pantoprazole, IRON-VITAMIN C PO, and Magnesium.  No orders of the defined types were placed in this encounter.    Follow-up: Return in about 6 months (around 01/24/2021).  Scarlette Calico, MD

## 2020-07-27 NOTE — Patient Instructions (Signed)
Type 2 Diabetes Mellitus, Diagnosis, Adult Type 2 diabetes (type 2 diabetes mellitus) is a long-term, or chronic, disease. In type 2 diabetes, one or both of these problems may be present:  The pancreas does not make enough of a hormone called insulin.  Cells in the body do not respond properly to insulin that the body makes (insulin resistance). Normally, insulin allows blood sugar (glucose) to enter cells in the body. The cells use glucose for energy. Insulin resistance or lack of insulin causes excess glucose to build up in the blood instead of going into cells. This causes high blood glucose (hyperglycemia).  What are the causes? The exact cause of type 2 diabetes is not known. What increases the risk? The following factors may make you more likely to develop this condition:  Having a family member with type 2 diabetes.  Being overweight or obese.  Being inactive (sedentary).  Having been diagnosed with insulin resistance.  Having a history of prediabetes, diabetes when you were pregnant (gestational diabetes), or polycystic ovary syndrome (PCOS). What are the signs or symptoms? In the early stage of this condition, you may not have symptoms. Symptoms develop slowly and may include:  Increased thirst or hunger.  Increased urination.  Unexplained weight loss.  Tiredness (fatigue) or weakness.  Vision changes, such as blurry vision.  Dark patches on the skin. How is this diagnosed? This condition is diagnosed based on your symptoms, your medical history, a physical exam, and your blood glucose level. Your blood glucose may be checked with one or more of the following blood tests:  A fasting blood glucose (FBG) test. You will not be allowed to eat (you will fast) for 8 hours or longer before a blood sample is taken.  A random blood glucose test. This test checks blood glucose at any time of day regardless of when you ate.  An A1C (hemoglobin A1C) blood test. This test  provides information about blood glucose levels over the previous 2-3 months.  An oral glucose tolerance test (OGTT). This test measures your blood glucose at two times: ? After fasting. This is your baseline blood glucose level. ? Two hours after drinking a beverage that contains glucose. You may be diagnosed with type 2 diabetes if:  Your fasting blood glucose level is 126 mg/dL (7.0 mmol/L) or higher.  Your random blood glucose level is 200 mg/dL (11.1 mmol/L) or higher.  Your A1C level is 6.5% or higher.  Your oral glucose tolerance test result is higher than 200 mg/dL (11.1 mmol/L). These blood tests may be repeated to confirm your diagnosis.   How is this treated? Your treatment may be managed by a specialist called an endocrinologist. Type 2 diabetes may be treated by following instructions from your health care provider about:  Making dietary and lifestyle changes. These may include: ? Following a personalized nutrition plan that is developed by a registered dietitian. ? Exercising regularly. ? Finding ways to manage stress.  Checking your blood glucose level as often as told.  Taking diabetes medicines or insulin daily. This helps to keep your blood glucose levels in the healthy range.  Taking medicines to help prevent complications from diabetes. Medicines may include: ? Aspirin. ? Medicine to lower cholesterol. ? Medicine to control blood pressure. Your health care provider will set treatment goals for you. Your goals will be based on your age, other medical conditions you have, and how you respond to diabetes treatment. Generally, the goal of treatment is to maintain the   following blood glucose levels:  Before meals: 80-130 mg/dL (4.4-7.2 mmol/L).  After meals: below 180 mg/dL (10 mmol/L).  A1C level: less than 7%. Follow these instructions at home: Questions to ask your health care provider Consider asking the following questions:  Should I meet with a certified  diabetes care and education specialist?  What diabetes medicines do I need, and when should I take them?  What equipment will I need to manage my diabetes at home?  How often do I need to check my blood glucose?  Where can I find a support group for people with diabetes?  What number can I call if I have questions?  When is my next appointment? General instructions  Take over-the-counter and prescription medicines only as told by your health care provider.  Keep all follow-up visits as told by your health care provider. This is important. Where to find more information  American Diabetes Association (ADA): www.diabetes.org  American Association of Diabetes Care and Education Specialists (ADCES): www.diabeteseducator.org  International Diabetes Federation (IDF): www.idf.org Contact a health care provider if:  Your blood glucose is at or above 240 mg/dL (13.3 mmol/L) for 2 days in a row.  You have been sick or have had a fever for 2 days or longer, and you are not getting better.  You have any of the following problems for more than 6 hours: ? You cannot eat or drink. ? You have nausea and vomiting. ? You have diarrhea. Get help right away if:  You have severe hypoglycemia. This means your blood glucose is lower than 54 mg/dL (3.0 mmol/L).  You become confused or you have trouble thinking clearly.  You have difficulty breathing.  You have moderate or large ketone levels in your urine. These symptoms may represent a serious problem that is an emergency. Do not wait to see if the symptoms will go away. Get medical help right away. Call your local emergency services (911 in the [place] Do not drive yourself to the hospital. Summary  Type 2 diabetes (type 2 diabetes mellitus) is a long-term, or chronic, disease. In type 2 diabetes, the pancreas does not make enough of a hormone called insulin, or cells in the body do not respond properly to insulin that the body makes (insulin  resistance).  This condition is treated by making dietary and lifestyle changes and taking diabetes medicines or insulin.  Your health care provider will set treatment goals for you. Your goals will be based on your age, other medical conditions you have, and how you respond to diabetes treatment.  Keep all follow-up visits as told by your health care provider. This is important. This information is not intended to replace advice given to you by your health care provider. Make sure you discuss any questions you have with your health care provider. Document Revised: 01/01/2020 Document Reviewed: 01/01/2020 Elsevier Patient Education  2021 Elsevier Inc.  

## 2020-07-31 ENCOUNTER — Other Ambulatory Visit: Payer: Self-pay | Admitting: Internal Medicine

## 2020-07-31 DIAGNOSIS — D539 Nutritional anemia, unspecified: Secondary | ICD-10-CM

## 2020-07-31 LAB — VITAMIN B1: Vitamin B1 (Thiamine): 16 nmol/L (ref 8–30)

## 2020-09-02 LAB — HM DIABETES EYE EXAM

## 2020-09-14 DIAGNOSIS — H04123 Dry eye syndrome of bilateral lacrimal glands: Secondary | ICD-10-CM | POA: Diagnosis not present

## 2020-09-14 DIAGNOSIS — H5021 Vertical strabismus, right eye: Secondary | ICD-10-CM | POA: Diagnosis not present

## 2020-09-14 DIAGNOSIS — H2513 Age-related nuclear cataract, bilateral: Secondary | ICD-10-CM | POA: Diagnosis not present

## 2020-09-14 DIAGNOSIS — H532 Diplopia: Secondary | ICD-10-CM | POA: Diagnosis not present

## 2020-09-16 ENCOUNTER — Other Ambulatory Visit: Payer: Self-pay

## 2020-09-16 ENCOUNTER — Ambulatory Visit (INDEPENDENT_AMBULATORY_CARE_PROVIDER_SITE_OTHER): Payer: 59 | Admitting: Internal Medicine

## 2020-09-16 ENCOUNTER — Encounter: Payer: Self-pay | Admitting: Internal Medicine

## 2020-09-16 ENCOUNTER — Telehealth: Payer: Self-pay | Admitting: Internal Medicine

## 2020-09-16 ENCOUNTER — Ambulatory Visit (INDEPENDENT_AMBULATORY_CARE_PROVIDER_SITE_OTHER): Payer: 59

## 2020-09-16 VITALS — BP 124/82 | HR 67 | Temp 98.3°F | Ht 66.0 in | Wt 169.0 lb

## 2020-09-16 DIAGNOSIS — R911 Solitary pulmonary nodule: Secondary | ICD-10-CM | POA: Diagnosis not present

## 2020-09-16 DIAGNOSIS — R778 Other specified abnormalities of plasma proteins: Secondary | ICD-10-CM | POA: Diagnosis not present

## 2020-09-16 DIAGNOSIS — R10817 Generalized abdominal tenderness: Secondary | ICD-10-CM

## 2020-09-16 DIAGNOSIS — D649 Anemia, unspecified: Secondary | ICD-10-CM

## 2020-09-16 LAB — CBC WITH DIFFERENTIAL/PLATELET
Basophils Absolute: 0.1 10*3/uL (ref 0.0–0.1)
Basophils Relative: 0.9 % (ref 0.0–3.0)
Eosinophils Absolute: 0.4 10*3/uL (ref 0.0–0.7)
Eosinophils Relative: 3.8 % (ref 0.0–5.0)
HCT: 35.1 % — ABNORMAL LOW (ref 36.0–46.0)
Hemoglobin: 11.5 g/dL — ABNORMAL LOW (ref 12.0–15.0)
Lymphocytes Relative: 23 % (ref 12.0–46.0)
Lymphs Abs: 2.2 10*3/uL (ref 0.7–4.0)
MCHC: 32.8 g/dL (ref 30.0–36.0)
MCV: 83.7 fl (ref 78.0–100.0)
Monocytes Absolute: 0.6 10*3/uL (ref 0.1–1.0)
Monocytes Relative: 6.5 % (ref 3.0–12.0)
Neutro Abs: 6.3 10*3/uL (ref 1.4–7.7)
Neutrophils Relative %: 65.8 % (ref 43.0–77.0)
Platelets: 291 10*3/uL (ref 150.0–400.0)
RBC: 4.19 Mil/uL (ref 3.87–5.11)
RDW: 16.4 % — ABNORMAL HIGH (ref 11.5–15.5)
WBC: 9.6 10*3/uL (ref 4.0–10.5)

## 2020-09-16 LAB — HEPATIC FUNCTION PANEL
ALT: 8 U/L (ref 0–35)
AST: 14 U/L (ref 0–37)
Albumin: 4.1 g/dL (ref 3.5–5.2)
Alkaline Phosphatase: 108 U/L (ref 39–117)
Bilirubin, Direct: 0.1 mg/dL (ref 0.0–0.3)
Total Bilirubin: 0.7 mg/dL (ref 0.2–1.2)
Total Protein: 7.6 g/dL (ref 6.0–8.3)

## 2020-09-16 LAB — URINALYSIS, ROUTINE W REFLEX MICROSCOPIC
Bilirubin Urine: NEGATIVE
Hgb urine dipstick: NEGATIVE
Ketones, ur: NEGATIVE
Leukocytes,Ua: NEGATIVE
Nitrite: NEGATIVE
RBC / HPF: NONE SEEN (ref 0–?)
Specific Gravity, Urine: 1.015 (ref 1.000–1.030)
Total Protein, Urine: NEGATIVE
Urine Glucose: >=1000 — AB
Urobilinogen, UA: 1 (ref 0.0–1.0)
pH: 6 (ref 5.0–8.0)

## 2020-09-16 LAB — BASIC METABOLIC PANEL
BUN: 8 mg/dL (ref 6–23)
CO2: 28 mEq/L (ref 19–32)
Calcium: 8.8 mg/dL (ref 8.4–10.5)
Chloride: 101 mEq/L (ref 96–112)
Creatinine, Ser: 0.77 mg/dL (ref 0.40–1.20)
GFR: 81.73 mL/min (ref >60.00)
Glucose, Bld: 104 mg/dL — ABNORMAL HIGH (ref 70–99)
Potassium: 3.4 mEq/L — ABNORMAL LOW (ref 3.5–5.1)
Sodium: 139 mEq/L (ref 135–145)

## 2020-09-16 LAB — LIPASE: Lipase: 12 U/L (ref 11.0–59.0)

## 2020-09-16 LAB — AMYLASE: Amylase: 50 U/L (ref 27–131)

## 2020-09-16 NOTE — Telephone Encounter (Signed)
Jaime [name] from Radiology has called regarding the results of the ABD  She said to give her a call back [phone]

## 2020-09-16 NOTE — Progress Notes (Signed)
Subjective:  Patient ID: Jaime [name], female    DOB: [birth year]  Age: 65 y.o. MRN: [id number]  CC: Abdominal Pain and Anemia  This visit occurred during the SARS-CoV-2 public health emergency.  Safety protocols were in place, including screening questions prior to the visit, additional usage of staff PPE, and extensive cleaning of exam room while observing appropriate contact time as indicated for disinfecting solutions.    HPI Jaime [name] presents for f/up -   She complains of a 1 week history of abdominal pain.  When the pain started she had a brief period of diarrhea.  Now she tells me she has not had a bowel movement for 5 days.  She tells me that eating exacerbates the abdominal pain and she is afraid to eat.  She describes the discomfort as a stabbing pain throughout her entire abdomen.  The pain is exacerbated by coughing and movement.  She has had nausea and loss of appetite but denies vomiting.  She denies bright red blood per rectum or melena.  She has not taken anything to control the discomfort.  Outpatient Medications Prior to Visit  Medication Sig Dispense Refill  . aspirin 325 MG tablet Take 325 mg by mouth daily.     Marland Kitchen atorvastatin (LIPITOR) 40 MG tablet Take 1 tablet (40 mg total) by mouth daily. 90 tablet 3  . Empagliflozin-metFORMIN HCl (SYNJARDY) 5-500 MG TABS Take 1 tablet by mouth 2 (two) times daily. 180 tablet 1  . IRON-VITAMIN C PO     . Magnesium 400 MG TABS     . metoprolol succinate (TOPROL XL) 25 MG 24 hr tablet Take 1 tablet (25 mg total) by mouth daily after supper. 90 tablet 3  . Multiple Vitamins-Minerals (MULTIVITAMINS THER. W/MINERALS) TABS Take 1 tablet by mouth as needed.     . pantoprazole (PROTONIX) 40 MG tablet Take 1 tablet (40 mg total) by mouth every morning. 90 tablet 1  . amLODipine (NORVASC) 5 MG tablet Take 1 tablet (5 mg total) by mouth daily. 180 tablet 3  . losartan (COZAAR) 100 MG tablet Take 1 tablet (100 mg total) by mouth  daily. 90 tablet 3   No facility-administered medications prior to visit.    ROS Review of Systems  Constitutional: Positive for appetite change. Negative for chills, diaphoresis, fatigue, fever and unexpected weight change.  HENT: Negative.  Negative for trouble swallowing.   Eyes: Negative.   Respiratory: Positive for cough. Negative for chest tightness, shortness of breath and wheezing.   Cardiovascular: Negative for chest pain, palpitations and leg swelling.  Gastrointestinal: Positive for abdominal pain and nausea. Negative for blood in stool, constipation, diarrhea, rectal pain and vomiting.  Endocrine: Negative.   Genitourinary: Negative.  Negative for decreased urine volume, difficulty urinating, dysuria and hematuria.  Musculoskeletal: Negative.   Skin: Negative.  Negative for color change and rash.  Neurological: Negative.  Negative for dizziness, weakness, light-headedness and headaches.  Hematological: Negative for adenopathy. Does not bruise/bleed easily.  Psychiatric/Behavioral: Negative.     Objective:  BP 124/82   Pulse 67   Temp 98.3 F (36.8 C) (Oral)   Ht 5\' 6"  (1.676 m)   Wt 169 lb (76.7 kg)   SpO2 98%   BMI 27.28 kg/m   BP Readings from Last 3 Encounters:  09/16/20 124/82  07/27/20 124/86  07/02/20 116/70    Wt Readings from Last 3 Encounters:  09/16/20 169 lb (76.7 kg)  07/27/20 177 lb (80.3 kg)  07/02/20  176 lb (79.8 kg)    Physical Exam Vitals reviewed.  Constitutional:      General: She is not in acute distress.    Appearance: She is not ill-appearing, toxic-appearing or diaphoretic.  HENT:     Nose: Nose normal.     Mouth/Throat:     Mouth: Mucous membranes are moist.  Eyes:     General: No scleral icterus.    Conjunctiva/sclera: Conjunctivae normal.  Cardiovascular:     Rate and Rhythm: Normal rate and regular rhythm.     Heart sounds: No murmur heard.   Pulmonary:     Effort: Pulmonary effort is normal.     Breath sounds: No  stridor. No wheezing, rhonchi or rales.  Abdominal:     General: Abdomen is flat. Bowel sounds are decreased. There is no distension.     Palpations: Abdomen is soft. There is no hepatomegaly, splenomegaly or mass.     Tenderness: There is abdominal tenderness in the right upper quadrant, right lower quadrant, epigastric area and periumbilical area. There is no guarding or rebound.     Hernia: No hernia is present.  Musculoskeletal:        General: Normal range of motion.     Cervical back: Neck supple.     Right lower leg: No edema.     Left lower leg: No edema.  Lymphadenopathy:     Cervical: No cervical adenopathy.  Skin:    General: Skin is warm and dry.     Coloration: Skin is not jaundiced or pale.     Findings: No rash.  Neurological:     General: No focal deficit present.     Mental Status: She is alert.  Psychiatric:        Mood and Affect: Mood normal.        Behavior: Behavior normal.     Lab Results  Component Value Date   WBC 9.6 09/16/2020   HGB 11.5 (L) 09/16/2020   HCT 35.1 (L) 09/16/2020   PLT 291.0 09/16/2020   GLUCOSE 104 (H) 09/16/2020   CHOL 139 03/02/2020   TRIG 72 03/02/2020   HDL 41 (L) 03/02/2020   LDLCALC 83 03/02/2020   ALT 8 09/16/2020   AST 14 09/16/2020   NA 139 09/16/2020   K 3.4 (L) 09/16/2020   CL 101 09/16/2020   CREATININE 0.77 09/16/2020   BUN 8 09/16/2020   CO2 28 09/16/2020   TSH 2.19 03/02/2020   HGBA1C 6.4 07/27/2020   MICROALBUR 2.5 03/02/2020    MM 3D SCREEN BREAST BILATERAL  Result Date: 07/20/2020 CLINICAL DATA:  Screening. EXAM: DIGITAL SCREENING BILATERAL MAMMOGRAM WITH TOMO AND CAD COMPARISON:  Previous exam(s). ACR Breast Density Category b: There are scattered areas of fibroglandular density. FINDINGS: There are no findings suspicious for malignancy. The images were evaluated with computer-aided detection. IMPRESSION: No mammographic evidence of malignancy. A result letter of this screening mammogram will be mailed  directly to the patient. RECOMMENDATION: Screening mammogram in one year. (Code:SM-B-01Y) BI-RADS CATEGORY  1: Negative. Electronically Signed   By: Lajean Manes M.D.   On: 07/20/2020 10:52   DG ABD ACUTE 2+V W 1V CHEST  Result Date: 09/16/2020 CLINICAL DATA:  Abdominal pain and diarrhea for 2 weeks. EXAM: DG ABDOMEN ACUTE WITH 1 VIEW CHEST COMPARISON:  None. FINDINGS: Heart size is normal. No pleural effusion or edema. Nodular density within the lateral right base measures 1.3 cm. New from previous exam. No airspace consolidation. The bowel gas pattern  appears nonobstructive. No dilated loops of large or small bowel. IMPRESSION: 1. Nonobstructive bowel gas pattern. 2. Nodular density within the right lung base is indeterminate. Consider further evaluation with nonemergent CT of the chest. These results will be called to the ordering clinician or representative by the Radiologist Assistant, and communication documented in the PACS or [employer]. Electronically Signed   By: Kerby Moors M.D.   On: 09/16/2020 10:03    Assessment & Plan:   Yareliz was seen today for abdominal pain and anemia.  Diagnoses and all orders for this visit:  Anemia, unspecified type- Her H&H have improved.  I will screen her for secondary causes of chronic anemia. -     CBC with Differential/Platelet; Future -     Lactate dehydrogenase; Future -     Haptoglobin; Future -     Hemoglobinopathy evaluation; Future -     Hemoglobinopathy evaluation -     Haptoglobin -     Lactate dehydrogenase -     CBC with Differential/Platelet -     Hemoglobinopathy evaluation; Future  Generalized abdominal tenderness without rebound tenderness- She has abdominal tenderness, slightly decreased bowel sounds, slightly elevated anion gap, slightly elevated haptoglobin, normal plain films, and the rest of her labs are normal.  I am concerned she has an occult abdominal process like ischemic bowel, ileitis or colitis, or bowel  obstruction.  I recommended that she undergo a CT scan of the abdomen and pelvis with contrast. -     Lipase; Future -     Basic metabolic panel; Future -     Amylase; Future -     Hepatic function panel; Future -     Urinalysis, Routine w reflex microscopic; Future -     DG ABD ACUTE 2+V W 1V CHEST; Future -     Urinalysis, Routine w reflex microscopic -     Hepatic function panel -     Amylase -     Basic metabolic panel -     Lipase -     CT Abdomen Pelvis W Contrast; Future  Nodule of lower lobe of right lung -     CT Chest W Contrast; Future  Solitary pulmonary nodule -     CT Chest W Contrast; Future  High serum haptoglobin- See above. -     CT Abdomen Pelvis W Contrast; Future   I am having Jaime Resides. [name] maintain her multivitamins ther. w/minerals, aspirin, Synjardy, metoprolol succinate, atorvastatin, losartan, amLODipine, pantoprazole, IRON-VITAMIN C PO, and Magnesium.  No orders of the defined types were placed in this encounter.  I spent 45 minutes in preparing to see the patient by review of recent labs and images, obtaining and reviewing separately obtained history, communicating with the patient, ordering tests, and documenting clinical information in the EHR including the differential Dx, treatment, and any further evaluation and other management of 1. Anemia, unspecified type 2. Generalized abdominal tenderness without rebound tenderness 3. Nodule of lower lobe of right lung 4. Solitary pulmonary nodule 5. High serum haptoglobin     Follow-up: No follow-ups on file.  Scarlette Calico, MD

## 2020-09-16 NOTE — Telephone Encounter (Signed)
I spoke to [facility] and they stated they have it noted where PCP has communicated with the pt in regard to results.

## 2020-09-17 ENCOUNTER — Encounter (HOSPITAL_COMMUNITY): Payer: Self-pay

## 2020-09-17 ENCOUNTER — Ambulatory Visit (INDEPENDENT_AMBULATORY_CARE_PROVIDER_SITE_OTHER)
Admission: RE | Admit: 2020-09-17 | Discharge: 2020-09-17 | Disposition: A | Discharge status: Home / Self Care | Payer: 59 | Source: Ambulatory Visit | Attending: Internal Medicine | Admitting: Internal Medicine

## 2020-09-17 ENCOUNTER — Other Ambulatory Visit: Payer: Self-pay

## 2020-09-17 ENCOUNTER — Inpatient Hospital Stay (HOSPITAL_COMMUNITY)
Admission: EM | Admit: 2020-09-17 | Discharge: 2020-09-21 | DRG: 373 | Disposition: A | Discharge status: Home / Self Care | Payer: 59 | Attending: General Surgery | Admitting: General Surgery

## 2020-09-17 ENCOUNTER — Encounter: Payer: Self-pay | Admitting: Internal Medicine

## 2020-09-17 DIAGNOSIS — K37 Unspecified appendicitis: Secondary | ICD-10-CM | POA: Diagnosis not present

## 2020-09-17 DIAGNOSIS — Z8249 Family history of ischemic heart disease and other diseases of the circulatory system: Secondary | ICD-10-CM

## 2020-09-17 DIAGNOSIS — R590 Localized enlarged lymph nodes: Secondary | ICD-10-CM | POA: Diagnosis not present

## 2020-09-17 DIAGNOSIS — Z20822 Contact with and (suspected) exposure to covid-19: Secondary | ICD-10-CM | POA: Diagnosis not present

## 2020-09-17 DIAGNOSIS — Z7982 Long term (current) use of aspirin: Secondary | ICD-10-CM | POA: Diagnosis not present

## 2020-09-17 DIAGNOSIS — R778 Other specified abnormalities of plasma proteins: Secondary | ICD-10-CM | POA: Insufficient documentation

## 2020-09-17 DIAGNOSIS — Z833 Family history of diabetes mellitus: Secondary | ICD-10-CM | POA: Diagnosis not present

## 2020-09-17 DIAGNOSIS — Z79899 Other long term (current) drug therapy: Secondary | ICD-10-CM

## 2020-09-17 DIAGNOSIS — I10 Essential (primary) hypertension: Secondary | ICD-10-CM | POA: Diagnosis not present

## 2020-09-17 DIAGNOSIS — K3532 Acute appendicitis with perforation and localized peritonitis, without abscess: Principal | ICD-10-CM | POA: Diagnosis present

## 2020-09-17 DIAGNOSIS — Z800 Family history of malignant neoplasm of digestive organs: Secondary | ICD-10-CM

## 2020-09-17 DIAGNOSIS — R10817 Generalized abdominal tenderness: Secondary | ICD-10-CM

## 2020-09-17 DIAGNOSIS — R911 Solitary pulmonary nodule: Secondary | ICD-10-CM | POA: Insufficient documentation

## 2020-09-17 DIAGNOSIS — K7689 Other specified diseases of liver: Secondary | ICD-10-CM | POA: Diagnosis not present

## 2020-09-17 DIAGNOSIS — R918 Other nonspecific abnormal finding of lung field: Secondary | ICD-10-CM | POA: Diagnosis not present

## 2020-09-17 DIAGNOSIS — I739 Peripheral vascular disease, unspecified: Secondary | ICD-10-CM | POA: Diagnosis present

## 2020-09-17 DIAGNOSIS — K5750 Diverticulosis of both small and large intestine without perforation or abscess without bleeding: Secondary | ICD-10-CM | POA: Diagnosis not present

## 2020-09-17 DIAGNOSIS — Z87891 Personal history of nicotine dependence: Secondary | ICD-10-CM | POA: Diagnosis not present

## 2020-09-17 DIAGNOSIS — Z801 Family history of malignant neoplasm of trachea, bronchus and lung: Secondary | ICD-10-CM | POA: Diagnosis not present

## 2020-09-17 DIAGNOSIS — K3589 Other acute appendicitis without perforation or gangrene: Secondary | ICD-10-CM

## 2020-09-17 DIAGNOSIS — K219 Gastro-esophageal reflux disease without esophagitis: Secondary | ICD-10-CM | POA: Diagnosis present

## 2020-09-17 DIAGNOSIS — E785 Hyperlipidemia, unspecified: Secondary | ICD-10-CM | POA: Diagnosis not present

## 2020-09-17 DIAGNOSIS — R739 Hyperglycemia, unspecified: Secondary | ICD-10-CM | POA: Diagnosis not present

## 2020-09-17 DIAGNOSIS — M47817 Spondylosis without myelopathy or radiculopathy, lumbosacral region: Secondary | ICD-10-CM | POA: Diagnosis not present

## 2020-09-17 DIAGNOSIS — R1031 Right lower quadrant pain: Secondary | ICD-10-CM | POA: Diagnosis not present

## 2020-09-17 LAB — LIPASE, BLOOD: Lipase: 32 U/L (ref 11–51)

## 2020-09-17 LAB — COMPREHENSIVE METABOLIC PANEL
ALT: 10 U/L (ref 0–44)
AST: 15 U/L (ref 15–41)
Albumin: 3.7 g/dL (ref 3.5–5.0)
Alkaline Phosphatase: 99 U/L (ref 38–126)
Anion gap: 10 (ref 5–15)
BUN: 8 mg/dL (ref 8–23)
CO2: 25 mmol/L (ref 22–32)
Calcium: 8.9 mg/dL (ref 8.9–10.3)
Chloride: 103 mmol/L (ref 98–111)
Creatinine, Ser: 0.78 mg/dL (ref 0.44–1.00)
GFR, Estimated: >60 mL/min (ref >60)
Glucose, Bld: 137 mg/dL — ABNORMAL HIGH (ref 70–99)
Potassium: 3.1 mmol/L — ABNORMAL LOW (ref 3.5–5.1)
Sodium: 138 mmol/L (ref 135–145)
Total Bilirubin: 0.5 mg/dL (ref 0.3–1.2)
Total Protein: 7.4 g/dL (ref 6.5–8.1)

## 2020-09-17 LAB — CBC
HCT: 34.6 % — ABNORMAL LOW (ref 36.0–46.0)
Hemoglobin: 10.9 g/dL — ABNORMAL LOW (ref 12.0–15.0)
MCH: 27.3 pg (ref 26.0–34.0)
MCHC: 31.5 g/dL (ref 30.0–36.0)
MCV: 86.7 fL (ref 80.0–100.0)
Platelets: 306 10*3/uL (ref 150–400)
RBC: 3.99 MIL/uL (ref 3.87–5.11)
RDW: 15.9 % — ABNORMAL HIGH (ref 11.5–15.5)
WBC: 10.3 10*3/uL (ref 4.0–10.5)
nRBC: 0 % (ref 0.0–0.2)

## 2020-09-17 LAB — RESP PANEL BY RT-PCR (FLU A&B, COVID) ARPGX2
Influenza A by PCR: NEGATIVE
Influenza B by PCR: NEGATIVE
SARS Coronavirus 2 by RT PCR: NEGATIVE

## 2020-09-17 LAB — LACTATE DEHYDROGENASE: LDH: 180 U/L (ref 120–250)

## 2020-09-17 LAB — HAPTOGLOBIN: Haptoglobin: 277 mg/dL — ABNORMAL HIGH (ref 43–212)

## 2020-09-17 MED ORDER — ONDANSETRON 4 MG PO TBDP: 4.0000 mg | ORAL_TABLET | Freq: Four times a day (QID) | ORAL | Status: DC | PRN

## 2020-09-17 MED ORDER — METRONIDAZOLE IN NACL 5-0.79 MG/ML-% IV SOLN
500.0000 mg | Freq: Three times a day (TID) | INTRAVENOUS | Status: DC
  Administered 2020-09-17 – 2020-09-20 (×8): 500 mg via INTRAVENOUS
  Filled 2020-09-17 (×8): qty 100

## 2020-09-17 MED ORDER — LACTATED RINGERS IV BOLUS
1000.0000 mL | Freq: Once | INTRAVENOUS | Status: AC
  Administered 2020-09-17: 1000 mL via INTRAVENOUS

## 2020-09-17 MED ORDER — IOHEXOL 300 MG/ML  SOLN
100.0000 mL | Freq: Once | INTRAMUSCULAR | Status: AC | PRN
  Administered 2020-09-17: 100 mL via INTRAVENOUS

## 2020-09-17 MED ORDER — LOSARTAN POTASSIUM 50 MG PO TABS
100.0000 mg | ORAL_TABLET | Freq: Every day | ORAL | Status: DC
  Administered 2020-09-17 – 2020-09-21 (×5): 100 mg via ORAL
  Filled 2020-09-17 (×5): qty 2

## 2020-09-17 MED ORDER — MORPHINE SULFATE (PF) 2 MG/ML IV SOLN: 2.0000 mg | INTRAVENOUS | Status: DC | PRN

## 2020-09-17 MED ORDER — MORPHINE SULFATE (PF) 4 MG/ML IV SOLN
4.0000 mg | Freq: Once | INTRAVENOUS | Status: AC
Start: 2020-09-17 — End: 2020-09-17
  Administered 2020-09-17: 4 mg via INTRAVENOUS
  Filled 2020-09-17: qty 1

## 2020-09-17 MED ORDER — KCL IN DEXTROSE-NACL 20-5-0.45 MEQ/L-%-% IV SOLN
INTRAVENOUS | Status: DC
  Filled 2020-09-17 (×6): qty 1000

## 2020-09-17 MED ORDER — AMLODIPINE BESYLATE 5 MG PO TABS
5.0000 mg | ORAL_TABLET | Freq: Every day | ORAL | Status: DC
  Administered 2020-09-17 – 2020-09-21 (×4): 5 mg via ORAL
  Filled 2020-09-17 (×5): qty 1

## 2020-09-17 MED ORDER — METOPROLOL SUCCINATE ER 25 MG PO TB24
25.0000 mg | ORAL_TABLET | Freq: Every day | ORAL | Status: DC
  Administered 2020-09-18 – 2020-09-20 (×3): 25 mg via ORAL
  Filled 2020-09-17 (×3): qty 1

## 2020-09-17 MED ORDER — SODIUM CHLORIDE 0.9 % IV SOLN
2.0000 g | INTRAVENOUS | Status: DC
  Administered 2020-09-17 – 2020-09-19 (×3): 2 g via INTRAVENOUS
  Filled 2020-09-17 (×3): qty 2

## 2020-09-17 MED ORDER — PANTOPRAZOLE SODIUM 40 MG PO TBEC
40.0000 mg | DELAYED_RELEASE_TABLET | Freq: Every morning | ORAL | Status: DC
  Administered 2020-09-18 – 2020-09-20 (×3): 40 mg via ORAL
  Filled 2020-09-17 (×4): qty 1

## 2020-09-17 MED ORDER — HYDROCODONE-ACETAMINOPHEN 5-325 MG PO TABS
1.0000 | ORAL_TABLET | ORAL | Status: DC | PRN
  Administered 2020-09-18: 1 via ORAL
  Filled 2020-09-17 (×2): qty 1

## 2020-09-17 MED ORDER — ONDANSETRON HCL 4 MG/2ML IJ SOLN: 4.0000 mg | Freq: Four times a day (QID) | INTRAMUSCULAR | Status: DC | PRN

## 2020-09-17 MED ORDER — DIPHENHYDRAMINE HCL 12.5 MG/5ML PO ELIX
12.5000 mg | ORAL_SOLUTION | Freq: Four times a day (QID) | ORAL | Status: DC | PRN
  Administered 2020-09-18: 12.5 mg via ORAL
  Filled 2020-09-17: qty 5

## 2020-09-17 MED ORDER — ENOXAPARIN SODIUM 40 MG/0.4ML ~~LOC~~ SOLN
40.0000 mg | SUBCUTANEOUS | Status: DC
  Administered 2020-09-17 – 2020-09-20 (×4): 40 mg via SUBCUTANEOUS
  Filled 2020-09-17 (×4): qty 0.4

## 2020-09-17 MED ORDER — DIPHENHYDRAMINE HCL 50 MG/ML IJ SOLN: 12.5000 mg | Freq: Four times a day (QID) | INTRAMUSCULAR | Status: DC | PRN

## 2020-09-17 NOTE — ED Triage Notes (Signed)
Patient c/o RLQ pain since last week. Patient had a CT abdomen today. Patiaent was called and told she had appendicitis.

## 2020-09-17 NOTE — Patient Instructions (Signed)
Abdominal Pain, Adult Pain in the abdomen (abdominal pain) can be caused by many things. Often, abdominal pain is not serious and it gets better with no treatment or by being treated at home. However, sometimes abdominal pain is serious. Your health care provider will ask questions about your medical history and do a physical exam to try to determine the cause of your abdominal pain. Follow these instructions at home: Medicines  Take over-the-counter and prescription medicines only as told by your health care provider.  Do not take a laxative unless told by your health care provider. General instructions  Watch your condition for any changes.  Drink enough fluid to keep your urine pale yellow.  Keep all follow-up visits as told by your health care provider. This is important.   Contact a health care provider if:  Your abdominal pain changes or gets worse.  You are not hungry or you lose weight without trying.  You are constipated or have diarrhea for more than 2-3 days.  You have pain when you urinate or have a bowel movement.  Your abdominal pain wakes you up at night.  Your pain gets worse with meals, after eating, or with certain foods.  You are vomiting and cannot keep anything down.  You have a fever.  You have blood in your urine. Get help right away if:  Your pain does not go away as soon as your health care provider told you to expect.  You cannot stop vomiting.  Your pain is only in areas of the abdomen, such as the right side or the left lower portion of the abdomen. Pain on the right side could be caused by appendicitis.  You have bloody or black stools, or stools that look like tar.  You have severe pain, cramping, or bloating in your abdomen.  You have signs of dehydration, such as: ? Dark urine, very little urine, or no urine. ? Cracked lips. ? Dry mouth. ? Sunken eyes. ? Sleepiness. ? Weakness.  You have trouble breathing or chest  pain. Summary  Often, abdominal pain is not serious and it gets better with no treatment or by being treated at home. However, sometimes abdominal pain is serious.  Watch your condition for any changes.  Take over-the-counter and prescription medicines only as told by your health care provider.  Contact a health care provider if your abdominal pain changes or gets worse.  Get help right away if you have severe pain, cramping, or bloating in your abdomen. This information is not intended to replace advice given to you by your health care provider. Make sure you discuss any questions you have with your health care provider. Document Revised: 07/26/2019 Document Reviewed: 10/15/2018 Elsevier Patient Education  [employer].

## 2020-09-17 NOTE — H&P (Signed)
CC: RLQ pain  Requesting provider: Dr Tomi Bamberger  HPI: Jaime [name] is an 65 y.o. female who is here for a 2 wk h/o RLQ pain, nausea and diarrhea.  This became persistent and her PCP ordered a CT scan.  This showed possible appendicitis.  She denies fevers or chills.  She takes 325mg  aspirin on a daily basis.  She has a h/o umbilical hernia repair as a baby.    Past Medical History:  Diagnosis Date  . Endometriosis   . GERD (gastroesophageal reflux disease)   . Hypertension   . Meningioma ([place])   . Migraine   . Nontoxic multinodular goiter   . 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 Peptic ulcer disease   . PVD (peripheral vascular disease) ([place])     Past Surgical History:  Procedure Laterality Date  . CT RADIATION THERAPY GUIDE     CT done yearly for menigioma with radiation if needed  . CYST EXCISION     head  . CYST EXCISION Left    x 2   . cyst removed     left wrist  . ORIF ANKLE FRACTURE     right  . OVARIAN CYST REMOVAL     right  . SHOULDER SURGERY Right    Spur removal  . TONSILLECTOMY  1965  . TRIGGER FINGER RELEASE  2012   right thumb  . TUBAL LIGATION    . VIDEO BRONCHOSCOPY  11/10/2011   Procedure: VIDEO BRONCHOSCOPY WITH FLUORO;  Surgeon: Tanda Rockers, MD;  Location: [facility];  Service: Cardiopulmonary;  Laterality: Bilateral;    Family History  Problem Relation Age of Onset  . Hypertension Mother   . Diabetes Mother   . Heart disease Mother   . Heart failure Mother   . Dementia Mother   . Lung cancer Mother   . Esophageal cancer Father        was a smoker  . Colon cancer Maternal Grandmother   . Breast cancer Neg Hx   . Coronary artery disease Neg Hx     Social:  reports that she quit smoking about 39 years ago. Her smoking use included cigarettes. She has a 0.90 pack-year smoking history. She has never used smokeless tobacco. She reports that she does not drink alcohol and does not use  drugs.  Allergies: No Known Allergies  Medications: I have reviewed the patient's current medications.  Results for orders placed or performed during the hospital encounter of 09/17/20 (from the past 48 hour(s))  Lipase, blood     Status: None   Collection Time: 09/17/20  4:26 PM  Result Value Ref Range   Lipase 32 11 - 51 U/L    Comment: Performed at [facility], [place] [address] [place], [place] [address]  Comprehensive metabolic panel     Status: Abnormal   Collection Time: 09/17/20  4:26 PM  Result Value Ref Range   Sodium 138 135 - 145 mmol/L   Potassium 3.1 (L) 3.5 - 5.1 mmol/L   Chloride 103 98 - 111 mmol/L   CO2 25 22 - 32 mmol/L   Glucose, Bld 137 (H) 70 - 99 mg/dL    Comment: Glucose reference range applies only to samples taken after fasting for at least 8 hours.   BUN 8 8 - 23 mg/dL   Creatinine, Ser 0.78 0.44 - 1.00 mg/dL   Calcium 8.9 8.9 - 10.3 mg/dL   Total Protein  7.4 6.5 - 8.1 g/dL   Albumin 3.7 3.5 - 5.0 g/dL   AST 15 15 - 41 U/L   ALT 10 0 - 44 U/L   Alkaline Phosphatase 99 38 - 126 U/L   Total Bilirubin 0.5 0.3 - 1.2 mg/dL   GFR, Estimated >60 >60 mL/min    Comment: (NOTE) Calculated using the CKD-EPI Creatinine Equation (2021)    Anion gap 10 5 - 15    Comment: Performed at [facility], [place] [address] [place], [place] [address]  CBC     Status: Abnormal   Collection Time: 09/17/20  4:26 PM  Result Value Ref Range   WBC 10.3 4.0 - 10.5 K/uL   RBC 3.99 3.87 - 5.11 MIL/uL   Hemoglobin 10.9 (L) 12.0 - 15.0 g/dL   HCT 34.6 (L) 36.0 - 46.0 %   MCV 86.7 80.0 - 100.0 fL   MCH 27.3 26.0 - 34.0 pg   MCHC 31.5 30.0 - 36.0 g/dL   RDW 15.9 (H) 11.5 - 15.5 %   Platelets 306 150 - 400 K/uL   nRBC 0.0 0.0 - 0.2 %    Comment: Performed at [facility], [place] [address] [place], [place] [address]  Resp Panel by RT-PCR (Flu A&B, Covid) Nasopharyngeal Swab     Status: None   Collection Time: 09/17/20  4:36  PM   Specimen: Nasopharyngeal Swab; Nasopharyngeal(NP) swabs in vial transport medium  Result Value Ref Range   SARS Coronavirus 2 by RT PCR NEGATIVE NEGATIVE    Comment: (NOTE) SARS-CoV-2 target nucleic acids are NOT DETECTED.  The SARS-CoV-2 RNA is generally detectable in upper respiratory specimens during the acute phase of infection. The lowest concentration of SARS-CoV-2 viral copies this assay can detect is 138 copies/mL. A negative result does not preclude SARS-Cov-2 infection and should not be used as the sole basis for treatment or other patient management decisions. A negative result may occur with  improper specimen collection/handling, submission of specimen other than nasopharyngeal swab, presence of viral mutation(s) within the areas targeted by this assay, and inadequate number of viral copies(<138 copies/mL). A negative result must be combined with clinical observations, patient history, and epidemiological information. The expected result is Negative.  Fact Sheet for Patients:  EntrepreneurPulse.com.au  Fact Sheet for Healthcare Providers:  IncredibleEmployment.be  This test is no t yet approved or cleared by the [place] FDA and  has been authorized for detection and/or diagnosis of SARS-CoV-2 by FDA under an Emergency Use Authorization (EUA). This EUA will remain  in effect (meaning this test can be used) for the duration of the COVID-19 declaration under Section 564(b)(1) of the Act, 21 U.S.C.section 360bbb-3(b)(1), unless the authorization is terminated  or revoked sooner.       Influenza A by PCR NEGATIVE NEGATIVE   Influenza B by PCR NEGATIVE NEGATIVE    Comment: (NOTE) The Xpert Xpress SARS-CoV-2/FLU/RSV plus assay is intended as an aid in the diagnosis of influenza from Nasopharyngeal swab specimens and should not be used as a sole basis for treatment. Nasal washings and aspirates are unacceptable for Xpert  Xpress SARS-CoV-2/FLU/RSV testing.  Fact Sheet for Patients: EntrepreneurPulse.com.au  Fact Sheet for Healthcare Providers: IncredibleEmployment.be  This test is not yet approved or cleared by the [place] FDA and has been authorized for detection and/or diagnosis of SARS-CoV-2 by FDA under an Emergency Use Authorization (EUA). This EUA will remain in effect (meaning this test can be used) for the duration  of the COVID-19 declaration under Section 564(b)(1) of the Act, 21 U.S.C. section 360bbb-3(b)(1), unless the authorization is terminated or revoked.  Performed at [facility], [place] [address], [place], [place] [address]     CT CHEST ABDOMEN PELVIS W CONTRAST  Result Date: 09/17/2020 CLINICAL DATA:  Acute nonlocalized abdominal pain for 2 weeks. Right lower quadrant pain. Pulmonary nodule. EXAM: CT CHEST, ABDOMEN, AND PELVIS WITH CONTRAST TECHNIQUE: Multidetector CT imaging of the chest, abdomen and pelvis was performed following the standard protocol during bolus administration of intravenous contrast. CONTRAST:  170mL OMNIPAQUE IOHEXOL 300 MG/ML  SOLN COMPARISON:  February 16, 2011 FINDINGS: CT CHEST FINDINGS Cardiovascular: No significant vascular findings. Normal heart size. No pericardial effusion. Mediastinum/Nodes: No enlarged mediastinal or axillary lymph nodes. Mildly enlarged partially calcified lymph nodes are seen in the bilateral hilar regions, measuring up to 14 mm in short axis. Thyroid gland, trachea, and esophagus demonstrate no significant findings. Lungs/Pleura: 3 mm perifissural soft tissue nodule in the superior segment of right lower lobe. Several tiny less than 2-3 mm soft tissue nodules in the right lung base. Musculoskeletal: No chest wall mass or suspicious bone lesions identified. CT ABDOMEN PELVIS FINDINGS Hepatobiliary: 8 mm too small to be actually characterized well circumscribed nodule the right lobe of  the liver. Normal gallbladder. Pancreas: Unremarkable. No pancreatic ductal dilatation or surrounding inflammatory changes. Spleen: Normal in size without focal abnormality. Adrenals/Urinary Tract: Adrenal glands are unremarkable. Kidneys are normal, without renal calculi, focal lesion, or hydronephrosis. Bladder is unremarkable. Stomach/Bowel: Stomach is within normal limits. The appendix measures 8 mm in cross-section with mild periappendiceal fat stranding, particularly at the tip the appendix. Otherwise no evidence of bowel wall thickening, distention, or inflammatory changes. Left colonic diverticulosis without evidence of diverticulitis. Vascular/Lymphatic: No significant vascular findings are present. No enlarged abdominal or pelvic lymph nodes. Reproductive: Uterus and bilateral adnexa are unremarkable. Other: No abdominal wall hernia or abnormality. No abdominopelvic ascites. Musculoskeletal: Spondylosis lower lumbosacral spine. No acute osseous findings. IMPRESSION: 1. Mildly prominent size of the appendix with mild periappendiceal fat stranding. These findings are inconclusive for, but may be seen with early tip appendicitis. 2. Mildly enlarged partially calcified bilateral hilar lymph nodes, measuring up to 14 mm in short axis. This may represent sequela of prior granulomatous disease. 3. 3 mm perifissural soft tissue nodule in the right lower lobe, possibly representing intraparenchymal lymph node. No follow-up needed if patient is low-risk. Non-contrast chest CT can be considered in 12 months if patient is high-risk. This recommendation follows the consensus statement: Guidelines for Management of Incidental Pulmonary Nodules Detected on CT Images: From the Fleischner Society 2017; Radiology 2017; 284:228-243. 4. Several tiny less than 2-3 mm soft tissue nodules in the right lung base. These are of doubtful clinical significance. 5. 8 mm too small to be actually characterized well circumscribed nodule  in the right lobe of the liver, likely benign. 6. Left colonic diverticulosis without evidence of diverticulitis. Electronically Signed   By: Fidela Salisbury M.D.   On: 09/17/2020 14:40   DG ABD ACUTE 2+V W 1V CHEST  Result Date: 09/16/2020 CLINICAL DATA:  Abdominal pain and diarrhea for 2 weeks. EXAM: DG ABDOMEN ACUTE WITH 1 VIEW CHEST COMPARISON:  None. FINDINGS: Heart size is normal. No pleural effusion or edema. Nodular density within the lateral right base measures 1.3 cm. New from previous exam. No airspace consolidation. The bowel gas pattern appears nonobstructive. No dilated loops of large or small bowel. IMPRESSION: 1. Nonobstructive  bowel gas pattern. 2. Nodular density within the right lung base is indeterminate. Consider further evaluation with nonemergent CT of the chest. These results will be called to the ordering clinician or representative by the Radiologist Assistant, and communication documented in the PACS or [employer]. Electronically Signed   By: Kerby Moors M.D.   On: 09/16/2020 10:03    ROS - all of the below systems have been reviewed with the patient and positives are indicated with bold text General: chills, fever or night sweats Eyes: blurry vision or double vision ENT: epistaxis or sore throat Allergy/Immunology: itchy/watery eyes or nasal congestion Hematologic/Lymphatic: bleeding problems, blood clots or swollen lymph nodes Endocrine: temperature intolerance or unexpected weight changes Breast: new or changing breast lumps or nipple discharge Resp: cough, shortness of breath, or wheezing CV: chest pain or dyspnea on exertion GI: as per HPI GU: dysuria, trouble voiding, or hematuria MSK: joint pain or joint stiffness Neuro: TIA or stroke symptoms Derm: pruritus and skin lesion changes Psych: anxiety and depression  PE Blood pressure 131/84, pulse 79, temperature 98 F (36.7 C), temperature source Oral, resp. rate 18, height 5\' 6"  (1.676 m), weight  76.7 kg, SpO2 100 %. Constitutional: NAD; conversant; no deformities Eyes: Moist conjunctiva; no lid lag; anicteric; PERRL Neck: Trachea midline; no thyromegaly Lungs: Normal respiratory effort; no tactile fremitus CV: RRR; no palpable thrills; no pitting edema GI: Abd TTP RLQ; no palpable hepatosplenomegaly MSK: Normal range of motion of extremities; no clubbing/cyanosis Psychiatric: Appropriate affect; alert and oriented x3 Lymphatic: No palpable cervical or axillary lymphadenopathy  Results for orders placed or performed during the hospital encounter of 09/17/20 (from the past 48 hour(s))  Lipase, blood     Status: None   Collection Time: 09/17/20  4:26 PM  Result Value Ref Range   Lipase 32 11 - 51 U/L    Comment: Performed at [facility], [place] [address] [place], [place] [address]  Comprehensive metabolic panel     Status: Abnormal   Collection Time: 09/17/20  4:26 PM  Result Value Ref Range   Sodium 138 135 - 145 mmol/L   Potassium 3.1 (L) 3.5 - 5.1 mmol/L   Chloride 103 98 - 111 mmol/L   CO2 25 22 - 32 mmol/L   Glucose, Bld 137 (H) 70 - 99 mg/dL    Comment: Glucose reference range applies only to samples taken after fasting for at least 8 hours.   BUN 8 8 - 23 mg/dL   Creatinine, Ser 0.78 0.44 - 1.00 mg/dL   Calcium 8.9 8.9 - 10.3 mg/dL   Total Protein 7.4 6.5 - 8.1 g/dL   Albumin 3.7 3.5 - 5.0 g/dL   AST 15 15 - 41 U/L   ALT 10 0 - 44 U/L   Alkaline Phosphatase 99 38 - 126 U/L   Total Bilirubin 0.5 0.3 - 1.2 mg/dL   GFR, Estimated >60 >60 mL/min    Comment: (NOTE) Calculated using the CKD-EPI Creatinine Equation (2021)    Anion gap 10 5 - 15    Comment: Performed at [facility], [place] [address], [place], [place] [address]  CBC     Status: Abnormal   Collection Time: 09/17/20  4:26 PM  Result Value Ref Range   WBC 10.3 4.0 - 10.5 K/uL   RBC 3.99 3.87 - 5.11 MIL/uL   Hemoglobin 10.9 (L) 12.0 - 15.0 g/dL   HCT 34.6 (L) 36.0 -  46.0 %   MCV 86.7  80.0 - 100.0 fL   MCH 27.3 26.0 - 34.0 pg   MCHC 31.5 30.0 - 36.0 g/dL   RDW 15.9 (H) 11.5 - 15.5 %   Platelets 306 150 - 400 K/uL   nRBC 0.0 0.0 - 0.2 %    Comment: Performed at [facility], [place] [address] [place], [place] [address]  Resp Panel by RT-PCR (Flu A&B, Covid) Nasopharyngeal Swab     Status: None   Collection Time: 09/17/20  4:36 PM   Specimen: Nasopharyngeal Swab; Nasopharyngeal(NP) swabs in vial transport medium  Result Value Ref Range   SARS Coronavirus 2 by RT PCR NEGATIVE NEGATIVE    Comment: (NOTE) SARS-CoV-2 target nucleic acids are NOT DETECTED.  The SARS-CoV-2 RNA is generally detectable in upper respiratory specimens during the acute phase of infection. The lowest concentration of SARS-CoV-2 viral copies this assay can detect is 138 copies/mL. A negative result does not preclude SARS-Cov-2 infection and should not be used as the sole basis for treatment or other patient management decisions. A negative result may occur with  improper specimen collection/handling, submission of specimen other than nasopharyngeal swab, presence of viral mutation(s) within the areas targeted by this assay, and inadequate number of viral copies(<138 copies/mL). A negative result must be combined with clinical observations, patient history, and epidemiological information. The expected result is Negative.  Fact Sheet for Patients:  EntrepreneurPulse.com.au  Fact Sheet for Healthcare Providers:  IncredibleEmployment.be  This test is no t yet approved or cleared by the [place] FDA and  has been authorized for detection and/or diagnosis of SARS-CoV-2 by FDA under an Emergency Use Authorization (EUA). This EUA will remain  in effect (meaning this test can be used) for the duration of the COVID-19 declaration under Section 564(b)(1) of the Act, 21 U.S.C.section 360bbb-3(b)(1), unless the authorization  is terminated  or revoked sooner.       Influenza A by PCR NEGATIVE NEGATIVE   Influenza B by PCR NEGATIVE NEGATIVE    Comment: (NOTE) The Xpert Xpress SARS-CoV-2/FLU/RSV plus assay is intended as an aid in the diagnosis of influenza from Nasopharyngeal swab specimens and should not be used as a sole basis for treatment. Nasal washings and aspirates are unacceptable for Xpert Xpress SARS-CoV-2/FLU/RSV testing.  Fact Sheet for Patients: EntrepreneurPulse.com.au  Fact Sheet for Healthcare Providers: IncredibleEmployment.be  This test is not yet approved or cleared by the [place] FDA and has been authorized for detection and/or diagnosis of SARS-CoV-2 by FDA under an Emergency Use Authorization (EUA). This EUA will remain in effect (meaning this test can be used) for the duration of the COVID-19 declaration under Section 564(b)(1) of the Act, 21 U.S.C. section 360bbb-3(b)(1), unless the authorization is terminated or revoked.  Performed at [facility], [place] [address], [place], [place] [address]     CT CHEST ABDOMEN PELVIS W CONTRAST  Result Date: 09/17/2020 CLINICAL DATA:  Acute nonlocalized abdominal pain for 2 weeks. Right lower quadrant pain. Pulmonary nodule. EXAM: CT CHEST, ABDOMEN, AND PELVIS WITH CONTRAST TECHNIQUE: Multidetector CT imaging of the chest, abdomen and pelvis was performed following the standard protocol during bolus administration of intravenous contrast. CONTRAST:  195mL OMNIPAQUE IOHEXOL 300 MG/ML  SOLN COMPARISON:  February 16, 2011 FINDINGS: CT CHEST FINDINGS Cardiovascular: No significant vascular findings. Normal heart size. No pericardial effusion. Mediastinum/Nodes: No enlarged mediastinal or axillary lymph nodes. Mildly enlarged partially calcified lymph nodes are seen in the bilateral hilar regions, measuring up to 14 mm in short  axis. Thyroid gland, trachea, and esophagus demonstrate no  significant findings. Lungs/Pleura: 3 mm perifissural soft tissue nodule in the superior segment of right lower lobe. Several tiny less than 2-3 mm soft tissue nodules in the right lung base. Musculoskeletal: No chest wall mass or suspicious bone lesions identified. CT ABDOMEN PELVIS FINDINGS Hepatobiliary: 8 mm too small to be actually characterized well circumscribed nodule the right lobe of the liver. Normal gallbladder. Pancreas: Unremarkable. No pancreatic ductal dilatation or surrounding inflammatory changes. Spleen: Normal in size without focal abnormality. Adrenals/Urinary Tract: Adrenal glands are unremarkable. Kidneys are normal, without renal calculi, focal lesion, or hydronephrosis. Bladder is unremarkable. Stomach/Bowel: Stomach is within normal limits. The appendix measures 8 mm in cross-section with mild periappendiceal fat stranding, particularly at the tip the appendix. Otherwise no evidence of bowel wall thickening, distention, or inflammatory changes. Left colonic diverticulosis without evidence of diverticulitis. Vascular/Lymphatic: No significant vascular findings are present. No enlarged abdominal or pelvic lymph nodes. Reproductive: Uterus and bilateral adnexa are unremarkable. Other: No abdominal wall hernia or abnormality. No abdominopelvic ascites. Musculoskeletal: Spondylosis lower lumbosacral spine. No acute osseous findings. IMPRESSION: 1. Mildly prominent size of the appendix with mild periappendiceal fat stranding. These findings are inconclusive for, but may be seen with early tip appendicitis. 2. Mildly enlarged partially calcified bilateral hilar lymph nodes, measuring up to 14 mm in short axis. This may represent sequela of prior granulomatous disease. 3. 3 mm perifissural soft tissue nodule in the right lower lobe, possibly representing intraparenchymal lymph node. No follow-up needed if patient is low-risk. Non-contrast chest CT can be considered in 12 months if patient is  high-risk. This recommendation follows the consensus statement: Guidelines for Management of Incidental Pulmonary Nodules Detected on CT Images: From the Fleischner Society 2017; Radiology 2017; 284:228-243. 4. Several tiny less than 2-3 mm soft tissue nodules in the right lung base. These are of doubtful clinical significance. 5. 8 mm too small to be actually characterized well circumscribed nodule in the right lobe of the liver, likely benign. 6. Left colonic diverticulosis without evidence of diverticulitis. Electronically Signed   By: Fidela Salisbury M.D.   On: 09/17/2020 14:40   DG ABD ACUTE 2+V W 1V CHEST  Result Date: 09/16/2020 CLINICAL DATA:  Abdominal pain and diarrhea for 2 weeks. EXAM: DG ABDOMEN ACUTE WITH 1 VIEW CHEST COMPARISON:  None. FINDINGS: Heart size is normal. No pleural effusion or edema. Nodular density within the lateral right base measures 1.3 cm. New from previous exam. No airspace consolidation. The bowel gas pattern appears nonobstructive. No dilated loops of large or small bowel. IMPRESSION: 1. Nonobstructive bowel gas pattern. 2. Nodular density within the right lung base is indeterminate. Consider further evaluation with nonemergent CT of the chest. These results will be called to the ordering clinician or representative by the Radiologist Assistant, and communication documented in the PACS or [employer]. Electronically Signed   By: Kerby Moors M.D.   On: 09/16/2020 10:03     A/P: DAANYA LANPHIER is an 65 y.o. female with what appears to be resolving appendicitis from 2 weeks ago.  I have recommended admission and IV abx.  Due to her being on daily aspirin therapy, surgery would present a bleeding risk.  I think it is reasonable to treat medically and follow her physical exam and symptoms with plan for surgery if medical treatment fails.     Rosario Adie, MD  [facility]

## 2020-09-17 NOTE — Addendum Note (Signed)
Addended by: Janith Lima on: 09/17/2020 02:48 PM   Modules accepted: Orders

## 2020-09-17 NOTE — ED Provider Notes (Signed)
[facility] DEPT Provider Note   CSN: [id number] Arrival date & time: 09/17/20  1615     History Chief Complaint  Patient presents with  . Abdominal Pain    Jaime [name] is a 65 y.o. female.  HPI   Patient presents to the ED for evaluation of an abnormal CT scan.  Patient has been having about a week of abdominal pain.  Patient describes the pain is more stabbing throughout her abdomen.  She has had nausea and decreased appetite but no vomiting.  Patient has also had a change in her bowels with some constipation and now she feels like her stools are loose.  She went to see her doctor yesterday.  She was noted to have tenderness in her abdomen and had an outpatient CT scan performed.  Patient CT scan shows evidence of mild periappendiceal fat stranding and a mildly prominent appendix.  Findings were inconclusive but suggestive for possible early tip appendicitis.  Patient was called today instructed to come to the ED.  Patient last ate at about 3:00 this afternoon.  She had a sandwich  Past Medical History:  Diagnosis Date  . Endometriosis   . GERD (gastroesophageal reflux disease)   . Hypertension   . Meningioma ([place])   . Migraine   . Nontoxic multinodular goiter   . 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 Peptic ulcer disease   . PVD (peripheral vascular disease) [facility])     Patient Active Problem List   Diagnosis Date Noted  . High serum haptoglobin 09/17/2020  . Solitary pulmonary nodule 09/17/2020  . Appendicitis 09/17/2020  . Anemia 09/16/2020  . Generalized abdominal tenderness without rebound tenderness 09/16/2020  . Nodule of lower lobe of right lung 09/16/2020  . Deficiency anemia 07/27/2020  . Diastolic dysfunction without heart failure 07/27/2020  . Screening for cervical cancer 03/03/2020  . Hyperlipidemia LDL goal <130 03/02/2020  . EKG, abnormal 03/02/2020  . Gastroesophageal reflux  disease 05/15/2017  . Type II diabetes mellitus with manifestations ([place]) 08/02/2015  . Routine general medical examination at a health care facility 03/29/2015  . Hidrocystoma 01/28/2014  . Depression 04/08/2013  . Meningioma ([place]) 11/29/2012  . Normocytic anemia 12/26/2011  . Hilar adenopathy 09/08/2011  . Essential hypertension 06/09/2009  . MENINGIOMA 02/06/2007  . GOITER, MULTINODULAR 02/06/2007  . Carotid artery disease ([place]) 02/06/2007  . PERIPHERAL VASCULAR DISEASE 02/06/2007    Past Surgical History:  Procedure Laterality Date  . CT RADIATION THERAPY GUIDE     CT done yearly for menigioma with radiation if needed  . CYST EXCISION     head  . CYST EXCISION Left    x 2   . cyst removed     left wrist  . ORIF ANKLE FRACTURE     right  . OVARIAN CYST REMOVAL     right  . SHOULDER SURGERY Right    Spur removal  . TONSILLECTOMY  1965  . TRIGGER FINGER RELEASE  2012   right thumb  . TUBAL LIGATION    . VIDEO BRONCHOSCOPY  11/10/2011   Procedure: VIDEO BRONCHOSCOPY WITH FLUORO;  Surgeon: Tanda Rockers, MD;  Location: [facility];  Service: Cardiopulmonary;  Laterality: Bilateral;     OB History   No obstetric history on file.     Family History  Problem Relation Age of Onset  . Hypertension Mother   . Diabetes Mother   . Heart disease Mother   .  Heart failure Mother   . Dementia Mother   . Lung cancer Mother   . Esophageal cancer Father        was a smoker  . Colon cancer Maternal Grandmother   . Breast cancer Neg Hx   . Coronary artery disease Neg Hx     Social History   Tobacco Use  . Smoking status: Former Smoker    Packs/day: 0.30    Years: 3.00    Pack years: 0.90    Types: Cigarettes    Quit date: 06/20/1981    Years since quitting: 39.2  . Smokeless tobacco: Never Used  Vaping Use  . Vaping Use: Never used  Substance Use Topics  . Alcohol use: No  . Drug use: No    Home Medications Prior to Admission medications   Medication Sig  Start Date End Date Taking? Authorizing Provider  amLODipine (NORVASC) 5 MG tablet Take 1 tablet (5 mg total) by mouth daily. 04/17/20 07/16/20  Freada Bergeron, MD  aspirin 325 MG tablet Take 325 mg by mouth daily.     [provider]  atorvastatin (LIPITOR) 40 MG tablet Take 1 tablet (40 mg total) by mouth daily. 04/02/20   Freada Bergeron, MD  Empagliflozin-metFORMIN HCl (SYNJARDY) 5-500 MG TABS Take 1 tablet by mouth 2 (two) times daily. 03/03/20   Janith Lima, MD  IRON-VITAMIN C PO  04/03/20   [provider]  losartan (COZAAR) 100 MG tablet Take 1 tablet (100 mg total) by mouth daily. 04/08/20 07/07/20  Freada Bergeron, MD  Magnesium 400 MG TABS  04/03/20   [provider]  metoprolol succinate (TOPROL XL) 25 MG 24 hr tablet Take 1 tablet (25 mg total) by mouth daily after supper. 04/02/20   Freada Bergeron, MD  Multiple Vitamins-Minerals (MULTIVITAMINS THER. W/MINERALS) TABS Take 1 tablet by mouth as needed.     [provider]  pantoprazole (PROTONIX) 40 MG tablet Take 1 tablet (40 mg total) by mouth every morning. 05/10/20   Janith Lima, MD    Allergies    Patient has no known allergies.  Review of Systems   Review of Systems  All other systems reviewed and are negative.   Physical Exam Updated Vital Signs BP 131/84   Pulse 79   Temp 98 F (36.7 C) (Oral)   Resp 18   Ht 1.676 m (5\' 6" )   Wt 76.7 kg   SpO2 100%   BMI 27.28 kg/m   Physical Exam Vitals and nursing note reviewed.  Constitutional:      General: She is not in acute distress.    Appearance: She is well-developed.  HENT:     Head: Normocephalic and atraumatic.     Right Ear: External ear normal.     Left Ear: External ear normal.  Eyes:     General: No scleral icterus.       Right eye: No discharge.        Left eye: No discharge.     Conjunctiva/sclera: Conjunctivae normal.  Neck:     Trachea: No tracheal deviation.  Cardiovascular:      Rate and Rhythm: Normal rate and regular rhythm.  Pulmonary:     Effort: Pulmonary effort is normal. No respiratory distress.     Breath sounds: Normal breath sounds. No stridor. No wheezing or rales.  Abdominal:     General: Bowel sounds are normal. There is no distension.     Palpations: Abdomen is  soft.     Tenderness: There is abdominal tenderness in the right lower quadrant. There is no guarding or rebound.  Musculoskeletal:        General: No tenderness.     Cervical back: Neck supple.  Skin:    General: Skin is warm and dry.     Findings: No rash.  Neurological:     Mental Status: She is alert.     Cranial Nerves: No cranial nerve deficit (no facial droop, extraocular movements intact, no slurred speech).     Sensory: No sensory deficit.     Motor: No abnormal muscle tone or seizure activity.     Coordination: Coordination normal.     ED Results / Procedures / Treatments   Labs (all labs ordered are listed, but only abnormal results are displayed) Labs Reviewed  COMPREHENSIVE METABOLIC PANEL - Abnormal; Notable for the following components:      Result Value   Potassium 3.1 (*)    Glucose, Bld 137 (*)    All other components within normal limits  CBC - Abnormal; Notable for the following components:   Hemoglobin 10.9 (*)    HCT 34.6 (*)    RDW 15.9 (*)    All other components within normal limits  RESP PANEL BY RT-PCR (FLU A&B, COVID) ARPGX2  LIPASE, BLOOD    EKG None  Radiology CT CHEST ABDOMEN PELVIS W CONTRAST  Result Date: 09/17/2020 CLINICAL DATA:  Acute nonlocalized abdominal pain for 2 weeks. Right lower quadrant pain. Pulmonary nodule. EXAM: CT CHEST, ABDOMEN, AND PELVIS WITH CONTRAST TECHNIQUE: Multidetector CT imaging of the chest, abdomen and pelvis was performed following the standard protocol during bolus administration of intravenous contrast. CONTRAST:  137mL OMNIPAQUE IOHEXOL 300 MG/ML  SOLN COMPARISON:  February 16, 2011 FINDINGS: CT CHEST FINDINGS  Cardiovascular: No significant vascular findings. Normal heart size. No pericardial effusion. Mediastinum/Nodes: No enlarged mediastinal or axillary lymph nodes. Mildly enlarged partially calcified lymph nodes are seen in the bilateral hilar regions, measuring up to 14 mm in short axis. Thyroid gland, trachea, and esophagus demonstrate no significant findings. Lungs/Pleura: 3 mm perifissural soft tissue nodule in the superior segment of right lower lobe. Several tiny less than 2-3 mm soft tissue nodules in the right lung base. Musculoskeletal: No chest wall mass or suspicious bone lesions identified. CT ABDOMEN PELVIS FINDINGS Hepatobiliary: 8 mm too small to be actually characterized well circumscribed nodule the right lobe of the liver. Normal gallbladder. Pancreas: Unremarkable. No pancreatic ductal dilatation or surrounding inflammatory changes. Spleen: Normal in size without focal abnormality. Adrenals/Urinary Tract: Adrenal glands are unremarkable. Kidneys are normal, without renal calculi, focal lesion, or hydronephrosis. Bladder is unremarkable. Stomach/Bowel: Stomach is within normal limits. The appendix measures 8 mm in cross-section with mild periappendiceal fat stranding, particularly at the tip the appendix. Otherwise no evidence of bowel wall thickening, distention, or inflammatory changes. Left colonic diverticulosis without evidence of diverticulitis. Vascular/Lymphatic: No significant vascular findings are present. No enlarged abdominal or pelvic lymph nodes. Reproductive: Uterus and bilateral adnexa are unremarkable. Other: No abdominal wall hernia or abnormality. No abdominopelvic ascites. Musculoskeletal: Spondylosis lower lumbosacral spine. No acute osseous findings. IMPRESSION: 1. Mildly prominent size of the appendix with mild periappendiceal fat stranding. These findings are inconclusive for, but may be seen with early tip appendicitis. 2. Mildly enlarged partially calcified bilateral hilar  lymph nodes, measuring up to 14 mm in short axis. This may represent sequela of prior granulomatous disease. 3. 3 mm perifissural soft tissue nodule in  the right lower lobe, possibly representing intraparenchymal lymph node. No follow-up needed if patient is low-risk. Non-contrast chest CT can be considered in 12 months if patient is high-risk. This recommendation follows the consensus statement: Guidelines for Management of Incidental Pulmonary Nodules Detected on CT Images: From the Fleischner Society 2017; Radiology 2017; 284:228-243. 4. Several tiny less than 2-3 mm soft tissue nodules in the right lung base. These are of doubtful clinical significance. 5. 8 mm too small to be actually characterized well circumscribed nodule in the right lobe of the liver, likely benign. 6. Left colonic diverticulosis without evidence of diverticulitis. Electronically Signed   By: Fidela Salisbury M.D.   On: 09/17/2020 14:40   DG ABD ACUTE 2+V W 1V CHEST  Result Date: 09/16/2020 CLINICAL DATA:  Abdominal pain and diarrhea for 2 weeks. EXAM: DG ABDOMEN ACUTE WITH 1 VIEW CHEST COMPARISON:  None. FINDINGS: Heart size is normal. No pleural effusion or edema. Nodular density within the lateral right base measures 1.3 cm. New from previous exam. No airspace consolidation. The bowel gas pattern appears nonobstructive. No dilated loops of large or small bowel. IMPRESSION: 1. Nonobstructive bowel gas pattern. 2. Nodular density within the right lung base is indeterminate. Consider further evaluation with nonemergent CT of the chest. These results will be called to the ordering clinician or representative by the Radiologist Assistant, and communication documented in the PACS or [employer]. Electronically Signed   By: Kerby Moors M.D.   On: 09/16/2020 10:03    Procedures Procedures   Medications Ordered in ED Medications  lactated ringers bolus 1,000 mL (0 mLs Intravenous Stopped 09/17/20 1732)  morphine 4 MG/ML  injection 4 mg (4 mg Intravenous Given 09/17/20 1732)    ED Course  I have reviewed the triage vital signs and the nursing notes.  Pertinent labs & imaging results that were available during my care of the patient were reviewed by me and considered in my medical decision making (see chart for details).    MDM Rules/Calculators/A&P                          Patient presents with concerns for appendicitis.  She does have mild tenderness in the right lower quadrant.  He does not have any guarding or peritoneal signs.  Laboratory tests have been ordered.  I will consult with general surgery.  Case discussed with Dr. Marcello Moores.  She will evaluate the patient in the ED Final Clinical Impression(s) / ED Diagnoses Final diagnoses:  Other acute appendicitis    Rx / DC Orders ED Discharge Orders    None       Dorie Rank, MD 09/17/20 1904

## 2020-09-17 NOTE — Addendum Note (Signed)
Addended by: Jacobo Forest on: 09/17/2020 12:02 PM   Modules accepted: Orders

## 2020-09-18 DIAGNOSIS — I739 Peripheral vascular disease, unspecified: Secondary | ICD-10-CM | POA: Diagnosis present

## 2020-09-18 DIAGNOSIS — K219 Gastro-esophageal reflux disease without esophagitis: Secondary | ICD-10-CM | POA: Diagnosis present

## 2020-09-18 DIAGNOSIS — K3580 Unspecified acute appendicitis: Secondary | ICD-10-CM | POA: Diagnosis not present

## 2020-09-18 DIAGNOSIS — Z20822 Contact with and (suspected) exposure to covid-19: Secondary | ICD-10-CM | POA: Diagnosis present

## 2020-09-18 DIAGNOSIS — K3532 Acute appendicitis with perforation and localized peritonitis, without abscess: Secondary | ICD-10-CM | POA: Diagnosis present

## 2020-09-18 DIAGNOSIS — Z8249 Family history of ischemic heart disease and other diseases of the circulatory system: Secondary | ICD-10-CM | POA: Diagnosis not present

## 2020-09-18 DIAGNOSIS — Z801 Family history of malignant neoplasm of trachea, bronchus and lung: Secondary | ICD-10-CM | POA: Diagnosis not present

## 2020-09-18 DIAGNOSIS — Z7982 Long term (current) use of aspirin: Secondary | ICD-10-CM | POA: Diagnosis not present

## 2020-09-18 DIAGNOSIS — Z87891 Personal history of nicotine dependence: Secondary | ICD-10-CM | POA: Diagnosis not present

## 2020-09-18 DIAGNOSIS — R1031 Right lower quadrant pain: Secondary | ICD-10-CM | POA: Diagnosis present

## 2020-09-18 DIAGNOSIS — Z833 Family history of diabetes mellitus: Secondary | ICD-10-CM | POA: Diagnosis not present

## 2020-09-18 DIAGNOSIS — E785 Hyperlipidemia, unspecified: Secondary | ICD-10-CM | POA: Diagnosis present

## 2020-09-18 DIAGNOSIS — Z79899 Other long term (current) drug therapy: Secondary | ICD-10-CM | POA: Diagnosis not present

## 2020-09-18 DIAGNOSIS — I10 Essential (primary) hypertension: Secondary | ICD-10-CM | POA: Diagnosis present

## 2020-09-18 DIAGNOSIS — Z800 Family history of malignant neoplasm of digestive organs: Secondary | ICD-10-CM | POA: Diagnosis not present

## 2020-09-18 DIAGNOSIS — R739 Hyperglycemia, unspecified: Secondary | ICD-10-CM | POA: Diagnosis present

## 2020-09-18 LAB — CBC
HCT: 30.2 % — ABNORMAL LOW (ref 36.0–46.0)
Hemoglobin: 9.6 g/dL — ABNORMAL LOW (ref 12.0–15.0)
MCH: 27.6 pg (ref 26.0–34.0)
MCHC: 31.8 g/dL (ref 30.0–36.0)
MCV: 86.8 fL (ref 80.0–100.0)
Platelets: 244 10*3/uL (ref 150–400)
RBC: 3.48 MIL/uL — ABNORMAL LOW (ref 3.87–5.11)
RDW: 16 % — ABNORMAL HIGH (ref 11.5–15.5)
WBC: 8 10*3/uL (ref 4.0–10.5)
nRBC: 0 % (ref 0.0–0.2)

## 2020-09-18 LAB — BASIC METABOLIC PANEL
Anion gap: 6 (ref 5–15)
BUN: 6 mg/dL — ABNORMAL LOW (ref 8–23)
CO2: 27 mmol/L (ref 22–32)
Calcium: 8.2 mg/dL — ABNORMAL LOW (ref 8.9–10.3)
Chloride: 106 mmol/L (ref 98–111)
Creatinine, Ser: 0.62 mg/dL (ref 0.44–1.00)
GFR, Estimated: >60 mL/min (ref >60)
Glucose, Bld: 109 mg/dL — ABNORMAL HIGH (ref 70–99)
Potassium: 3.4 mmol/L — ABNORMAL LOW (ref 3.5–5.1)
Sodium: 139 mmol/L (ref 135–145)

## 2020-09-18 LAB — GLUCOSE, CAPILLARY
Glucose-Capillary: 82 mg/dL (ref 70–99)
Glucose-Capillary: 95 mg/dL (ref 70–99)
Glucose-Capillary: 114 mg/dL — ABNORMAL HIGH (ref 70–99)

## 2020-09-18 LAB — HEMOGLOBIN A1C
Hgb A1c MFr Bld: 6.4 % — ABNORMAL HIGH (ref 4.8–5.6)
Mean Plasma Glucose: 136.98 mg/dL

## 2020-09-18 MED ORDER — POTASSIUM CHLORIDE 10 MEQ/100ML IV SOLN
10.0000 meq | INTRAVENOUS | Status: AC
  Administered 2020-09-18 (×4): 10 meq via INTRAVENOUS
  Filled 2020-09-18: qty 100

## 2020-09-18 MED ORDER — INSULIN ASPART 100 UNIT/ML ~~LOC~~ SOLN
0.0000 [IU] | Freq: Three times a day (TID) | SUBCUTANEOUS | Status: DC
  Administered 2020-09-20: 2 [IU] via SUBCUTANEOUS

## 2020-09-18 MED ORDER — INSULIN ASPART 100 UNIT/ML ~~LOC~~ SOLN: 0.0000 [IU] | Freq: Every day | SUBCUTANEOUS | Status: DC

## 2020-09-18 NOTE — Progress Notes (Signed)
[facility] Progress Note     Subjective: CC-  Admitted last night with resolving appendicitis from 2 weeks ago. She was started on IV rocephin and flagyl. Continues to have RLQ discomfort, no better or worse since admission. Mild nausea at times, no emesis. Tolerating clears. She reports diarrhea prior to admission but has not had a BM yet today.  Objective: Vital signs in last 24 hours: Temp:  [98 F (36.7 C)-99.1 F (37.3 C)] 99.1 F (37.3 C) (04/01 0603) Pulse Rate:  [65-92] 71 (04/01 0603) Resp:  [16-18] 16 (04/01 0603) BP: (107-172)/(63-90) 121/78 (04/01 0603) SpO2:  [97 %-100 %] 97 % (04/01 0603) Weight:  [76.7 kg] 76.7 kg (03/31 1622) Last BM Date: 09/17/20  Intake/Output from previous day: 03/31 0701 - 04/01 0700 In: 765.8 [P.O.:180; I.V.:385.8; IV Piggyback:200] Out: 500 [Urine:500] Intake/Output this shift: No intake/output data recorded.  PE: Gen:  Alert, NAD, pleasant Pulm: rate and effort normal Abd: Soft, ND, +BS, focal TTP RLQ and suprapubic region without rebound or guarding, no peritonitis, no HSM Psych: A&Ox4  Skin: no rashes noted, warm and dry  Lab Results:  Recent Labs    09/16/20 0944 09/17/20 1626  WBC 9.6 10.3  HGB 11.5* 10.9*  HCT 35.1* 34.6*  PLT 291.0 306   BMET Recent Labs    09/16/20 0944 09/17/20 1626  NA 139 138  K 3.4* 3.1*  CL 101 103  CO2 28 25  GLUCOSE 104* 137*  BUN 8 8  CREATININE 0.77 0.78  CALCIUM 8.8 8.9   PT/INR No results for input(s): LABPROT, INR in the last 72 hours. CMP     Component Value Date/Time   NA 138 09/17/2020 1626   NA 143 04/09/2020 1327   K 3.1 (L) 09/17/2020 1626   CL 103 09/17/2020 1626   CO2 25 09/17/2020 1626   GLUCOSE 137 (H) 09/17/2020 1626   GLUCOSE 104 (H) 05/05/2006 1206   BUN 8 09/17/2020 1626   BUN 8 04/09/2020 1327   CREATININE 0.78 09/17/2020 1626   CREATININE 0.84 03/02/2020 1458   CALCIUM 8.9 09/17/2020 1626   PROT 7.4 09/17/2020 1626   ALBUMIN 3.7  09/17/2020 1626   AST 15 09/17/2020 1626   ALT 10 09/17/2020 1626   ALKPHOS 99 09/17/2020 1626   BILITOT 0.5 09/17/2020 1626   GFRNONAA >60 09/17/2020 1626   GFRNONAA 74 03/02/2020 1458   GFRAA 80 04/09/2020 1327   GFRAA 86 03/02/2020 1458   Lipase     Component Value Date/Time   LIPASE 32 09/17/2020 1626       Studies/Results: CT CHEST ABDOMEN PELVIS W CONTRAST  Result Date: 09/17/2020 CLINICAL DATA:  Acute nonlocalized abdominal pain for 2 weeks. Right lower quadrant pain. Pulmonary nodule. EXAM: CT CHEST, ABDOMEN, AND PELVIS WITH CONTRAST TECHNIQUE: Multidetector CT imaging of the chest, abdomen and pelvis was performed following the standard protocol during bolus administration of intravenous contrast. CONTRAST:  169mL OMNIPAQUE IOHEXOL 300 MG/ML  SOLN COMPARISON:  February 16, 2011 FINDINGS: CT CHEST FINDINGS Cardiovascular: No significant vascular findings. Normal heart size. No pericardial effusion. Mediastinum/Nodes: No enlarged mediastinal or axillary lymph nodes. Mildly enlarged partially calcified lymph nodes are seen in the bilateral hilar regions, measuring up to 14 mm in short axis. Thyroid gland, trachea, and esophagus demonstrate no significant findings. Lungs/Pleura: 3 mm perifissural soft tissue nodule in the superior segment of right lower lobe. Several tiny less than 2-3 mm soft tissue nodules in the right lung base. Musculoskeletal: No chest  wall mass or suspicious bone lesions identified. CT ABDOMEN PELVIS FINDINGS Hepatobiliary: 8 mm too small to be actually characterized well circumscribed nodule the right lobe of the liver. Normal gallbladder. Pancreas: Unremarkable. No pancreatic ductal dilatation or surrounding inflammatory changes. Spleen: Normal in size without focal abnormality. Adrenals/Urinary Tract: Adrenal glands are unremarkable. Kidneys are normal, without renal calculi, focal lesion, or hydronephrosis. Bladder is unremarkable. Stomach/Bowel: Stomach is within  normal limits. The appendix measures 8 mm in cross-section with mild periappendiceal fat stranding, particularly at the tip the appendix. Otherwise no evidence of bowel wall thickening, distention, or inflammatory changes. Left colonic diverticulosis without evidence of diverticulitis. Vascular/Lymphatic: No significant vascular findings are present. No enlarged abdominal or pelvic lymph nodes. Reproductive: Uterus and bilateral adnexa are unremarkable. Other: No abdominal wall hernia or abnormality. No abdominopelvic ascites. Musculoskeletal: Spondylosis lower lumbosacral spine. No acute osseous findings. IMPRESSION: 1. Mildly prominent size of the appendix with mild periappendiceal fat stranding. These findings are inconclusive for, but may be seen with early tip appendicitis. 2. Mildly enlarged partially calcified bilateral hilar lymph nodes, measuring up to 14 mm in short axis. This may represent sequela of prior granulomatous disease. 3. 3 mm perifissural soft tissue nodule in the right lower lobe, possibly representing intraparenchymal lymph node. No follow-up needed if patient is low-risk. Non-contrast chest CT can be considered in 12 months if patient is high-risk. This recommendation follows the consensus statement: Guidelines for Management of Incidental Pulmonary Nodules Detected on CT Images: From the Fleischner Society 2017; Radiology 2017; 284:228-243. 4. Several tiny less than 2-3 mm soft tissue nodules in the right lung base. These are of doubtful clinical significance. 5. 8 mm too small to be actually characterized well circumscribed nodule in the right lobe of the liver, likely benign. 6. Left colonic diverticulosis without evidence of diverticulitis. Electronically Signed   By: Fidela Salisbury M.D.   On: 09/17/2020 14:40   DG ABD ACUTE 2+V W 1V CHEST  Result Date: 09/16/2020 CLINICAL DATA:  Abdominal pain and diarrhea for 2 weeks. EXAM: DG ABDOMEN ACUTE WITH 1 VIEW CHEST COMPARISON:  None.  FINDINGS: Heart size is normal. No pleural effusion or edema. Nodular density within the lateral right base measures 1.3 cm. New from previous exam. No airspace consolidation. The bowel gas pattern appears nonobstructive. No dilated loops of large or small bowel. IMPRESSION: 1. Nonobstructive bowel gas pattern. 2. Nodular density within the right lung base is indeterminate. Consider further evaluation with nonemergent CT of the chest. These results will be called to the ordering clinician or representative by the Radiologist Assistant, and communication documented in the PACS or [employer]. Electronically Signed   By: Kerby Moors M.D.   On: 09/16/2020 10:03    Anti-infectives: Anti-infectives (From admission, onward)   Start     Dose/Rate Route Frequency Ordered Stop   09/17/20 2200  metroNIDAZOLE (FLAGYL) IVPB 500 mg       "And" Linked Group Details   500 mg 100 mL/hr over 60 Minutes Intravenous Every 8 hours 09/17/20 2016     09/17/20 2100  cefTRIAXone (ROCEPHIN) 2 g in sodium chloride 0.9 % 100 mL IVPB       "And" Linked Group Details   2 g 200 mL/hr over 30 Minutes Intravenous Every 24 hours 09/17/20 2016         Assessment/Plan HTN - home meds GERD - protonix PVD - hold daily aspirin 325mg  Elevated glucose - check A1c   RLQ pain, nausea, diarrhea Appendicitis  -  symptoms present for 2 weeks prior to admission - repeat labs this AM - Attempting nonoperative management with IV antibiotics. Monitor abdominal exam closely. If patient improves will plan for discharge home on oral antibiotics and follow up with Dr. Marcello Moores to discuss interval appendectomy. If she fails conservative treatment will plan for surgery this admission.   ID - rocephin/flagyl 3/31>> FEN - IVF, CLD VTE - lovenox Foley - none Follow up - TBD   LOS: 0 days    Wellington Hampshire, [facility] 09/18/2020, 8:41 AM Please see Amion for pager number during day hours 7:00am-4:30pm

## 2020-09-18 NOTE — Progress Notes (Signed)
  Referral received.  A1C=6.4% indicating pre-diabetes. Discussed A1C with patient.  She states that she was told in the past that A1C was high and she eliminated sugar from beverages.  A1C in 2021 was 6.7% so this is actually an improvement for patient.  Encouraged close f/u with PCP.  Patient appreciative.   Thanks  Adah Perl, RN, BC-ADM Inpatient Diabetes Coordinator Pager [phone] (8a-5p)

## 2020-09-18 NOTE — Progress Notes (Signed)
Initial Nutrition Assessment  INTERVENTION:   Once diet advanced:  -Ensure Enlive po BID, each supplement provides 350 kcal and 20 grams of protein -Multivitamin with minerals daily  NUTRITION DIAGNOSIS:   Inadequate oral intake related to nausea,vomiting as evidenced by per patient/family report.  GOAL:   Patient will meet greater than or equal to 90% of their needs  MONITOR:   PO intake,Supplement acceptance,Weight trends,I & O's,Labs  REASON FOR ASSESSMENT:   Malnutrition Screening Tool    ASSESSMENT:   65 y.o. female who is here for a 2 wk h/o RLQ pain, nausea and diarrhea.  This became persistent and her PCP ordered a CT scan.  This showed possible appendicitis.  Patient currently on ice chips only per RN. Pt has been having N/V for 2 weeks now and is having loose stools for a week. Pt has been unable to keep food down. Last meal was a chef's salad 3/23.  Per surgery note, pt to remain on bowel rest for the next 24 hours. Possible plan is surgery this admission vs in 6-8 weeks.  Pt agreeable to trying supplements once diet is advanced.  Pt reports UBW is 175 lbs. States she lost weight from 190 lbs d/t a new medication. Per weight records, pt has lost 6 lbs. Pt at risk of malnutrition if PO cannot improve.  Medications: D5 infusion, KCl  Labs reviewed: Low K  NUTRITION - FOCUSED PHYSICAL EXAM:  No depletions noted.  Diet Order:   Diet Order            Diet clear liquid Room service appropriate? Yes  Diet effective now                 EDUCATION NEEDS:   No education needs have been identified at this time  Skin:  Skin Assessment: Reviewed RN Assessment  Last BM:  4/1 -type 7  Height:   Ht Readings from Last 1 Encounters:  09/17/20 5\' 6"  (1.676 m)    Weight:   Wt Readings from Last 1 Encounters:  09/17/20 76.7 kg   BMI:  Body mass index is 27.28 kg/m.  Estimated Nutritional Needs:   Kcal:  1800-2000  Protein:  75-90g  Fluid:   2L/day  Clayton Bibles, MS, RD, LDN Inpatient Clinical Dietitian Contact information available via Amion

## 2020-09-19 LAB — BASIC METABOLIC PANEL
Anion gap: 6 (ref 5–15)
BUN: <5 mg/dL — ABNORMAL LOW (ref 8–23)
CO2: 26 mmol/L (ref 22–32)
Calcium: 8.5 mg/dL — ABNORMAL LOW (ref 8.9–10.3)
Chloride: 108 mmol/L (ref 98–111)
Creatinine, Ser: 0.73 mg/dL (ref 0.44–1.00)
GFR, Estimated: >60 mL/min (ref >60)
Glucose, Bld: 108 mg/dL — ABNORMAL HIGH (ref 70–99)
Potassium: 4.2 mmol/L (ref 3.5–5.1)
Sodium: 140 mmol/L (ref 135–145)

## 2020-09-19 LAB — MAGNESIUM: Magnesium: 1.6 mg/dL — ABNORMAL LOW (ref 1.7–2.4)

## 2020-09-19 LAB — CBC
HCT: 31.3 % — ABNORMAL LOW (ref 36.0–46.0)
Hemoglobin: 10 g/dL — ABNORMAL LOW (ref 12.0–15.0)
MCH: 27.8 pg (ref 26.0–34.0)
MCHC: 31.9 g/dL (ref 30.0–36.0)
MCV: 86.9 fL (ref 80.0–100.0)
Platelets: 259 10*3/uL (ref 150–400)
RBC: 3.6 MIL/uL — ABNORMAL LOW (ref 3.87–5.11)
RDW: 15.9 % — ABNORMAL HIGH (ref 11.5–15.5)
WBC: 8.2 10*3/uL (ref 4.0–10.5)
nRBC: 0 % (ref 0.0–0.2)

## 2020-09-19 LAB — GLUCOSE, CAPILLARY
Glucose-Capillary: 105 mg/dL — ABNORMAL HIGH (ref 70–99)
Glucose-Capillary: 67 mg/dL — ABNORMAL LOW (ref 70–99)
Glucose-Capillary: 89 mg/dL (ref 70–99)
Glucose-Capillary: 107 mg/dL — ABNORMAL HIGH (ref 70–99)
Glucose-Capillary: 93 mg/dL (ref 70–99)

## 2020-09-19 NOTE — Progress Notes (Signed)
Subjective/Chief Complaint: No complaints. Feeling better   Objective: Vital signs in last 24 hours: Temp:  [97.6 F (36.4 C)-98.2 F (36.8 C)] 98.1 F (36.7 C) (04/02 0610) Pulse Rate:  [62-72] 63 (04/02 0610) Resp:  [16-18] 16 (04/02 0610) BP: (106-138)/(62-91) 125/62 (04/02 0610) SpO2:  [97 %-100 %] 97 % (04/02 0610) Last BM Date: 09/18/20  Intake/Output from previous day: 04/01 0701 - 04/02 0700 In: 2520 [P.O.:1020; I.V.:1100; IV Piggyback:400] Out: 2101 [Urine:2100; Stool:1] Intake/Output this shift: Total I/O In: -  Out: 750 [Urine:750]  General appearance: alert and cooperative Resp: clear to auscultation bilaterally Cardio: regular rate and rhythm GI: soft, nontender  Lab Results:  Recent Labs    09/18/20 0906 09/19/20 0432  WBC 8.0 8.2  HGB 9.6* 10.0*  HCT 30.2* 31.3*  PLT 244 259   BMET Recent Labs    09/18/20 0906 09/19/20 0432  NA 139 140  K 3.4* 4.2  CL 106 108  CO2 27 26  GLUCOSE 109* 108*  BUN 6* <5*  CREATININE 0.62 0.73  CALCIUM 8.2* 8.5*   PT/INR No results for input(s): LABPROT, INR in the last 72 hours. ABG No results for input(s): PHART, HCO3 in the last 72 hours.  Invalid input(s): PCO2, PO2  Studies/Results: CT CHEST ABDOMEN PELVIS W CONTRAST  Result Date: 09/17/2020 CLINICAL DATA:  Acute nonlocalized abdominal pain for 2 weeks. Right lower quadrant pain. Pulmonary nodule. EXAM: CT CHEST, ABDOMEN, AND PELVIS WITH CONTRAST TECHNIQUE: Multidetector CT imaging of the chest, abdomen and pelvis was performed following the standard protocol during bolus administration of intravenous contrast. CONTRAST:  144mL OMNIPAQUE IOHEXOL 300 MG/ML  SOLN COMPARISON:  February 16, 2011 FINDINGS: CT CHEST FINDINGS Cardiovascular: No significant vascular findings. Normal heart size. No pericardial effusion. Mediastinum/Nodes: No enlarged mediastinal or axillary lymph nodes. Mildly enlarged partially calcified lymph nodes are seen in the bilateral  hilar regions, measuring up to 14 mm in short axis. Thyroid gland, trachea, and esophagus demonstrate no significant findings. Lungs/Pleura: 3 mm perifissural soft tissue nodule in the superior segment of right lower lobe. Several tiny less than 2-3 mm soft tissue nodules in the right lung base. Musculoskeletal: No chest wall mass or suspicious bone lesions identified. CT ABDOMEN PELVIS FINDINGS Hepatobiliary: 8 mm too small to be actually characterized well circumscribed nodule the right lobe of the liver. Normal gallbladder. Pancreas: Unremarkable. No pancreatic ductal dilatation or surrounding inflammatory changes. Spleen: Normal in size without focal abnormality. Adrenals/Urinary Tract: Adrenal glands are unremarkable. Kidneys are normal, without renal calculi, focal lesion, or hydronephrosis. Bladder is unremarkable. Stomach/Bowel: Stomach is within normal limits. The appendix measures 8 mm in cross-section with mild periappendiceal fat stranding, particularly at the tip the appendix. Otherwise no evidence of bowel wall thickening, distention, or inflammatory changes. Left colonic diverticulosis without evidence of diverticulitis. Vascular/Lymphatic: No significant vascular findings are present. No enlarged abdominal or pelvic lymph nodes. Reproductive: Uterus and bilateral adnexa are unremarkable. Other: No abdominal wall hernia or abnormality. No abdominopelvic ascites. Musculoskeletal: Spondylosis lower lumbosacral spine. No acute osseous findings. IMPRESSION: 1. Mildly prominent size of the appendix with mild periappendiceal fat stranding. These findings are inconclusive for, but may be seen with early tip appendicitis. 2. Mildly enlarged partially calcified bilateral hilar lymph nodes, measuring up to 14 mm in short axis. This may represent sequela of prior granulomatous disease. 3. 3 mm perifissural soft tissue nodule in the right lower lobe, possibly representing intraparenchymal lymph node. No follow-up  needed if patient is low-risk. Non-contrast  chest CT can be considered in 12 months if patient is high-risk. This recommendation follows the consensus statement: Guidelines for Management of Incidental Pulmonary Nodules Detected on CT Images: From the Fleischner Society 2017; Radiology 2017; 284:228-243. 4. Several tiny less than 2-3 mm soft tissue nodules in the right lung base. These are of doubtful clinical significance. 5. 8 mm too small to be actually characterized well circumscribed nodule in the right lobe of the liver, likely benign. 6. Left colonic diverticulosis without evidence of diverticulitis. Electronically Signed   By: Fidela Salisbury M.D.   On: 09/17/2020 14:40    Anti-infectives: Anti-infectives (From admission, onward)   Start     Dose/Rate Route Frequency Ordered Stop   09/17/20 2200  metroNIDAZOLE (FLAGYL) IVPB 500 mg       "And" Linked Group Details   500 mg 100 mL/hr over 60 Minutes Intravenous Every 8 hours 09/17/20 2016     09/17/20 2100  cefTRIAXone (ROCEPHIN) 2 g in sodium chloride 0.9 % 100 mL IVPB       "And" Linked Group Details   2 g 200 mL/hr over 30 Minutes Intravenous Every 24 hours 09/17/20 2016        Assessment/Plan: s/p * No surgery found * Advance diet. Start fulls today Continue IV rocephin and flagyl Ambulate Appendicitis improving with nonoperative management. Plan for interval appendectomy at a later date  LOS: 1 day    Autumn Messing III 09/19/2020

## 2020-09-19 NOTE — Progress Notes (Signed)
Patient had CBG of 67 and was given apple juice.  CBG rechecked 15 minutes later and was 89.

## 2020-09-20 LAB — GLUCOSE, CAPILLARY
Glucose-Capillary: 84 mg/dL (ref 70–99)
Glucose-Capillary: 135 mg/dL — ABNORMAL HIGH (ref 70–99)
Glucose-Capillary: 111 mg/dL — ABNORMAL HIGH (ref 70–99)
Glucose-Capillary: 98 mg/dL (ref 70–99)

## 2020-09-20 MED ORDER — AMOXICILLIN-POT CLAVULANATE 875-125 MG PO TABS
1.0000 | ORAL_TABLET | Freq: Two times a day (BID) | ORAL | Status: DC
  Administered 2020-09-20 – 2020-09-21 (×3): 1 via ORAL
  Filled 2020-09-20 (×3): qty 1

## 2020-09-20 NOTE — Progress Notes (Signed)
   Subjective/Chief Complaint: No complaints   Objective: Vital signs in last 24 hours: Temp:  [97.7 F (36.5 C)-98.3 F (36.8 C)] 98.3 F (36.8 C) (04/03 0556) Pulse Rate:  [61-65] 62 (04/03 0844) Resp:  [14-17] 14 (04/03 0844) BP: (96-137)/(71-87) 113/71 (04/03 0844) SpO2:  [97 %-100 %] 98 % (04/03 0844) Last BM Date: 09/19/20  Intake/Output from previous day: 04/02 0701 - 04/03 0700 In: 3163.3 [P.O.:1380; I.V.:1327.5; IV Piggyback:455.8] Out: 3600 [Urine:3600] Intake/Output this shift: Total I/O In: -  Out: 500 [Urine:500]  General appearance: alert and cooperative Resp: clear to auscultation bilaterally Cardio: regular rate and rhythm GI: soft, nontender  Lab Results:  Recent Labs    09/18/20 0906 09/19/20 0432  WBC 8.0 8.2  HGB 9.6* 10.0*  HCT 30.2* 31.3*  PLT 244 259   BMET Recent Labs    09/18/20 0906 09/19/20 0432  NA 139 140  K 3.4* 4.2  CL 106 108  CO2 27 26  GLUCOSE 109* 108*  BUN 6* <5*  CREATININE 0.62 0.73  CALCIUM 8.2* 8.5*   PT/INR No results for input(s): LABPROT, INR in the last 72 hours. ABG No results for input(s): PHART, HCO3 in the last 72 hours.  Invalid input(s): PCO2, PO2  Studies/Results: No results found.  Anti-infectives: Anti-infectives (From admission, onward)   Start     Dose/Rate Route Frequency Ordered Stop   09/20/20 1000  amoxicillin-clavulanate (AUGMENTIN) 875-125 MG per tablet 1 tablet        1 tablet Oral Every 12 hours 09/20/20 0854     09/17/20 2200  metroNIDAZOLE (FLAGYL) IVPB 500 mg  Status:  Discontinued       "And" Linked Group Details   500 mg 100 mL/hr over 60 Minutes Intravenous Every 8 hours 09/17/20 2016 09/20/20 0854   09/17/20 2100  cefTRIAXone (ROCEPHIN) 2 g in sodium chloride 0.9 % 100 mL IVPB  Status:  Discontinued       "And" Linked Group Details   2 g 200 mL/hr over 30 Minutes Intravenous Every 24 hours 09/17/20 2016 09/20/20 0854      Assessment/Plan: s/p * No surgery found  * Advance diet. Start soft foods today Switch to oral abx If she tolerates this then may be able to go home tomorrow Appendicitis improving with medical management. Will follow up with Dr. [name] for interval appendectomy  LOS: 2 days    Jaime [name] 09/20/2020

## 2020-09-21 LAB — HEMOGLOBINOPATHY EVALUATION
Fetal Hemoglobin Testing: <1 % (ref 0.0–1.9)
HCT: 36.6 % (ref 35.0–45.0)
Hemoglobin A2 - HGBRFX: 2.2 % (ref 2.2–3.2)
Hemoglobin: 11.2 g/dL — ABNORMAL LOW (ref 11.7–15.5)
Hgb A: 97.8 % (ref >96.0)
MCH: 27.1 pg (ref 27.0–33.0)
MCV: 88.4 fL (ref 80.0–100.0)
RBC: 4.14 10*6/uL (ref 3.80–5.10)
RDW: 14.7 % (ref 11.0–15.0)

## 2020-09-21 LAB — GLUCOSE, CAPILLARY: Glucose-Capillary: 91 mg/dL (ref 70–99)

## 2020-09-21 MED ORDER — AMOXICILLIN-POT CLAVULANATE 875-125 MG PO TABS: 1.0000 | ORAL_TABLET | Freq: Two times a day (BID) | ORAL | 0 refills | Status: AC

## 2020-09-21 MED ORDER — ENSURE ENLIVE PO LIQD
237.0000 mL | Freq: Two times a day (BID) | ORAL | Status: DC
  Administered 2020-09-21: 237 mL via ORAL

## 2020-09-21 NOTE — Progress Notes (Signed)
D/C instructions given to patient. Patient has no questions. NT or writer will wheel patient out once she is dressed

## 2020-09-21 NOTE — Discharge Instructions (Signed)
  [facility] -- DISCHARGE INSTRUCTIONS  REMINDER:   Carry a list of your medications and allergies with you at all times  Call your pharmacy at least 1 week in advance to refill prescriptions  Do not mix any prescribed pain medicine with alcohol  Do not drive any motor vehicles while taking pain medication  Take medications with food unless otherwise directed  Follow-up appointments (date to return to physician): Please call [phone] to confirm your follow up appointment with your surgeon.  Call your Surgeon if you have:  Temperature greater than 101.0  Persistent nausea and vomiting  Severe uncontrolled pain  Redness, tenderness, or signs of infection (pain, swelling, redness, odor or green/yellow discharge around the site)  Difficulty breathing, headache or visual disturbances  Hives  Persistent dizziness or light-headedness  Any other questions or concerns you may have after discharge  In an emergency, call 911 or go to an Emergency Department at a nearby hospital.  Diet: Begin with liquids, and if they are tolerated, resume your usual diet.  Avoid spicy, greasy or heavy foods.  If you have nausea or vomiting, go back to liquids.  If you cannot keep liquids down, call your doctor.  Avoid alcohol consumption while on prescription pain medications. Good nutrition promotes healing. Increase fiber and fluids.   ADDITIONAL INSTRUCTIONS: Take home meds as usual including aspirin.  Take Augmentin antibiotic until gone.  Eat a daily yogurt (Dannon) while on antibiotic.  [facility] Office: [phone]

## 2020-09-21 NOTE — Discharge Summary (Signed)
    Physician Discharge Summary [facility]  Patient ID: SAREA FYFE MRN: [id number] DOB/AGE: [birth year] 64 y.o.  Admit date: 09/17/2020  Discharge date: 09/21/2020  Discharge Diagnoses:  Active Problems:   Appendicitis   Discharged Condition: good  Hospital Course: Patient was admitted with perforated acute appendicitis.  Hospital course was uncomplicated.  Pain was well controlled.  Tolerated diet.  IV abx's were converted to oral antibiotics and regular diet tolerated.  Prepared for discharge on HD#5.  Consults: None  Treatments: no surgery.  IV antibiotics.  Discharge Exam: Blood pressure 128/70, pulse 62, temperature 98.1 F (36.7 C), temperature source Oral, resp. rate 18, height 5\' 6"  (1.676 m), weight 76.7 kg, SpO2 99 %. HEENT - clear Neck - soft Chest - clear bilaterally Cor - RRR Abd - soft without distension; non-tender; no mass  Disposition: Home  Discharge Instructions    Diet - low sodium heart healthy   Complete by: As directed    Increase activity slowly   Complete by: As directed    No wound care   Complete by: As directed        Follow-up Information    Clovis Riley, MD. Schedule an appointment as soon as possible for a visit in 2 week(s).   Specialty: General Surgery Contact information: [address] [place] [address] [phone]               Armandina Gemma, [facility] Office: [phone]   Signed: Armandina Gemma 09/21/2020, 9:04 AM

## 2020-09-22 ENCOUNTER — Telehealth: Payer: Self-pay

## 2020-09-22 NOTE — Telephone Encounter (Signed)
Transition Care Management Unsuccessful Follow-up Telephone Call  Date of discharge and from where:  09/21/2020 from [facility]  Attempts:  1st Attempt  Reason for unsuccessful TCM follow-up call:  Unable to leave message  Nurse Note: Attempted to contact patient to discuss transition of care from inpatient admission.  Patient did not answer the phone.  Unable to leave voicemail as it was not set up.  Will attempt to call again or follow up. This nurse attempted to call both emergency contacts per DPR and was able to speak with only one to relay message to patient.

## 2020-10-01 ENCOUNTER — Other Ambulatory Visit: Payer: Self-pay

## 2020-10-01 ENCOUNTER — Telehealth: Payer: Self-pay | Admitting: Internal Medicine

## 2020-10-01 ENCOUNTER — Ambulatory Visit (INDEPENDENT_AMBULATORY_CARE_PROVIDER_SITE_OTHER): Payer: 59 | Admitting: Internal Medicine

## 2020-10-01 ENCOUNTER — Encounter: Payer: Self-pay | Admitting: Internal Medicine

## 2020-10-01 VITALS — BP 120/74 | HR 71 | Temp 98.5°F | Ht 66.0 in | Wt 174.2 lb

## 2020-10-01 DIAGNOSIS — K3580 Unspecified acute appendicitis: Secondary | ICD-10-CM

## 2020-10-01 DIAGNOSIS — I10 Essential (primary) hypertension: Secondary | ICD-10-CM

## 2020-10-01 DIAGNOSIS — E118 Type 2 diabetes mellitus with unspecified complications: Secondary | ICD-10-CM | POA: Diagnosis not present

## 2020-10-01 NOTE — Telephone Encounter (Signed)
Patient called and said that she was admitted to the hospital 09-17-20. She said she stayed around 5 days. They gave her an antibiotic and told her to take all her medicines as prescribed but she was advised to stop taking Synjardy while taking amoxicillin-clavulanate (AUGMENTIN) 875-125 MG tablet. She said that she has finished the antibiotic and was asking if she could resume taking Synjardy. She can be reached at [phone]. Please advise

## 2020-10-01 NOTE — Progress Notes (Signed)
Subjective:  Patient ID: Jaime [name], female    DOB: 08/31/1955  Age: 65 y.o. MRN: [id number]  CC: Diabetes  This visit occurred during the SARS-CoV-2 public health emergency.  Safety protocols were in place, including screening questions prior to the visit, additional usage of staff PPE, and extensive cleaning of exam room while observing appropriate contact time as indicated for disinfecting solutions.    HPI Jaime [name] presents for f/up - She was recently admitted for appendicitis.  She was treated with IV and then oral antibiotics.  She will consider having an appendectomy later this month.  She tells me the abdominal pain has resolved and she denies nausea, vomiting, fever, chills, loss of appetite, dysuria, or hematuria.  She tells me during the admission she was told to stop taking Synjardy.  She tells me her blood sugars have been well controlled.  Outpatient Medications Prior to Visit  Medication Sig Dispense Refill  . amLODipine (NORVASC) 5 MG tablet Take 5 mg by mouth daily.    Marland Kitchen amoxicillin-clavulanate (AUGMENTIN) 875-125 MG tablet Take 1 tablet by mouth every 12 (twelve) hours for 10 days. 20 tablet 0  . aspirin 325 MG tablet Take 325 mg by mouth daily.     Marland Kitchen atorvastatin (LIPITOR) 40 MG tablet Take 1 tablet (40 mg total) by mouth daily. 90 tablet 3  . losartan (COZAAR) 100 MG tablet Take 100 mg by mouth daily.    . metoprolol succinate (TOPROL XL) 25 MG 24 hr tablet Take 1 tablet (25 mg total) by mouth daily after supper. 90 tablet 3  . Multiple Vitamins-Minerals (MULTIVITAMINS THER. W/MINERALS) TABS Take 1 tablet by mouth daily as needed (nutrition).    . pantoprazole (PROTONIX) 40 MG tablet Take 1 tablet (40 mg total) by mouth every morning. 90 tablet 1   No facility-administered medications prior to visit.    ROS Review of Systems  Constitutional: Negative for chills, fatigue and fever.  HENT: Negative.   Eyes: Negative.   Respiratory: Negative for cough,  chest tightness, shortness of breath and wheezing.   Cardiovascular: Negative for chest pain, palpitations and leg swelling.  Gastrointestinal: Negative for abdominal pain, diarrhea, nausea and vomiting.  Endocrine: Negative.   Genitourinary: Negative.  Negative for difficulty urinating.  Musculoskeletal: Negative for arthralgias and myalgias.  Skin: Negative.  Negative for color change and pallor.  Neurological: Negative.  Negative for dizziness, weakness and light-headedness.  Hematological: Negative for adenopathy. Does not bruise/bleed easily.  Psychiatric/Behavioral: Negative.     Objective:  BP 120/74 (BP Location: Right Arm, Patient Position: Sitting, Cuff Size: Large)   Pulse 71   Temp 98.5 F (36.9 C) (Oral)   Ht 5\' 6"  (1.676 m)   Wt 174 lb 3.2 oz (79 kg)   SpO2 98%   BMI 28.12 kg/m   BP Readings from Last 3 Encounters:  10/01/20 120/74  09/21/20 128/70  09/16/20 124/82    Wt Readings from Last 3 Encounters:  10/01/20 174 lb 3.2 oz (79 kg)  09/17/20 169 lb (76.7 kg)  09/16/20 169 lb (76.7 kg)    Physical Exam Vitals reviewed.  Constitutional:      General: She is not in acute distress.    Appearance: She is not ill-appearing, toxic-appearing or diaphoretic.  HENT:     Mouth/Throat:     Mouth: Mucous membranes are moist.  Eyes:     General: No scleral icterus.    Conjunctiva/sclera: Conjunctivae normal.  Cardiovascular:     Rate  and Rhythm: Normal rate and regular rhythm.     Heart sounds: No murmur heard.   Pulmonary:     Effort: Pulmonary effort is normal.     Breath sounds: No stridor. No wheezing, rhonchi or rales.  Abdominal:     General: Abdomen is flat. Bowel sounds are normal. There is no distension.     Palpations: Abdomen is soft. There is no hepatomegaly, splenomegaly or mass.     Tenderness: There is no abdominal tenderness.  Musculoskeletal:        General: Normal range of motion.     Cervical back: Neck supple.     Left lower leg: No  edema.  Lymphadenopathy:     Cervical: No cervical adenopathy.  Skin:    General: Skin is warm and dry.  Neurological:     General: No focal deficit present.     Mental Status: She is alert.  Psychiatric:        Mood and Affect: Mood normal.        Behavior: Behavior normal.     Lab Results  Component Value Date   WBC 8.2 09/19/2020   HGB 10.0 (L) 09/19/2020   HCT 31.3 (L) 09/19/2020   PLT 259 09/19/2020   GLUCOSE 108 (H) 09/19/2020   CHOL 139 03/02/2020   TRIG 72 03/02/2020   HDL 41 (L) 03/02/2020   LDLCALC 83 03/02/2020   ALT 10 09/17/2020   AST 15 09/17/2020   NA 140 09/19/2020   K 4.2 09/19/2020   CL 108 09/19/2020   CREATININE 0.73 09/19/2020   BUN <5 (L) 09/19/2020   CO2 26 09/19/2020   TSH 2.19 03/02/2020   HGBA1C 6.4 (H) 09/18/2020   MICROALBUR 2.5 03/02/2020    CT CHEST ABDOMEN PELVIS W CONTRAST  Result Date: 09/17/2020 CLINICAL DATA:  Acute nonlocalized abdominal pain for 2 weeks. Right lower quadrant pain. Pulmonary nodule. EXAM: CT CHEST, ABDOMEN, AND PELVIS WITH CONTRAST TECHNIQUE: Multidetector CT imaging of the chest, abdomen and pelvis was performed following the standard protocol during bolus administration of intravenous contrast. CONTRAST:  139mL OMNIPAQUE IOHEXOL 300 MG/ML  SOLN COMPARISON:  February 16, 2011 FINDINGS: CT CHEST FINDINGS Cardiovascular: No significant vascular findings. Normal heart size. No pericardial effusion. Mediastinum/Nodes: No enlarged mediastinal or axillary lymph nodes. Mildly enlarged partially calcified lymph nodes are seen in the bilateral hilar regions, measuring up to 14 mm in short axis. Thyroid gland, trachea, and esophagus demonstrate no significant findings. Lungs/Pleura: 3 mm perifissural soft tissue nodule in the superior segment of right lower lobe. Several tiny less than 2-3 mm soft tissue nodules in the right lung base. Musculoskeletal: No chest wall mass or suspicious bone lesions identified. CT ABDOMEN PELVIS FINDINGS  Hepatobiliary: 8 mm too small to be actually characterized well circumscribed nodule the right lobe of the liver. Normal gallbladder. Pancreas: Unremarkable. No pancreatic ductal dilatation or surrounding inflammatory changes. Spleen: Normal in size without focal abnormality. Adrenals/Urinary Tract: Adrenal glands are unremarkable. Kidneys are normal, without renal calculi, focal lesion, or hydronephrosis. Bladder is unremarkable. Stomach/Bowel: Stomach is within normal limits. The appendix measures 8 mm in cross-section with mild periappendiceal fat stranding, particularly at the tip the appendix. Otherwise no evidence of bowel wall thickening, distention, or inflammatory changes. Left colonic diverticulosis without evidence of diverticulitis. Vascular/Lymphatic: No significant vascular findings are present. No enlarged abdominal or pelvic lymph nodes. Reproductive: Uterus and bilateral adnexa are unremarkable. Other: No abdominal wall hernia or abnormality. No abdominopelvic ascites. Musculoskeletal: Spondylosis lower lumbosacral  spine. No acute osseous findings. IMPRESSION: 1. Mildly prominent size of the appendix with mild periappendiceal fat stranding. These findings are inconclusive for, but may be seen with early tip appendicitis. 2. Mildly enlarged partially calcified bilateral hilar lymph nodes, measuring up to 14 mm in short axis. This may represent sequela of prior granulomatous disease. 3. 3 mm perifissural soft tissue nodule in the right lower lobe, possibly representing intraparenchymal lymph node. No follow-up needed if patient is low-risk. Non-contrast chest CT can be considered in 12 months if patient is high-risk. This recommendation follows the consensus statement: Guidelines for Management of Incidental Pulmonary Nodules Detected on CT Images: From the Fleischner Society 2017; Radiology 2017; 284:228-243. 4. Several tiny less than 2-3 mm soft tissue nodules in the right lung base. These are of  doubtful clinical significance. 5. 8 mm too small to be actually characterized well circumscribed nodule in the right lobe of the liver, likely benign. 6. Left colonic diverticulosis without evidence of diverticulitis. Electronically Signed   By: Fidela Salisbury M.D.   On: 09/17/2020 14:40    Assessment & Plan:   Jaime [name] was seen today for diabetes.  Diagnoses and all orders for this visit:  Essential hypertension- Her blood pressure is adequately well controlled.  Acute appendicitis, unspecified acute appendicitis type- Improvement noted.  She will continue the course of Augmentin and will follow up with her general surgeon soon.  Type II diabetes mellitus with manifestations ([place])- Her recent A1c was 6.4%.  Glycemic therapy is not indicated at this time.   I am having Jaime Resides. [name] maintain her multivitamins ther. w/minerals, aspirin, metoprolol succinate, atorvastatin, pantoprazole, amLODipine, and losartan.  No orders of the defined types were placed in this encounter.    Follow-up: No follow-ups on file.  Jaime Calico, MD

## 2020-10-02 ENCOUNTER — Encounter: Payer: Self-pay | Admitting: Internal Medicine

## 2020-10-02 NOTE — Patient Instructions (Signed)
Type 2 Diabetes Mellitus, Diagnosis, Adult Type 2 diabetes (type 2 diabetes mellitus) is a long-term, or chronic, disease. In type 2 diabetes, one or both of these problems may be present:  The pancreas does not make enough of a hormone called insulin.  Cells in the body do not respond properly to insulin that the body makes (insulin resistance). Normally, insulin allows blood sugar (glucose) to enter cells in the body. The cells use glucose for energy. Insulin resistance or lack of insulin causes excess glucose to build up in the blood instead of going into cells. This causes high blood glucose (hyperglycemia).  What are the causes? The exact cause of type 2 diabetes is not known. What increases the risk? The following factors may make you more likely to develop this condition:  Having a family member with type 2 diabetes.  Being overweight or obese.  Being inactive (sedentary).  Having been diagnosed with insulin resistance.  Having a history of prediabetes, diabetes when you were pregnant (gestational diabetes), or polycystic ovary syndrome (PCOS). What are the signs or symptoms? In the early stage of this condition, you may not have symptoms. Symptoms develop slowly and may include:  Increased thirst or hunger.  Increased urination.  Unexplained weight loss.  Tiredness (fatigue) or weakness.  Vision changes, such as blurry vision.  Dark patches on the skin. How is this diagnosed? This condition is diagnosed based on your symptoms, your medical history, a physical exam, and your blood glucose level. Your blood glucose may be checked with one or more of the following blood tests:  A fasting blood glucose (FBG) test. You will not be allowed to eat (you will fast) for 8 hours or longer before a blood sample is taken.  A random blood glucose test. This test checks blood glucose at any time of day regardless of when you ate.  An A1C (hemoglobin A1C) blood test. This test  provides information about blood glucose levels over the previous 2-3 months.  An oral glucose tolerance test (OGTT). This test measures your blood glucose at two times: ? After fasting. This is your baseline blood glucose level. ? Two hours after drinking a beverage that contains glucose. You may be diagnosed with type 2 diabetes if:  Your fasting blood glucose level is 126 mg/dL (7.0 mmol/L) or higher.  Your random blood glucose level is 200 mg/dL (11.1 mmol/L) or higher.  Your A1C level is 6.5% or higher.  Your oral glucose tolerance test result is higher than 200 mg/dL (11.1 mmol/L). These blood tests may be repeated to confirm your diagnosis.   How is this treated? Your treatment may be managed by a specialist called an endocrinologist. Type 2 diabetes may be treated by following instructions from your health care provider about:  Making dietary and lifestyle changes. These may include: ? Following a personalized nutrition plan that is developed by a registered dietitian. ? Exercising regularly. ? Finding ways to manage stress.  Checking your blood glucose level as often as told.  Taking diabetes medicines or insulin daily. This helps to keep your blood glucose levels in the healthy range.  Taking medicines to help prevent complications from diabetes. Medicines may include: ? Aspirin. ? Medicine to lower cholesterol. ? Medicine to control blood pressure. Your health care provider will set treatment goals for you. Your goals will be based on your age, other medical conditions you have, and how you respond to diabetes treatment. Generally, the goal of treatment is to maintain the   following blood glucose levels:  Before meals: 80-130 mg/dL (4.4-7.2 mmol/L).  After meals: below 180 mg/dL (10 mmol/L).  A1C level: less than 7%. Follow these instructions at home: Questions to ask your health care provider Consider asking the following questions:  Should I meet with a certified  diabetes care and education specialist?  What diabetes medicines do I need, and when should I take them?  What equipment will I need to manage my diabetes at home?  How often do I need to check my blood glucose?  Where can I find a support group for people with diabetes?  What number can I call if I have questions?  When is my next appointment? General instructions  Take over-the-counter and prescription medicines only as told by your health care provider.  Keep all follow-up visits as told by your health care provider. This is important. Where to find more information  American Diabetes Association (ADA): www.diabetes.org  American Association of Diabetes Care and Education Specialists (ADCES): www.diabeteseducator.org  International Diabetes Federation (IDF): www.idf.org Contact a health care provider if:  Your blood glucose is at or above 240 mg/dL (13.3 mmol/L) for 2 days in a row.  You have been sick or have had a fever for 2 days or longer, and you are not getting better.  You have any of the following problems for more than 6 hours: ? You cannot eat or drink. ? You have nausea and vomiting. ? You have diarrhea. Get help right away if:  You have severe hypoglycemia. This means your blood glucose is lower than 54 mg/dL (3.0 mmol/L).  You become confused or you have trouble thinking clearly.  You have difficulty breathing.  You have moderate or large ketone levels in your urine. These symptoms may represent a serious problem that is an emergency. Do not wait to see if the symptoms will go away. Get medical help right away. Call your local emergency services (911 in the [place] Do not drive yourself to the hospital. Summary  Type 2 diabetes (type 2 diabetes mellitus) is a long-term, or chronic, disease. In type 2 diabetes, the pancreas does not make enough of a hormone called insulin, or cells in the body do not respond properly to insulin that the body makes (insulin  resistance).  This condition is treated by making dietary and lifestyle changes and taking diabetes medicines or insulin.  Your health care provider will set treatment goals for you. Your goals will be based on your age, other medical conditions you have, and how you respond to diabetes treatment.  Keep all follow-up visits as told by your health care provider. This is important. This information is not intended to replace advice given to you by your health care provider. Make sure you discuss any questions you have with your health care provider. Document Revised: 01/01/2020 Document Reviewed: 01/01/2020 Elsevier Patient Education  2021 Elsevier Inc.  

## 2020-10-15 ENCOUNTER — Ambulatory Visit: Payer: Self-pay | Admitting: Surgery

## 2020-10-15 DIAGNOSIS — K3580 Unspecified acute appendicitis: Secondary | ICD-10-CM | POA: Diagnosis not present

## 2020-10-15 NOTE — H&P (View-Only) (Signed)
Jaime [name] Appointment: 10/15/2020 9:30 AM Location: [facility] Patient #: [id number] DOB: [birth year] Married / Language: English / Race: Black or African American Female  History of Present Illness (Jaime [name] A. Jaime Heller MD; 10/15/2020 9:51 AM) Patient words: Very pleasant 65 year old woman following up after recent hospitalization for a subacute appendicitis. She was admitted at the beginning of April with a two-week history of right lower quadrant pain. Symptoms improved with antibiotic therapy and she was discharged after about 4 days in hospital. She has been doing well since then but does continue to have intermittent right lower quadrant pain. Presents today to discuss interval appendectomy.  The patient is a 65 year old female.   Past Surgical History Jaime [name], CMA; 10/15/2020 9:24 AM) Foot Surgery Right. Tonsillectomy  Diagnostic Studies History Jaime [name], CMA; 10/15/2020 9:24 AM) Colonoscopy >10 years ago Mammogram within last year Pap Smear 1-5 years ago  Allergies Jaime [name], CMA; 10/15/2020 9:25 AM) No Known Drug Allergies [10/15/2020]: Allergies Reconciled  Medication History Jaime [name], CMA; 10/15/2020 9:25 AM) amLODIPine Besylate (5MG  Tablet, Oral) Active. Metoprolol Succinate ER (25MG  Tablet ER 24HR, Oral) Active. Pantoprazole Sodium (40MG  Tablet DR, Oral) Active. Losartan Potassium (100MG  Tablet, Oral) Active. Atorvastatin Calcium (20MG  Tablet, Oral) Active. SM Non-Asprin Sinus (30-500MG  Tablet, Oral) Active. Medications Reconciled  Social History Jaime [name], CMA; 10/15/2020 9:24 AM) Alcohol use Occasional alcohol use. Caffeine use Tea. No drug use Tobacco use Former smoker.  Family History Jaime [name], [name]; 10/15/2020 9:24 AM) Cancer Father, Mother. Diabetes Mellitus Mother. Hypertension Mother.  Pregnancy / Birth History Jaime [name], CMA; 10/15/2020 9:24 AM) Age at menarche 89 years. Contraceptive History Oral  contraceptives. Gravida 1 Maternal age 53-25 Para 65  Other Problems Jaime [name], CMA; 10/15/2020 9:24 AM) Diverticulosis Gastroesophageal Reflux Disease High blood pressure Other disease, cancer, significant illness     Review of Systems (Jaime [name] A. Jaime Heller MD; 10/15/2020 9:51 AM) General Present- Night Sweats. Not Present- Appetite Loss, Chills, Fatigue, Fever, Weight Gain and Weight Loss. Skin Not Present- Change in Wart/Mole, Dryness, Hives, Jaundice, New Lesions, Non-Healing Wounds, Rash and Ulcer. HEENT Present- Wears glasses/contact lenses. Not Present- Earache, Hearing Loss, Hoarseness, Nose Bleed, Oral Ulcers, Ringing in the Ears, Seasonal Allergies, Sinus Pain, Sore Throat, Visual Disturbances and Yellow Eyes. Respiratory Not Present- Bloody sputum, Chronic Cough, Difficulty Breathing, Snoring and Wheezing. Breast Not Present- Breast Mass, Breast Pain, Nipple Discharge and Skin Changes. Cardiovascular Present- Shortness of Breath. Not Present- Chest Pain, Difficulty Breathing Lying Down, Leg Cramps, Palpitations, Rapid Heart Rate and Swelling of Extremities. Gastrointestinal Present- Abdominal Pain. Not Present- Bloating, Bloody Stool, Change in Bowel Habits, Chronic diarrhea, Constipation, Difficulty Swallowing, Excessive gas, Gets full quickly at meals, Hemorrhoids, Indigestion, Nausea, Rectal Pain and Vomiting. Female Genitourinary Not Present- Frequency, Nocturia, Painful Urination, Pelvic Pain and Urgency. Musculoskeletal Not Present- Back Pain, Joint Pain, Joint Stiffness, Muscle Pain, Muscle Weakness and Swelling of Extremities. Neurological Not Present- Decreased Memory, Fainting, Headaches, Numbness, Seizures, Tingling, Tremor, Trouble walking and Weakness. Psychiatric Not Present- Anxiety, Bipolar, Change in Sleep Pattern, Depression, Fearful and Frequent crying. Endocrine Present- Hot flashes. Not Present- Cold Intolerance, Excessive Hunger, Hair Changes, Heat  Intolerance and New Diabetes. Hematology Present- Easy Bruising. Not Present- Blood Thinners, Excessive bleeding, Gland problems, HIV and Persistent Infections. All other systems negative  Vitals Jaime [name] CMA; 10/15/2020 9:26 AM) 10/15/2020 9:25 AM Weight: 175.5 lb Height: 66in Body Surface Area: 1.89 m Body Mass Index: 28.33 kg/m  Temp.: 97.32F  Pulse: 73 (Regular)  P.OX: 97% (  Room air) BP: 118/78(Sitting, Left Arm, Standard)        Physical Exam (Jaime [name] A. Jaime Heller MD; 10/15/2020 9:51 AM)  The physical exam findings are as follows: Note:Alert, well-appearing MAB respiration Abdomen soft and nontender No lower extremity edema    Assessment & Plan (Jaime [name] A. Jaime Gutterman MD; 10/15/2020 9:53 AM)  APPENDICITIS (K37) Story: Unusual presentation with a two-week prodrome of right lower quadrant pain, continued to have some intermittent right lower quadrant pain now. I recommend proceeding with laparoscopic appendectomy in 2-4 weeks/ mid May, which will be at least 6 weeks from the initial presentation and treatment with antibiotics. I discussed the technical aspects of the surgery and we went over the relevant anatomy using a diagram to demonstrate. We discussed the risks of bleeding, infection, pain, scarring, intra-abdominal injury, staple line leak or abscess, persistent abdominal pain, conversion to open surgery and more extensive resection, as well as cardiovascular/thromboembolic/pulmonary risks of undergoing general anesthesia. Anticipate an outpatient procedure depending on intraoperative findings. Questions were welcomed and answered to her satisfaction and she wishes to proceed. We will request cardiac clearance and plan to hold her aspirin for 7 days preop.

## 2020-10-15 NOTE — H&P (Signed)
Jaime [name] Appointment: 10/15/2020 9:30 AM Location: [facility] Patient #: [id number] DOB: 1956-03-27 Married / Language: English / Race: Black or African American Female  History of Present Illness (Jaime Kincaid A. [name] MD; 10/15/2020 9:51 AM) Patient words: Very pleasant 65 year old woman following up after recent hospitalization for a subacute appendicitis. She was admitted at the beginning of April with a two-week history of right lower quadrant pain. Symptoms improved with antibiotic therapy and she was discharged after about 4 days in hospital. She has been doing well since then but does continue to have intermittent right lower quadrant pain. Presents today to discuss interval appendectomy.  The patient is a 65 year old female.   Past Surgical History Jaime [name], CMA; 10/15/2020 9:24 AM) Foot Surgery Right. Tonsillectomy  Diagnostic Studies History Jaime [name], CMA; 10/15/2020 9:24 AM) Colonoscopy >10 years ago Mammogram within last year Pap Smear 1-5 years ago  Allergies Jaime [name], CMA; 10/15/2020 9:25 AM) No Known Drug Allergies [10/15/2020]: Allergies Reconciled  Medication History Jaime [name], CMA; 10/15/2020 9:25 AM) amLODIPine Besylate (5MG  Tablet, Oral) Active. Metoprolol Succinate ER (25MG  Tablet ER 24HR, Oral) Active. Pantoprazole Sodium (40MG  Tablet DR, Oral) Active. Losartan Potassium (100MG  Tablet, Oral) Active. Atorvastatin Calcium (20MG  Tablet, Oral) Active. SM Non-Asprin Sinus (30-500MG  Tablet, Oral) Active. Medications Reconciled  Social History Jaime [name], CMA; 10/15/2020 9:24 AM) Alcohol use Occasional alcohol use. Caffeine use Tea. No drug use Tobacco use Former smoker.  Family History Jaime [name], [place]; 10/15/2020 9:24 AM) Cancer Father, Mother. Diabetes Mellitus Mother. Hypertension Mother.  Pregnancy / Birth History Jaime [name], CMA; 10/15/2020 9:24 AM) Age at menarche 41 years. Contraceptive History Oral  contraceptives. Gravida 1 Maternal age 18-25 Para 70  Other Problems Jaime [name], CMA; 10/15/2020 9:24 AM) Diverticulosis Gastroesophageal Reflux Disease High blood pressure Other disease, cancer, significant illness     Review of Systems (Jaime [name] A. [name] MD; 10/15/2020 9:51 AM) General Present- Night Sweats. Not Present- Appetite Loss, Chills, Fatigue, Fever, Weight Gain and Weight Loss. Skin Not Present- Change in Wart/Mole, Dryness, Hives, Jaundice, New Lesions, Non-Healing Wounds, Rash and Ulcer. HEENT Present- Wears glasses/contact lenses. Not Present- Earache, Hearing Loss, Hoarseness, Nose Bleed, Oral Ulcers, Ringing in the Ears, Seasonal Allergies, Sinus Pain, Sore Throat, Visual Disturbances and Yellow Eyes. Respiratory Not Present- Bloody sputum, Chronic Cough, Difficulty Breathing, Snoring and Wheezing. Breast Not Present- Breast Mass, Breast Pain, Nipple Discharge and Skin Changes. Cardiovascular Present- Shortness of Breath. Not Present- Chest Pain, Difficulty Breathing Lying Down, Leg Cramps, Palpitations, Rapid Heart Rate and Swelling of Extremities. Gastrointestinal Present- Abdominal Pain. Not Present- Bloating, Bloody Stool, Change in Bowel Habits, Chronic diarrhea, Constipation, Difficulty Swallowing, Excessive gas, Gets full quickly at meals, Hemorrhoids, Indigestion, Nausea, Rectal Pain and Vomiting. Female Genitourinary Not Present- Frequency, Nocturia, Painful Urination, Pelvic Pain and Urgency. Musculoskeletal Not Present- Back Pain, Joint Pain, Joint Stiffness, Muscle Pain, Muscle Weakness and Swelling of Extremities. Neurological Not Present- Decreased Memory, Fainting, Headaches, Numbness, Seizures, Tingling, Tremor, Trouble walking and Weakness. Psychiatric Not Present- Anxiety, Bipolar, Change in Sleep Pattern, Depression, Fearful and Frequent crying. Endocrine Present- Hot flashes. Not Present- Cold Intolerance, Excessive Hunger, Hair Changes, Heat  Intolerance and New Diabetes. Hematology Present- Easy Bruising. Not Present- Blood Thinners, Excessive bleeding, Gland problems, HIV and Persistent Infections. All other systems negative  Vitals Jaime [name] CMA; 10/15/2020 9:26 AM) 10/15/2020 9:25 AM Weight: 175.5 lb Height: 66in Body Surface Area: 1.89 m Body Mass Index: 28.33 kg/m  Temp.: 97.66F  Pulse: 73 (Regular)  P.OX: 97% (  Room air) BP: 118/78(Sitting, Left Arm, Standard)        Physical Exam (Jaime [name] A. [name] MD; 10/15/2020 9:51 AM)  The physical exam findings are as follows: Note:Alert, well-appearing Jaime [name] respiration Abdomen soft and nontender No lower extremity edema    Assessment & Plan (Jaime [name] A. Jaime Meyn MD; 10/15/2020 9:53 AM)  APPENDICITIS (K37) Story: Unusual presentation with a two-week prodrome of right lower quadrant pain, continued to have some intermittent right lower quadrant pain now. I recommend proceeding with laparoscopic appendectomy in 2-4 weeks/ mid May, which will be at least 6 weeks from the initial presentation and treatment with antibiotics. I discussed the technical aspects of the surgery and we went over the relevant anatomy using a diagram to demonstrate. We discussed the risks of bleeding, infection, pain, scarring, intra-abdominal injury, staple line leak or abscess, persistent abdominal pain, conversion to open surgery and more extensive resection, as well as cardiovascular/thromboembolic/pulmonary risks of undergoing general anesthesia. Anticipate an outpatient procedure depending on intraoperative findings. Questions were welcomed and answered to her satisfaction and she wishes to proceed. We will request cardiac clearance and plan to hold her aspirin for 7 days preop.

## 2020-10-26 NOTE — Progress Notes (Signed)
DUE TO COVID-19 ONLY ONE VISITOR IS ALLOWED TO COME WITH YOU AND STAY IN THE WAITING ROOM ONLY DURING PRE OP AND PROCEDURE DAY OF SURGERY. THE 1 VISITOR  MAY VISIT WITH YOU AFTER SURGERY IN YOUR PRIVATE ROOM DURING VISITING HOURS ONLY!  YOU NEED TO HAVE A COVID 19 TEST ON_5/12/22 ______ @___  12 noon ____, THIS TEST MUST BE DONE BEFORE SURGERY,  COVID TESTING SITE [address]  [id number], IT IS ON THE RIGHT GOING OUT [address] APPROXIMATELY  2 MINUTES PAST [employer] ON THE RIGHT. ONCE YOUR COVID TEST IS COMPLETED,  PLEASE BEGIN THE QUARANTINE INSTRUCTIONS AS OUTLINED IN YOUR HANDOUT.                Jaime [name]  10/26/2020   Your procedure is scheduled on:  11/02/2020   Report to [facility] Main  Entrance   Report to admitting at    [place] AM     Call this number if you have problems the morning of surgery [phone]    Remember: Do not eat food , candy gum or mints :After Midnight. You may have clear liquids from midnight until 0430am     CLEAR LIQUID DIET   Foods Allowed                                                                       Coffee and tea, regular and decaf                              Plain Jell-O any favor except red or purple                                            Fruit ices (not with fruit pulp)                                      Iced Popsicles                                     Carbonated beverages, regular and diet                                    Cranberry, grape and apple juices Sports drinks like Gatorade Lightly seasoned clear broth or consume(fat free) Sugar, honey syrup   _____________________________________________________________________    BRUSH YOUR TEETH MORNING OF SURGERY AND RINSE YOUR MOUTH OUT, NO CHEWING GUM CANDY OR MINTS.     Take these medicines the morning of surgery with A SIP OF WATER:     Amlodipine, protonix  DO NOT TAKE ANY DIABETIC MEDICATIONS DAY OF YOUR SURGERY                                You  may not have any metal on your body including hair pins and              piercings  Do not wear jewelry, make-up, lotions, powders or perfumes, deodorant             Do not wear nail polish on your fingernails.  Do not shave  48 hours prior to surgery.              Men may shave face and neck.   Do not bring valuables to the hospital. [place].  Contacts, dentures or bridgework may not be worn into surgery.  Leave suitcase in the car. After surgery it may be brought to your room.     Patients discharged the day of surgery will not be allowed to drive home. IF YOU ARE HAVING SURGERY AND GOING HOME THE SAME DAY, YOU MUST HAVE AN ADULT TO DRIVE YOU HOME AND BE WITH YOU FOR 24 HOURS. YOU MAY GO HOME BY TAXI OR UBER OR ORTHERWISE, BUT AN ADULT MUST ACCOMPANY YOU HOME AND STAY WITH YOU FOR 24 HOURS.  Name and phone number of your driver:  Special Instructions: N/A              Please read over the following fact sheets you were given: _____________________________________________________________________  [facility] - Preparing for Surgery Before surgery, you can play an important role.  Because skin is not sterile, your skin needs to be as free of germs as possible.  You can reduce the number of germs on your skin by washing with CHG (chlorahexidine gluconate) soap before surgery.  CHG is an antiseptic cleaner which kills germs and bonds with the skin to continue killing germs even after washing. Please DO NOT use if you have an allergy to CHG or antibacterial soaps.  If your skin becomes reddened/irritated stop using the CHG and inform your nurse when you arrive at Short Stay. Do not shave (including legs and underarms) for at least 48 hours prior to the first CHG shower.  You may shave your face/neck. Please follow these instructions carefully:  1.  Shower with CHG Soap the night before surgery and the  morning of  Surgery.  2.  If you choose to wash your hair, wash your hair first as usual with your  normal  shampoo.  3.  After you shampoo, rinse your hair and body thoroughly to remove the  shampoo.                           4.  Use CHG as you would any other liquid soap.  You can apply chg directly  to the skin and wash                       Gently with a scrungie or clean washcloth.  5.  Apply the CHG Soap to your body ONLY FROM THE NECK DOWN.   Do not use on face/ open                           Wound or open sores. Avoid contact with eyes, ears mouth and genitals (private parts).  Wash face,  Genitals (private parts) with your normal soap.             6.  Wash thoroughly, paying special attention to the area where your surgery  will be performed.  7.  Thoroughly rinse your body with warm water from the neck down.  8.  DO NOT shower/wash with your normal soap after using and rinsing off  the CHG Soap.                9.  Pat yourself dry with a clean towel.            10.  Wear clean pajamas.            11.  Place clean sheets on your bed the night of your first shower and do not  sleep with pets. Day of Surgery : Do not apply any lotions/deodorants the morning of surgery.  Please wear clean clothes to the hospital/surgery center.  FAILURE TO FOLLOW THESE INSTRUCTIONS MAY RESULT IN THE CANCELLATION OF YOUR SURGERY PATIENT SIGNATURE_________________________________  NURSE SIGNATURE__________________________________  ________________________________________________________________________

## 2020-10-28 ENCOUNTER — Encounter (HOSPITAL_COMMUNITY)
Admission: RE | Admit: 2020-10-28 | Discharge: 2020-10-28 | Disposition: A | Discharge status: Home / Self Care | Payer: 59 | Source: Ambulatory Visit | Attending: Surgery | Admitting: Surgery

## 2020-10-28 ENCOUNTER — Encounter (HOSPITAL_COMMUNITY): Payer: Self-pay

## 2020-10-28 ENCOUNTER — Other Ambulatory Visit: Payer: Self-pay

## 2020-10-28 DIAGNOSIS — Z01812 Encounter for preprocedural laboratory examination: Secondary | ICD-10-CM | POA: Diagnosis not present

## 2020-10-28 HISTORY — DX: Anemia, unspecified: D64.9

## 2020-10-28 HISTORY — DX: Dyspnea, unspecified: R06.00

## 2020-10-28 LAB — CBC
HCT: 32.9 % — ABNORMAL LOW (ref 36.0–46.0)
Hemoglobin: 10.5 g/dL — ABNORMAL LOW (ref 12.0–15.0)
MCH: 28 pg (ref 26.0–34.0)
MCHC: 31.9 g/dL (ref 30.0–36.0)
MCV: 87.7 fL (ref 80.0–100.0)
Platelets: 269 10*3/uL (ref 150–400)
RBC: 3.75 MIL/uL — ABNORMAL LOW (ref 3.87–5.11)
RDW: 16.6 % — ABNORMAL HIGH (ref 11.5–15.5)
WBC: 9.5 10*3/uL (ref 4.0–10.5)
nRBC: 0 % (ref 0.0–0.2)

## 2020-10-28 LAB — BASIC METABOLIC PANEL
Anion gap: 6 (ref 5–15)
BUN: 12 mg/dL (ref 8–23)
CO2: 29 mmol/L (ref 22–32)
Calcium: 8.8 mg/dL — ABNORMAL LOW (ref 8.9–10.3)
Chloride: 106 mmol/L (ref 98–111)
Creatinine, Ser: 0.43 mg/dL — ABNORMAL LOW (ref 0.44–1.00)
GFR, Estimated: >60 mL/min (ref >60)
Glucose, Bld: 96 mg/dL (ref 70–99)
Potassium: 3.7 mmol/L (ref 3.5–5.1)
Sodium: 141 mmol/L (ref 135–145)

## 2020-10-28 NOTE — Progress Notes (Addendum)
Anesthesia Review:  PCP: DR Eilleen Kempf- 10/01/20 LOV  Cardiologist :- DR Johney Frame 03/2020 LOV  Chest x-ray : CT chest 09/17/20  09/16/20- IV CXR  EKG : 04/02/20  Echo : 04/21/2020 Stress test: 03/13/20  Cardiac Cath :  Activity level:  Sleep Study/ CPAP : Fasting Blood Sugar :      / Checks Blood Sugar -- times a day:   Blood Thinner/ Instructions /Last Dose: ASA / Instructions/ Last Dose : 09/18/20- [id number] pt reports at preop she was taken off of diabetes med by PCP on 10/01/20.    ASA 325 mg- last dose - 10/27/20 per pt  CBC and BMP done 10/28/20 routed to Dr [name].

## 2020-10-29 ENCOUNTER — Other Ambulatory Visit (HOSPITAL_COMMUNITY)
Admission: RE | Admit: 2020-10-29 | Discharge: 2020-10-29 | Disposition: A | Discharge status: Home / Self Care | Payer: 59 | Source: Ambulatory Visit | Attending: Surgery | Admitting: Surgery

## 2020-10-29 DIAGNOSIS — Z01812 Encounter for preprocedural laboratory examination: Secondary | ICD-10-CM | POA: Insufficient documentation

## 2020-10-29 DIAGNOSIS — Z20822 Contact with and (suspected) exposure to covid-19: Secondary | ICD-10-CM | POA: Diagnosis not present

## 2020-10-29 LAB — SARS CORONAVIRUS 2 (TAT 6-24 HRS): SARS Coronavirus 2: NEGATIVE

## 2020-11-01 MED ORDER — BUPIVACAINE LIPOSOME 1.3 % IJ SUSP
20.0000 mL | INTRAMUSCULAR | Status: DC
  Filled 2020-11-01: qty 20

## 2020-11-01 NOTE — Anesthesia Preprocedure Evaluation (Addendum)
Anesthesia Evaluation  Patient identified by MRN, date of birth, ID band Patient awake    Reviewed: Allergy & Precautions, NPO status , Patient's Chart, lab work & pertinent test results, reviewed documented beta blocker date and time   History of Anesthesia Complications Negative for: history of anesthetic complications  Airway Mallampati: II  TM Distance: >3 FB Neck ROM: Full    Dental  (+) Caps, Dental Advisory Given, Missing   Pulmonary former smoker,  10/29/2020 SARS coronavirus NEG   breath sounds clear to auscultation       Cardiovascular hypertension, Pt. on medications and Pt. on home beta blockers (-) angina+ CAD (medical management) and + Peripheral Vascular Disease   Rhythm:Regular Rate:Normal  '21 Stress: small apical anterior defect, EF 62% '21 ECHO: EF 60-65%, Grade 1 DD, no significant valvular abnormalities   Neuro/Psych Depression H/o meningioma: XRT    GI/Hepatic Neg liver ROS, PUD, GERD  Medicated and Controlled,  Endo/Other  diabetes (no longer requires medication)  Renal/GU negative Renal ROS     Musculoskeletal   Abdominal   Peds  Hematology  (+) Blood dyscrasia (Hb 10.5), anemia ,   Anesthesia Other Findings   Reproductive/Obstetrics                            Anesthesia Physical Anesthesia Plan  ASA: III  Anesthesia Plan: General   Post-op Pain Management:    Induction: Intravenous  PONV Risk Score and Plan: 3 and Ondansetron, Dexamethasone and Scopolamine patch - Pre-op  Airway Management Planned: Oral ETT  Additional Equipment: None  Intra-op Plan:   Post-operative Plan: Extubation in OR  Informed Consent: I have reviewed the patients History and Physical, chart, labs and discussed the procedure including the risks, benefits and alternatives for the proposed anesthesia with the patient or authorized representative who has indicated his/her  understanding and acceptance.     Dental advisory given  Plan Discussed with: CRNA and Surgeon  Anesthesia Plan Comments:        Anesthesia Quick Evaluation

## 2020-11-02 ENCOUNTER — Ambulatory Visit (HOSPITAL_COMMUNITY): Payer: 59 | Admitting: Physician Assistant

## 2020-11-02 ENCOUNTER — Ambulatory Visit (HOSPITAL_COMMUNITY)
Admission: RE | Admit: 2020-11-02 | Discharge: 2020-11-02 | Disposition: A | Discharge status: Home / Self Care | Payer: 59 | Attending: Surgery | Admitting: Surgery

## 2020-11-02 ENCOUNTER — Encounter (HOSPITAL_COMMUNITY)
Admission: RE | Disposition: A | Discharge status: Home / Self Care | Payer: Self-pay | Source: Home / Self Care | Attending: Surgery

## 2020-11-02 ENCOUNTER — Encounter (HOSPITAL_COMMUNITY): Payer: Self-pay | Admitting: Surgery

## 2020-11-02 ENCOUNTER — Ambulatory Visit (HOSPITAL_COMMUNITY): Payer: 59 | Admitting: Certified Registered Nurse Anesthetist

## 2020-11-02 DIAGNOSIS — K3580 Unspecified acute appendicitis: Secondary | ICD-10-CM | POA: Diagnosis not present

## 2020-11-02 DIAGNOSIS — D649 Anemia, unspecified: Secondary | ICD-10-CM | POA: Diagnosis not present

## 2020-11-02 DIAGNOSIS — K37 Unspecified appendicitis: Secondary | ICD-10-CM | POA: Diagnosis not present

## 2020-11-02 DIAGNOSIS — Z87891 Personal history of nicotine dependence: Secondary | ICD-10-CM | POA: Diagnosis not present

## 2020-11-02 DIAGNOSIS — E785 Hyperlipidemia, unspecified: Secondary | ICD-10-CM | POA: Diagnosis not present

## 2020-11-02 DIAGNOSIS — Z79899 Other long term (current) drug therapy: Secondary | ICD-10-CM | POA: Insufficient documentation

## 2020-11-02 DIAGNOSIS — I10 Essential (primary) hypertension: Secondary | ICD-10-CM | POA: Diagnosis not present

## 2020-11-02 HISTORY — PX: LAPAROSCOPIC APPENDECTOMY: SHX408

## 2020-11-02 SURGERY — APPENDECTOMY, LAPAROSCOPIC
Anesthesia: General

## 2020-11-02 MED ORDER — ROCURONIUM BROMIDE 10 MG/ML (PF) SYRINGE
PREFILLED_SYRINGE | INTRAVENOUS | Status: DC | PRN
  Administered 2020-11-02: 60 mg via INTRAVENOUS

## 2020-11-02 MED ORDER — LACTATED RINGERS IV SOLN: INTRAVENOUS | Status: DC

## 2020-11-02 MED ORDER — BUPIVACAINE-EPINEPHRINE (PF) 0.25% -1:200000 IJ SOLN
INTRAMUSCULAR | Status: AC
  Filled 2020-11-02: qty 30

## 2020-11-02 MED ORDER — MIDAZOLAM HCL 5 MG/5ML IJ SOLN
INTRAMUSCULAR | Status: DC | PRN
  Administered 2020-11-02: 2 mg via INTRAVENOUS

## 2020-11-02 MED ORDER — CHLORHEXIDINE GLUCONATE CLOTH 2 % EX PADS: 6.0000 | MEDICATED_PAD | Freq: Once | CUTANEOUS | Status: DC

## 2020-11-02 MED ORDER — EPHEDRINE SULFATE-NACL 50-0.9 MG/10ML-% IV SOSY
PREFILLED_SYRINGE | INTRAVENOUS | Status: DC | PRN
  Administered 2020-11-02 (×3): 5 mg via INTRAVENOUS
  Administered 2020-11-02: 10 mg via INTRAVENOUS

## 2020-11-02 MED ORDER — CEFAZOLIN SODIUM-DEXTROSE 2-4 GM/100ML-% IV SOLN
2.0000 g | INTRAVENOUS | Status: AC
  Administered 2020-11-02: 2 g via INTRAVENOUS
  Filled 2020-11-02: qty 100

## 2020-11-02 MED ORDER — MIDAZOLAM HCL 2 MG/2ML IJ SOLN: 0.5000 mg | Freq: Once | INTRAMUSCULAR | Status: DC | PRN

## 2020-11-02 MED ORDER — LIDOCAINE 2% (20 MG/ML) 5 ML SYRINGE
INTRAMUSCULAR | Status: AC
  Filled 2020-11-02: qty 5

## 2020-11-02 MED ORDER — PHENYLEPHRINE HCL (PRESSORS) 10 MG/ML IV SOLN
INTRAVENOUS | Status: AC
  Filled 2020-11-02: qty 1

## 2020-11-02 MED ORDER — SODIUM CHLORIDE 0.9% FLUSH: 3.0000 mL | Freq: Two times a day (BID) | INTRAVENOUS | Status: DC

## 2020-11-02 MED ORDER — ONDANSETRON HCL 4 MG/2ML IJ SOLN
INTRAMUSCULAR | Status: AC
  Filled 2020-11-02: qty 2

## 2020-11-02 MED ORDER — OXYCODONE HCL 5 MG PO TABS: 5.0000 mg | ORAL_TABLET | ORAL | Status: DC | PRN

## 2020-11-02 MED ORDER — TRAMADOL HCL 50 MG PO TABS: 50.0000 mg | ORAL_TABLET | Freq: Four times a day (QID) | ORAL | 0 refills | Status: DC | PRN

## 2020-11-02 MED ORDER — ROCURONIUM BROMIDE 10 MG/ML (PF) SYRINGE
PREFILLED_SYRINGE | INTRAVENOUS | Status: AC
  Filled 2020-11-02: qty 10

## 2020-11-02 MED ORDER — ACETAMINOPHEN 325 MG PO TABS: 650.0000 mg | ORAL_TABLET | ORAL | Status: DC | PRN

## 2020-11-02 MED ORDER — FENTANYL CITRATE (PF) 100 MCG/2ML IJ SOLN
INTRAMUSCULAR | Status: DC | PRN
  Administered 2020-11-02 (×2): 50 ug via INTRAVENOUS
  Administered 2020-11-02: 100 ug via INTRAVENOUS

## 2020-11-02 MED ORDER — DOCUSATE SODIUM 100 MG PO CAPS: 100.0000 mg | ORAL_CAPSULE | Freq: Two times a day (BID) | ORAL | 0 refills | Status: AC

## 2020-11-02 MED ORDER — MIDAZOLAM HCL 2 MG/2ML IJ SOLN
INTRAMUSCULAR | Status: AC
  Filled 2020-11-02: qty 2

## 2020-11-02 MED ORDER — ONDANSETRON HCL 4 MG/2ML IJ SOLN
INTRAMUSCULAR | Status: DC | PRN
  Administered 2020-11-02: 4 mg via INTRAVENOUS

## 2020-11-02 MED ORDER — OXYCODONE HCL 5 MG/5ML PO SOLN
5.0000 mg | Freq: Once | ORAL | Status: DC | PRN
Start: 2020-11-02 — End: 2020-11-02

## 2020-11-02 MED ORDER — DEXAMETHASONE SODIUM PHOSPHATE 10 MG/ML IJ SOLN
INTRAMUSCULAR | Status: AC
  Filled 2020-11-02: qty 1

## 2020-11-02 MED ORDER — EPHEDRINE 5 MG/ML INJ
INTRAVENOUS | Status: AC
  Filled 2020-11-02: qty 10

## 2020-11-02 MED ORDER — 0.9 % SODIUM CHLORIDE (POUR BTL) OPTIME
TOPICAL | Status: DC | PRN
  Administered 2020-11-02: 1000 mL

## 2020-11-02 MED ORDER — PROMETHAZINE HCL 25 MG/ML IJ SOLN: 6.2500 mg | INTRAMUSCULAR | Status: DC | PRN

## 2020-11-02 MED ORDER — FENTANYL CITRATE (PF) 100 MCG/2ML IJ SOLN: 25.0000 ug | INTRAMUSCULAR | Status: DC | PRN

## 2020-11-02 MED ORDER — MEPERIDINE HCL 50 MG/ML IJ SOLN: 6.2500 mg | INTRAMUSCULAR | Status: DC | PRN

## 2020-11-02 MED ORDER — ORAL CARE MOUTH RINSE: 15.0000 mL | Freq: Once | OROMUCOSAL | Status: AC

## 2020-11-02 MED ORDER — HYDROMORPHONE HCL 1 MG/ML IJ SOLN: 0.2500 mg | INTRAMUSCULAR | Status: DC | PRN

## 2020-11-02 MED ORDER — SODIUM CHLORIDE 0.9 % IV SOLN: 250.0000 mL | INTRAVENOUS | Status: DC | PRN

## 2020-11-02 MED ORDER — OXYCODONE HCL 5 MG PO TABS: 5.0000 mg | ORAL_TABLET | Freq: Once | ORAL | Status: DC | PRN

## 2020-11-02 MED ORDER — BUPIVACAINE-EPINEPHRINE 0.25% -1:200000 IJ SOLN
INTRAMUSCULAR | Status: DC | PRN
  Administered 2020-11-02: 30 mL

## 2020-11-02 MED ORDER — CHLORHEXIDINE GLUCONATE 0.12 % MT SOLN
15.0000 mL | Freq: Once | OROMUCOSAL | Status: AC
  Administered 2020-11-02: 15 mL via OROMUCOSAL

## 2020-11-02 MED ORDER — LACTATED RINGERS IR SOLN
Status: DC | PRN
  Administered 2020-11-02: 1000 mL

## 2020-11-02 MED ORDER — FENTANYL CITRATE (PF) 250 MCG/5ML IJ SOLN
INTRAMUSCULAR | Status: AC
  Filled 2020-11-02: qty 5

## 2020-11-02 MED ORDER — DEXAMETHASONE SODIUM PHOSPHATE 10 MG/ML IJ SOLN
INTRAMUSCULAR | Status: DC | PRN
  Administered 2020-11-02: 10 mg via INTRAVENOUS

## 2020-11-02 MED ORDER — ACETAMINOPHEN 500 MG PO TABS: 1000.0000 mg | ORAL_TABLET | Freq: Once | ORAL | Status: DC

## 2020-11-02 MED ORDER — SUGAMMADEX SODIUM 200 MG/2ML IV SOLN
INTRAVENOUS | Status: DC | PRN
  Administered 2020-11-02: 200 mg via INTRAVENOUS

## 2020-11-02 MED ORDER — LIDOCAINE 2% (20 MG/ML) 5 ML SYRINGE
INTRAMUSCULAR | Status: DC | PRN
  Administered 2020-11-02: 20 mg via INTRAVENOUS

## 2020-11-02 MED ORDER — ACETAMINOPHEN 500 MG PO TABS
1000.0000 mg | ORAL_TABLET | ORAL | Status: AC
  Administered 2020-11-02: 1000 mg via ORAL
  Filled 2020-11-02: qty 2

## 2020-11-02 MED ORDER — SCOPOLAMINE 1 MG/3DAYS TD PT72
1.0000 | MEDICATED_PATCH | TRANSDERMAL | Status: DC
  Administered 2020-11-02: 1.5 mg via TRANSDERMAL
  Filled 2020-11-02: qty 1

## 2020-11-02 MED ORDER — PROPOFOL 10 MG/ML IV BOLUS
INTRAVENOUS | Status: DC | PRN
  Administered 2020-11-02: 120 mg via INTRAVENOUS

## 2020-11-02 MED ORDER — ACETAMINOPHEN 650 MG RE SUPP
650.0000 mg | RECTAL | Status: DC | PRN
  Filled 2020-11-02: qty 1

## 2020-11-02 MED ORDER — SODIUM CHLORIDE 0.9% FLUSH: 3.0000 mL | INTRAVENOUS | Status: DC | PRN

## 2020-11-02 MED ORDER — PROPOFOL 10 MG/ML IV BOLUS
INTRAVENOUS | Status: AC
  Filled 2020-11-02: qty 20

## 2020-11-02 SURGICAL SUPPLY — 53 items
ADH SKN CLS APL DERMABOND .7 (GAUZE/BANDAGES/DRESSINGS) ×1
APL PRP STRL LF DISP 70% ISPRP (MISCELLANEOUS) ×1
APPLIER CLIP 5 13 M/L LIGAMAX5 (MISCELLANEOUS)
APPLIER CLIP ROT 10 11.4 M/L (STAPLE)
APR CLP MED LRG 11.4X10 (STAPLE)
APR CLP MED LRG 5 ANG JAW (MISCELLANEOUS)
BAG SPEC RTRVL LRG 6X4 10 (ENDOMECHANICALS) ×1
CABLE HIGH FREQUENCY MONO STRZ (ELECTRODE) IMPLANT
CHLORAPREP W/TINT 26 (MISCELLANEOUS) ×2 IMPLANT
CLIP APPLIE 5 13 M/L LIGAMAX5 (MISCELLANEOUS) IMPLANT
CLIP APPLIE ROT 10 11.4 M/L (STAPLE) IMPLANT
COVER SURGICAL LIGHT HANDLE (MISCELLANEOUS) ×2 IMPLANT
COVER WAND RF STERILE (DRAPES) IMPLANT
CUTTER FLEX LINEAR 45M (STAPLE) ×1 IMPLANT
DECANTER SPIKE VIAL GLASS SM (MISCELLANEOUS) ×2 IMPLANT
DERMABOND ADVANCED (GAUZE/BANDAGES/DRESSINGS) ×1
DERMABOND ADVANCED .7 DNX12 (GAUZE/BANDAGES/DRESSINGS) ×1 IMPLANT
DRAIN CHANNEL 19F RND (DRAIN) IMPLANT
ELECT REM PT RETURN 15FT ADLT (MISCELLANEOUS) ×2 IMPLANT
ENDOLOOP SUT PDS II  0 18 (SUTURE)
ENDOLOOP SUT PDS II 0 18 (SUTURE) IMPLANT
EVACUATOR SILICONE 100CC (DRAIN) IMPLANT
GLOVE SURG ENC MOIS LTX SZ6 (GLOVE) ×2 IMPLANT
GLOVE SURG UNDER LTX SZ6.5 (GLOVE) ×2 IMPLANT
GOWN STRL REUS W/ TWL LRG LVL3 (GOWN DISPOSABLE) ×1 IMPLANT
GOWN STRL REUS W/TWL LRG LVL3 (GOWN DISPOSABLE) ×2
GOWN STRL REUS W/TWL XL LVL3 (GOWN DISPOSABLE) ×2 IMPLANT
GRASPER SUT TROCAR 14GX15 (MISCELLANEOUS) ×1 IMPLANT
IRRIG SUCT STRYKERFLOW 2 WTIP (MISCELLANEOUS) ×2
IRRIGATION SUCT STRKRFLW 2 WTP (MISCELLANEOUS) ×1 IMPLANT
KIT BASIN OR (CUSTOM PROCEDURE TRAY) ×2 IMPLANT
KIT TURNOVER KIT A (KITS) ×2 IMPLANT
NDL INSUFFLATION 14GA 120MM (NEEDLE) ×1 IMPLANT
NEEDLE INSUFFLATION 14GA 120MM (NEEDLE) ×2 IMPLANT
POUCH SPECIMEN RETRIEVAL 10MM (ENDOMECHANICALS) ×2 IMPLANT
RELOAD 45 VASCULAR/THIN (ENDOMECHANICALS) ×2 IMPLANT
RELOAD STAPLE 45 2.5 WHT GRN (ENDOMECHANICALS) IMPLANT
RELOAD STAPLE 45 3.5 BLU ETS (ENDOMECHANICALS) IMPLANT
RELOAD STAPLE TA45 3.5 REG BLU (ENDOMECHANICALS) IMPLANT
SCISSORS LAP 5X35 DISP (ENDOMECHANICALS) ×1 IMPLANT
SET IRRIG TUBING LAPAROSCOPIC (IRRIGATION / IRRIGATOR) ×1 IMPLANT
SET TUBE SMOKE EVAC HIGH FLOW (TUBING) ×2 IMPLANT
SHEARS HARMONIC ACE PLUS 36CM (ENDOMECHANICALS) ×2 IMPLANT
SLEEVE XCEL OPT CAN 5 100 (ENDOMECHANICALS) ×2 IMPLANT
SUT ETHILON 2 0 PS N (SUTURE) IMPLANT
SUT MNCRL AB 4-0 PS2 18 (SUTURE) ×2 IMPLANT
TOWEL OR 17X26 10 PK STRL BLUE (TOWEL DISPOSABLE) ×2 IMPLANT
TOWEL OR NON WOVEN STRL DISP B (DISPOSABLE) ×2 IMPLANT
TRAY FOLEY MTR SLVR 14FR STAT (SET/KITS/TRAYS/PACK) IMPLANT
TRAY FOLEY MTR SLVR 16FR STAT (SET/KITS/TRAYS/PACK) IMPLANT
TRAY LAPAROSCOPIC (CUSTOM PROCEDURE TRAY) ×2 IMPLANT
TROCAR BLADELESS OPT 5 100 (ENDOMECHANICALS) ×2 IMPLANT
TROCAR XCEL 12X100 BLDLESS (ENDOMECHANICALS) ×2 IMPLANT

## 2020-11-02 NOTE — Anesthesia Postprocedure Evaluation (Signed)
Anesthesia Post Note  Patient: Jaime [name]  Procedure(s) Performed: APPENDECTOMY LAPAROSCOPIC (N/A )     Patient location during evaluation: PACU Anesthesia Type: General Level of consciousness: awake and alert, patient cooperative and oriented Pain management: pain level controlled Vital Signs Assessment: post-procedure vital signs reviewed and stable Respiratory status: spontaneous breathing, nonlabored ventilation and respiratory function stable Cardiovascular status: blood pressure returned to baseline and stable Postop Assessment: no apparent nausea or vomiting Anesthetic complications: no   No complications documented.  Last Vitals:  Vitals:   11/02/20 0930 11/02/20 0945  BP:  131/80  Pulse:  66  Resp:  16  Temp:  36.4 C  SpO2: 94% 92%    Last Pain:  Vitals:   11/02/20 0945  TempSrc:   PainSc: 0-No pain                 Nyaire Denbleyker,E. Gabryela Kimbrell

## 2020-11-02 NOTE — Transfer of Care (Signed)
Immediate Anesthesia Transfer of Care Note  Patient: Jaime [name]  Procedure(s) Performed: APPENDECTOMY LAPAROSCOPIC (N/A )  Patient Location: PACU  Anesthesia Type:General  Level of Consciousness: awake, alert  and oriented  Airway & Oxygen Therapy: Patient Spontanous Breathing and Patient connected to face mask oxygen  Post-op Assessment: Report given to RN and Post -op Vital signs reviewed and stable  Post vital signs: Reviewed and stable  Last Vitals:  Vitals Value Taken Time  BP    Temp    Pulse    Resp    SpO2      Last Pain:  Vitals:   11/02/20 0605  TempSrc: Oral  PainSc:       Patients Stated Pain Goal: 5 (00/17/49 4496)  Complications: No complications documented.

## 2020-11-02 NOTE — Interval H&P Note (Signed)
History and Physical Interval Note:  11/02/2020 7:08 AM  Jaime [name]  has presented today for surgery, with the diagnosis of appendicitis.  The various methods of treatment have been discussed with the patient and family. After consideration of risks, benefits and other options for treatment, the patient has consented to  Procedure(s): APPENDECTOMY LAPAROSCOPIC (N/A) as a surgical intervention.  The patient's history has been reviewed, patient examined, no change in status, stable for surgery.  I have reviewed the patient's chart and labs.  Questions were answered to the patient's satisfaction.     Ari Bernabei Rich Brave

## 2020-11-02 NOTE — Op Note (Signed)
Operative Report  Jaime [name] 65 y.o. female  [id number]  [id number]  11/02/2020  Surgeon: Romana Juniper MD FACS   Assistant: Wynelle Link MD   Procedure performed: Laparoscopic Appendectomy  Preop diagnosis: history of appendicitis treated with antibiotics  Post-op diagnosis/intraop findings: same  Specimens: appendix  EBL: minimal  Complications: none  Description of procedure: After obtaining informed consent the patient was brought to the operating room. Antibiotics were administered. SCD's were applied. General endotracheal anesthesia was initiated and a formal time-out was performed. Foley catheter was inserted which is removed at the end of the case. The abdomen was prepped and draped in the usual sterile fashion and the abdomen was entered using a Veress needle at Palmer's point and insufflated to 15 mmHg. A 5 mm trocar and camera were then introduced, the abdomen was inspected and there is no evidence of injury from our entry. No anterior abdominal wall adhesions. An umbilical 12 mm trocar and a left lower quadrant 5 mm trocar were introduced under direct visualization following infiltration with local. The patient was then placed in Trendelenburg and rotated to the left and the small bowel was reflected cephalad. The appendix was visualized: it is mildly dilated with some adhesions to the terminal ileal mesentery but does not appear actively inflamed on gross inspection. No free fluid or purulence was present. The appendix was retracted toward the anterior abdominal wall. Adhesions between the mesoappendix and terminal ileal mesentery were lysed with the harmonic scalpel, ensuring no injury to the adjacent bowel.  The mesoappendix was then divided with the harmonic scalpel until the base of the appendix was skeletonized.  A white load linear cutting stapler was then used to transect the appendix from the cecum, taking a small cuff of cecum with the specimen. Hemostasis  was ensured with some direct pressure. The appendix was placed in an Endo Catch bag and removed through our 12 mm trocar site. The 25mm trocar site at the umbilicus was closed transversely with 2 interrupted 0 vicryls in the fascia under direct visualization using a PMI device.  The right lower quadrant was inspected once again and confirmed to be hemostatic and with no evidence of bowel injury.  The staple line appears intact and viable.  The abdomen was desufflated and all trocars removed. The skin incisions were closed with subcuticular 4-0 monocryl and Dermabond. The patient was awakened, extubated and transported to the recovery room in stable condition.   All counts were correct at the completion of the case.

## 2020-11-02 NOTE — Anesthesia Procedure Notes (Signed)
Procedure Name: Intubation Date/Time: 11/02/2020 7:33 AM Performed by: Maxwell Caul, CRNA Pre-anesthesia Checklist: Patient identified, Emergency Drugs available, Suction available and Patient being monitored Patient Re-evaluated:Patient Re-evaluated prior to induction Oxygen Delivery Method: Circle system utilized Preoxygenation: Pre-oxygenation with 100% oxygen Induction Type: IV induction Ventilation: Mask ventilation without difficulty Laryngoscope Size: Mac and 4 Grade View: Grade I Tube type: Oral Tube size: 7.0 mm Number of attempts: 1 Airway Equipment and Method: Stylet Placement Confirmation: ETT inserted through vocal cords under direct vision,  positive ETCO2 and breath sounds checked- equal and bilateral Secured at: 21 cm Tube secured with: Tape Dental Injury: Teeth and Oropharynx as per pre-operative assessment

## 2020-11-02 NOTE — Discharge Instructions (Signed)
LAPAROSCOPIC SURGERY: POST OP INSTRUCTIONS   EAT Gradually transition to a high fiber diet with a fiber supplement over the next few weeks after discharge.  Start with a pureed / full liquid diet (see below)  WALK Walk an hour a day.  Control your pain to do that.    CONTROL PAIN Control pain so that you can walk, sleep, tolerate sneezing/coughing, go up/down stairs.  HAVE A BOWEL MOVEMENT DAILY Keep your bowels regular to avoid problems.  OK to try a laxative to override constipation.  OK to use an antidairrheal to slow down diarrhea.  Call if not better after 2 tries  CALL IF YOU HAVE PROBLEMS/CONCERNS Call if you are still struggling despite following these instructions. Call if you have concerns not answered by these instructions    1. DIET: Follow a light bland diet & liquids the first 24 hours after arrival home, such as soup, liquids, starches, etc.  Be sure to drink plenty of fluids.  Quickly advance to a usual solid diet within a few days.  Avoid fast food or heavy meals as your are more likely to get nauseated or have irregular bowels.  A low-sugar, high-fiber diet for the rest of your life is ideal.  2. Take your usually prescribed home medications unless otherwise directed.  3. PAIN CONTROL: a. Pain is best controlled by a usual combination of three different methods TOGETHER: i. Ice/Heat ii. Over the counter pain medication iii. Prescription pain medication b. Most patients will experience some swelling and bruising around the incisions.  Ice packs or heating pads (30-60 minutes up to 6 times a day) will help. Use ice for the first few days to help decrease swelling and bruising, then switch to heat to help relax tight/sore spots and speed recovery.  Some people prefer to use ice alone, heat alone, alternating between ice & heat.  Experiment to what works for you.  Swelling and bruising can take several weeks to resolve.   c. It is helpful to take an over-the-counter pain  medication regularly for the first few days: i. Naproxen (Aleve, etc)  Two 220mg  tabs twice a day OR Ibuprofen (Advil, etc) Three 200mg  tabs four times a day (every meal & bedtime) AND ii. Acetaminophen (Tylenol, etc) 500-650mg  four times a day (every meal & bedtime) d. A  prescription for pain medication (such as oxycodone, hydrocodone, tramadol, gabapentin, methocarbamol, etc) should be given to you upon discharge.  Take your pain medication as prescribed, IF NEEDED.  i. If you are having problems/concerns with the prescription medicine (does not control pain, nausea, vomiting, rash, itching, etc), please call us [phone] to see if we need to switch you to a different pain medicine that will work better for you and/or control your side effect better. ii. If you need a refill on your pain medication, please give [place] 48 hour notice.  contact your pharmacy.  They will contact our office to request authorization. Prescriptions will not be filled after 5 pm or on week-ends  4. Avoid getting constipated.   a. Between the surgery and the pain medications, it is common to experience some constipation.   b. Increasing fluid intake and taking a fiber supplement (such as Metamucil, Citrucel, FiberCon, MiraLax, etc) 1-2 times a day regularly will usually help prevent this problem from occurring.   c. A mild laxative (prune juice, Milk of Magnesia, MiraLax, etc) should be taken according to package directions if there are no bowel movements after 48 hours.  5. Watch out for diarrhea.   a. If you have many loose bowel movements, simplify your diet to bland foods & liquids for a few days.   b. Stop any stool softeners and decrease your fiber supplement.   c. Switching to mild anti-diarrheal medications (Kayopectate, Pepto Bismol) can help.   d. If this worsens or does not improve, please call us.  6. Wash / shower every day.  You may shower over the skin glue which is waterproof  7. Glue will flake off  after about 2 weeks.  You may leave the incision open to air.  You may replace a dressing/Band-Aid to cover the incision for comfort if you wish.   8. ACTIVITIES as tolerated:   a. You may resume regular (light) daily activities beginning the next day--such as daily self-care, walking, climbing stairs--gradually increasing activities as tolerated.  If you can walk 30 minutes without difficulty, it is safe to try more intense activity such as jogging, treadmill, bicycling, low-impact aerobics, swimming, etc. b. Save the most intensive and strenuous activity for last such as sit-ups, heavy lifting, contact sports, etc  Refrain from any heavy lifting or straining until you are off narcotics for pain control.   c. DO NOT PUSH THROUGH PAIN.  Let pain be your guide: If it hurts to do something, don't do it.  Pain is your body warning you to avoid that activity for another week until the pain goes down. d. You may drive when you are no longer taking prescription pain medication, you can comfortably wear a seatbelt, and you can safely maneuver your car and apply brakes. e. You may have sexual intercourse when it is comfortable.  9. FOLLOW UP in our office a. Please call [facility] at [phone] to set up an appointment to see your surgeon in the office for a follow-up appointment approximately 2-3 weeks after your surgery. b. Make sure that you call for this appointment the day you arrive home to insure a convenient appointment time.  10. IF YOU HAVE DISABILITY OR FAMILY LEAVE FORMS, BRING THEM TO THE OFFICE FOR PROCESSING.  DO NOT GIVE THEM TO YOUR DOCTOR.   WHEN TO CALL US [phone]: 1. Poor pain control 2. Reactions / problems with new medications (rash/itching, nausea, etc)  3. Fever over 101.5 F (38.5 C) 4. Inability to urinate 5. Nausea and/or vomiting 6. Worsening swelling or bruising 7. Continued bleeding from incision. 8. Increased pain, redness, or drainage from the incision   The  clinic staff is available to answer your questions during regular business hours (8:30am-5pm).  Please don't hesitate to call and ask to speak to one of our nurses for clinical concerns.   If you have a medical emergency, go to the nearest emergency room or call 911.  A surgeon from [facility] is always on call at the hospitals   [facility] [address], [address], [place], [place]  [address] ? MAIN: [phone] ? TOLL FREE: [phone] ?  FAX [phone] [email or web address]   

## 2020-11-03 ENCOUNTER — Telehealth: Payer: Self-pay | Admitting: Student in an Organized Health Care Education/Training Program

## 2020-11-03 ENCOUNTER — Encounter (HOSPITAL_COMMUNITY): Payer: Self-pay | Admitting: Surgery

## 2020-11-03 LAB — SURGICAL PATHOLOGY

## 2020-11-03 NOTE — Telephone Encounter (Signed)
POD1 uncomplicated laparoscopic appendectomy with Dr. Bennye Alm to check on patient; verified by full name + DOB on cell phone number  Feeling well Pain controlled with Tylenol + Tramadol (one dose last night, none yet this AM) Steady on feet and ambulating independently in house and on stairs Tolerating diet Passing flatus, voiding spontaneously [place] at [facility] site, others are fairly benign No other concerns or questions; patient will call office later today or this week to verify followup appt timing  Wynelle Link, MD General Surgery PGY4

## 2020-11-23 ENCOUNTER — Other Ambulatory Visit: Payer: Self-pay | Admitting: Internal Medicine

## 2020-11-23 DIAGNOSIS — K219 Gastro-esophageal reflux disease without esophagitis: Secondary | ICD-10-CM

## 2020-12-25 NOTE — Progress Notes (Deleted)
Cardiology Office Note:    Date:  12/25/2020   ID:  Jaime [name], DOB May 14, 1956, MRN [id number]  PCP:  Janith Lima, MD  [facility] Cardiologist:  Freada Bergeron, MD  [facility] Electrophysiologist:  None   Referring MD: Janith Lima, MD   History of Present Illness:    Jaime [name] is a 65 y.o. 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  Was last seen in clinic on 04/02/20 where she was having episodes of dyspnea on exertion, palpitations and fatigue. Myoview 02/2020 with small apical ischemia, low risk. TTE with LVEF 60-65%, G1DD, no significant valve disease.     Past Medical History:  Diagnosis Date   Anemia    Dyspnea    with exertion    Endometriosis    GERD (gastroesophageal reflux disease)    Hypertension    Meningioma (HCC)    Nontoxic multinodular goiter    Occlusion and stenosis of carotid artery without mention of cerebral infarction    Peptic ulcer disease    PVD (peripheral vascular disease) ([place])     Past Surgical History:  Procedure Laterality Date   CT RADIATION THERAPY GUIDE     CT done yearly for menigioma with radiation if needed   CYST EXCISION     head   CYST EXCISION Left    x 2    cyst removed     left wrist   endometriosis surgery      LAPAROSCOPIC APPENDECTOMY N/A 11/02/2020   Procedure: APPENDECTOMY LAPAROSCOPIC;  Surgeon: Clovis Riley, MD;  Location: [facility];  Service: General;  Laterality: N/A;   ORIF ANKLE FRACTURE     right   OVARIAN CYST REMOVAL     right   SHOULDER SURGERY Right    Spur removal   [place]  2012   right thumb   TUBAL LIGATION     VIDEO BRONCHOSCOPY  11/10/2011   Procedure: VIDEO BRONCHOSCOPY WITH FLUORO;  Surgeon: Tanda Rockers, MD;  Location: [facility] ENDOSCOPY;  Service: Cardiopulmonary;  Laterality: Bilateral;    Current  Medications: No outpatient medications have been marked as taking for the 01/01/21 encounter (Appointment) with Freada Bergeron, MD.     Allergies:   Patient has no known allergies.   Social History   Socioeconomic History   Marital status: Married    Spouse name: Not on file   Number of children: 1   Years of education: Not on file   Highest education level: Not on file  Occupational History   Occupation: Geophysical data processor     Employer: [employer]: Retired  Tobacco Use   Smoking status: Former    Packs/day: 0.30    Years: 3.00    Pack years: 0.90    Types: Cigarettes    Quit date: 06/20/1981    Years since quitting: 39.5   Smokeless tobacco: Never  Vaping Use   Vaping Use: Never used  Substance and Sexual Activity   Alcohol use: No   Drug use: No   Sexual activity: Not Currently    Birth control/protection: Post-menopausal  Other Topics Concern   Not on file  Social History Narrative   HSG-Pge, 2 years at [employer]  Married 1989 - marriage is ok.  Daughter - 1979, 2 step-sons; 2 grand-children.  Retired - from [employer] entry, got "out-sourced." She does keep her grandchildren.             Social Determinants of Health   Financial Resource Strain: Not on file  Food Insecurity: Not on file  Transportation Needs: Not on file  Physical Activity: Not on file  Stress: Not on file  Social Connections: Not on file     Family History: The patient's family history includes Colon cancer in her maternal grandmother; Dementia in her mother; Diabetes in her mother; Esophageal cancer in her father; Heart disease in her mother; Heart failure in her mother; Hypertension in her mother; Lung cancer in her mother. There is no history of Breast cancer or Coronary artery disease.  ROS:   Please see the history of present illness.    The patient denies chest pain, chest pressure, dyspnea at rest, PND, orthopnea, or leg swelling. Denies cough, fever, chills.  Denies nausea, vomiting. Denies syncope or presyncope. Denies dizziness or lightheadedness. Denies snoring.   EKGs/Labs/Other Studies Reviewed:    The following studies were reviewed today: TTE 05/07/2020:  1. Left ventricular ejection fraction, by estimation, is 60 to 65%. The  left ventricle has normal function. The left ventricle has no regional  wall motion abnormalities. Left ventricular diastolic parameters are  consistent with Grade I diastolic  dysfunction (impaired relaxation).   2. Right ventricular systolic function is normal. The right ventricular  size is normal. There is normal pulmonary artery systolic pressure.   3. The mitral valve is normal in structure. Trivial mitral valve  regurgitation. No evidence of mitral stenosis.   4. The aortic valve is tricuspid. Aortic valve regurgitation is not  visualized. No aortic stenosis is present.   5. The inferior vena cava is normal in size with greater than 50%  respiratory variability, suggesting right atrial pressure of 3 mmHg.   Myoview 02/2020: There was <38mm ST segment depression noted during stress. The left ventricular ejection fraction is normal (55-65%). Nuclear stress EF: 62%. Defect 1: There is a small defect of mild severity present in the apical anterior location. Findings consistent with ischemia. This is a low risk study.   1. Reversible perfusion defect in apical anterior wall suggestive of small area of ischemia 2. Low risk study given small area of ischemia.  Would recommend referral to cardiology for further evaluation  TTE 09/01/2011: Study Conclusions   - Procedure narrative: Transthoracic echocardiography. Image    quality was poor.  - Left ventricle: The cavity size was normal. Wall thickness    was normal. Systolic function was normal. The estimated    ejection fraction was in the range of 55% to 60%. Left    ventricular diastolic function parameters were normal.  Transthoracic echocardiography.   M-mode, complete 2D,  spectral Doppler, and color Doppler.  Height:  Height:  167.6cm. Height: 66in.  Weight:  Weight: 88.9kg. Weight:  195.6lb.  Body mass index:  BMI: 31.6kg/m^2.  Body surface  area:    BSA: 1.76m^2.  Blood pressure:     109/67.  Patient  status:  Inpatient.  Location:  Echo laboratory.   ------------------------------------------------------------   ------------------------------------------------------------  Left ventricle:  The cavity size was normal. Wall thickness  was normal. Systolic function was normal. The estimated  ejection fraction was in the range of 55% to 60%. The  transmitral flow pattern was normal. The deceleration time  of the early transmitral flow velocity was normal. The  pulmonary vein flow pattern was  normal. The tissue Doppler  parameters were normal. Left ventricular diastolic function  parameters were normal.   ------------------------------------------------------------  Aortic valve:   Structurally normal valve.   Cusp separation  was normal.  Doppler:  Transvalvular velocity was within the  normal range. There was no stenosis.  No regurgitation.   ------------------------------------------------------------  Aorta:  The aorta was normal, not dilated, and non-diseased.     ------------------------------------------------------------  Mitral valve:   Structurally normal valve.   Leaflet  separation was normal.  Doppler:  Transvalvular velocity was  within the normal range. There was no evidence for stenosis.   Trivial regurgitation.   ------------------------------------------------------------  Left atrium:  The atrium was normal in size.   ------------------------------------------------------------  Atrial septum:  Poorly visualized.   ------------------------------------------------------------  Right ventricle:  The cavity size was normal. Wall thickness  was normal. The moderator band was prominent. Systolic  function was  normal.   ------------------------------------------------------------  Pulmonic valve:   Poorly visualized.  Doppler:   No  significant regurgitation.   ------------------------------------------------------------  Tricuspid valve:  Poorly visualized.  Structurally normal  valve.   Leaflet separation was normal.  Doppler:  Transvalvular velocity was within the normal range.  No  regurgitation.   ------------------------------------------------------------  Pulmonary artery:   Poorly visualized.   ------------------------------------------------------------  Right atrium:  Poorly visualized.   ------------------------------------------------------------  Pericardium:  There was no pericardial effusion.     Myoview 2013: Findings: Multiplanar SPECT imaging shows no fixed or reversible  perfusion defect within the left ventricular myocardium.  Left  jugular cavity size appears grossly normal.   Images obtained with cardiac gating displayed using a surface  rendering algorithm show no segmental wall motion abnormality.   Left ventricular end-diastolic volume is calculated at 51 ml.  Left  ventricular end systolic volume is calculated 11 ml.  The derived  left ventricular ejection fraction is probably over estimated at  70%.   IMPRESSION:  No evidence for inducible ischemia in the left ventricle.   Normal left ventricular ejection fraction.    Carotid artery doppler Summary:   - Right ICA appears chronically occluded. No significant    left ICA stenosis or plaque noted in the ICA.  - Findings consistent with .   EKG:  EKG is  ordered today.  The ekg ordered today demonstrates NSR with LAD, early r-wave progression  Recent Labs: 03/02/2020: Brain Natriuretic Peptide 42; TSH 2.19 09/17/2020: ALT 10 09/19/2020: Magnesium 1.6 10/28/2020: BUN 12; Creatinine, Ser 0.43; Hemoglobin 10.5; Platelets 269; Potassium 3.7; Sodium 141  Recent Lipid Panel    Component Value Date/Time    CHOL 139 03/02/2020 1458   TRIG 72 03/02/2020 1458   TRIG 41 05/05/2006 1206   HDL 41 (L) 03/02/2020 1458   CHOLHDL 3.4 03/02/2020 1458   VLDL 19.6 01/29/2019 1504   LDLCALC 83 03/02/2020 1458      Physical Exam:    VS:  There were no vitals taken for this visit.    Wt Readings from Last 3 Encounters:  11/02/20 170 lb (77.1 kg)  10/28/20 170 lb (77.1 kg)  10/01/20 174 lb 3.2 oz (79 kg)     GEN:  Well nourished, well developed in no acute distress HEENT: Normal NECK: No JVD; right carotid bruit. Clear on left LYMPHATICS: No lymphadenopathy CARDIAC: RRR, no murmurs, rubs, gallops RESPIRATORY:  Clear to auscultation without rales, wheezing or rhonchi  ABDOMEN: Soft, non-tender, non-distended MUSCULOSKELETAL:  No edema; No deformity  SKIN: Warm and dry NEUROLOGIC:  Alert  and oriented x 3 PSYCHIATRIC:  Normal affect   ASSESSMENT:    No diagnosis found.  PLAN:    In order of problems listed above:  #Dyspnea on Exertion: Myoview 02/2020 demonstrated small area of anterior ischemia. Low risk. TTE with normal LVEF, no significant valve disease. On medical management.  -Continue ASA 325mg  (on higher dose due to carotid artery occlusion) -Continue atorvastatin to 40mg  daily; goal LDL <70 -Continue metop 25mg  XL -Continue losartan 50mg    #Carotid artery disease: Carotid ultrasound 2013 with occluded right carotid artery. -Continue ASA 325mg  as prescribed by Neurology -Continue atorvastatin 40mg  as above  #Hypertension: Needs aggressive blood pressure control with goal BP <120s/80s. -Continue losartan 50mg  as above -Continue metoprolol 25mg  as above  #HLD: -Continue atorvastatin 40mg  daily; goal LDL <70 -Repeat cholesterol panel with PCP in 3 months  #DMII: Managed by PCP. -Continue Synjardy  Medication Adjustments/Labs and Tests Ordered: Current medicines are reviewed at length with the patient today.  Concerns regarding medicines are outlined above.  No  orders of the defined types were placed in this encounter.  No orders of the defined types were placed in this encounter.   There are no Patient Instructions on file for this visit.   Signed, Freada Bergeron, MD  12/25/2020 7:05 PM    [place]

## 2021-01-01 ENCOUNTER — Ambulatory Visit: Payer: 59 | Admitting: Cardiology

## 2021-02-01 ENCOUNTER — Ambulatory Visit: Payer: 59 | Admitting: Cardiology

## 2021-02-04 ENCOUNTER — Ambulatory Visit (INDEPENDENT_AMBULATORY_CARE_PROVIDER_SITE_OTHER): Payer: 59 | Admitting: Internal Medicine

## 2021-02-04 ENCOUNTER — Encounter: Payer: Self-pay | Admitting: Internal Medicine

## 2021-02-04 ENCOUNTER — Other Ambulatory Visit: Payer: Self-pay

## 2021-02-04 VITALS — BP 130/76 | HR 67 | Temp 98.4°F | Ht 66.0 in | Wt 179.8 lb

## 2021-02-04 DIAGNOSIS — E042 Nontoxic multinodular goiter: Secondary | ICD-10-CM | POA: Diagnosis not present

## 2021-02-04 DIAGNOSIS — I10 Essential (primary) hypertension: Secondary | ICD-10-CM | POA: Diagnosis not present

## 2021-02-04 DIAGNOSIS — E118 Type 2 diabetes mellitus with unspecified complications: Secondary | ICD-10-CM

## 2021-02-04 DIAGNOSIS — D649 Anemia, unspecified: Secondary | ICD-10-CM

## 2021-02-04 LAB — CBC WITH DIFFERENTIAL/PLATELET
Basophils Absolute: 0.1 10*3/uL (ref 0.0–0.1)
Basophils Relative: 0.5 % (ref 0.0–3.0)
Eosinophils Absolute: 0.4 10*3/uL (ref 0.0–0.7)
Eosinophils Relative: 4.1 % (ref 0.0–5.0)
HCT: 30.9 % — ABNORMAL LOW (ref 36.0–46.0)
Hemoglobin: 10.2 g/dL — ABNORMAL LOW (ref 12.0–15.0)
Lymphocytes Relative: 28 % (ref 12.0–46.0)
Lymphs Abs: 2.8 10*3/uL (ref 0.7–4.0)
MCHC: 32.9 g/dL (ref 30.0–36.0)
MCV: 83.4 fl (ref 78.0–100.0)
Monocytes Absolute: 0.9 10*3/uL (ref 0.1–1.0)
Monocytes Relative: 8.7 % (ref 3.0–12.0)
Neutro Abs: 5.9 10*3/uL (ref 1.4–7.7)
Neutrophils Relative %: 58.7 % (ref 43.0–77.0)
Platelets: 258 10*3/uL (ref 150.0–400.0)
RBC: 3.71 Mil/uL — ABNORMAL LOW (ref 3.87–5.11)
RDW: 16.2 % — ABNORMAL HIGH (ref 11.5–15.5)
WBC: 10 10*3/uL (ref 4.0–10.5)

## 2021-02-04 LAB — BASIC METABOLIC PANEL
BUN: 13 mg/dL (ref 6–23)
CO2: 29 mEq/L (ref 19–32)
Calcium: 8.9 mg/dL (ref 8.4–10.5)
Chloride: 104 mEq/L (ref 96–112)
Creatinine, Ser: 0.83 mg/dL (ref 0.40–1.20)
GFR: 74.49 mL/min (ref >60.00)
Glucose, Bld: 87 mg/dL (ref 70–99)
Potassium: 4.1 mEq/L (ref 3.5–5.1)
Sodium: 141 mEq/L (ref 135–145)

## 2021-02-04 LAB — TSH: TSH: 2.04 u[IU]/mL (ref 0.35–5.50)

## 2021-02-04 LAB — HEMOGLOBIN A1C: Hgb A1c MFr Bld: 6.8 % — ABNORMAL HIGH (ref 4.6–6.5)

## 2021-02-04 NOTE — Progress Notes (Signed)
Subjective:  Patient ID: Jaime [name], female    DOB: 1956/04/28  Age: 65 y.o. MRN: [id number]  CC: Diabetes and Hypertension  This visit occurred during the SARS-CoV-2 public health emergency.  Safety protocols were in place, including screening questions prior to the visit, additional usage of staff PPE, and extensive cleaning of exam room while observing appropriate contact time as indicated for disinfecting solutions.    HPI [name] presents for f/up -  She is active and denies any recent episodes of chest pain, shortness of breath, dizziness, diaphoresis, lightheadedness, or edema.  Outpatient Medications Prior to Visit  Medication Sig Dispense Refill   amLODipine (NORVASC) 5 MG tablet Take 5 mg by mouth daily.     aspirin 325 MG tablet Take 325 mg by mouth daily.      atorvastatin (LIPITOR) 40 MG tablet Take 1 tablet (40 mg total) by mouth daily. 90 tablet 3   losartan (COZAAR) 100 MG tablet Take 100 mg by mouth daily.     metoprolol succinate (TOPROL XL) 25 MG 24 hr tablet Take 1 tablet (25 mg total) by mouth daily after supper. 90 tablet 3   Multiple Vitamins-Minerals (MULTIVITAMINS THER. W/MINERALS) TABS Take 1 tablet by mouth daily as needed (nutrition).     pantoprazole (PROTONIX) 40 MG tablet TAKE 1 TABLET BY MOUTH EVERY DAY IN THE MORNING 30 tablet 5   traMADol (ULTRAM) 50 MG tablet Take 1 tablet (50 mg total) by mouth every 6 (six) hours as needed. 10 tablet 0   No facility-administered medications prior to visit.    ROS Review of Systems  Constitutional:  Negative for diaphoresis and fatigue.  HENT: Negative.    Respiratory:  Negative for cough, chest tightness, shortness of breath and wheezing.   Gastrointestinal:  Negative for abdominal pain, diarrhea and nausea.  Endocrine: Negative.   Genitourinary: Negative.  Negative for difficulty urinating and dysuria.  Musculoskeletal:  Negative for arthralgias and myalgias.  Skin: Negative.   Neurological:   Negative for dizziness, weakness and light-headedness.  Hematological:  Negative for adenopathy. Does not bruise/bleed easily.  Psychiatric/Behavioral: Negative.     Objective:  BP 130/76 (BP Location: Left Arm, Patient Position: Sitting, Cuff Size: Large)   Pulse 67   Temp 98.4 F (36.9 C) (Oral)   Ht '5\' 6"'$  (1.676 m)   Wt 179 lb 12.8 oz (81.6 kg)   SpO2 99%   BMI 29.02 kg/m   BP Readings from Last 3 Encounters:  02/04/21 130/76  11/02/20 131/80  10/28/20 (!) 124/93    Wt Readings from Last 3 Encounters:  02/04/21 179 lb 12.8 oz (81.6 kg)  11/02/20 170 lb (77.1 kg)  10/28/20 170 lb (77.1 kg)    Physical Exam Vitals reviewed.  HENT:     Nose: Nose normal.     Mouth/Throat:     Mouth: Mucous membranes are moist.  Eyes:     Conjunctiva/sclera: Conjunctivae normal.  Neck:     Thyroid: No thyroid mass, thyromegaly or thyroid tenderness.  Cardiovascular:     Rate and Rhythm: Normal rate and regular rhythm.     Heart sounds: No murmur heard. Pulmonary:     Effort: Pulmonary effort is normal.     Breath sounds: No stridor. No wheezing, rhonchi or rales.  Abdominal:     General: Abdomen is flat. Bowel sounds are normal. There is no distension.     Palpations: Abdomen is soft. There is no fluid wave, hepatomegaly or mass.  Tenderness: There is no abdominal tenderness. There is no guarding.  Musculoskeletal:        General: Normal range of motion.     Cervical back: Neck supple.     Right lower leg: No edema.     Left lower leg: No edema.  Lymphadenopathy:     Cervical: No cervical adenopathy.  Skin:    General: Skin is warm and dry.  Neurological:     General: No focal deficit present.     Mental Status: She is alert.  Psychiatric:        Mood and Affect: Mood normal.        Behavior: Behavior normal.    Lab Results  Component Value Date   WBC 10.0 02/04/2021   HGB 10.2 (L) 02/04/2021   HCT 30.9 (L) 02/04/2021   PLT 258.0 02/04/2021   GLUCOSE 87  02/04/2021   CHOL 139 03/02/2020   TRIG 72 03/02/2020   HDL 41 (L) 03/02/2020   LDLCALC 83 03/02/2020   ALT 10 09/17/2020   AST 15 09/17/2020   NA 141 02/04/2021   K 4.1 02/04/2021   CL 104 02/04/2021   CREATININE 0.83 02/04/2021   BUN 13 02/04/2021   CO2 29 02/04/2021   TSH 2.04 02/04/2021   HGBA1C 6.8 (H) 02/04/2021   MICROALBUR 2.5 03/02/2020    No results found.  Assessment & Plan:   Jaime [name] was seen today for diabetes and hypertension.  Diagnoses and all orders for this visit:  Essential hypertension- Her blood pressure is adequately well controlled. -     CBC with Differential/Platelet; Future -     Basic metabolic panel; Future -     Basic metabolic panel -     CBC with Differential/Platelet  GOITER, MULTINODULAR- She has no neck symptoms and is euthyroid. -     TSH; Future -     TSH  Type II diabetes mellitus with manifestations ([place])- Her blood sugar is adequately well controlled. -     Basic metabolic panel; Future -     Hemoglobin A1c; Future -     Hemoglobin A1c -     Basic metabolic panel  Normocytic anemia- Her H&H are stable.  I have discontinued Jerzey Dechene. Mallette's traMADol. I am also having her maintain her multivitamins ther. w/minerals, aspirin, metoprolol succinate, atorvastatin, amLODipine, losartan, and pantoprazole.  No orders of the defined types were placed in this encounter.    Follow-up: Return in about 4 months (around 06/06/2021).  Scarlette Calico, MD

## 2021-02-04 NOTE — Patient Instructions (Signed)

## 2021-02-15 ENCOUNTER — Ambulatory Visit (INDEPENDENT_AMBULATORY_CARE_PROVIDER_SITE_OTHER): Payer: 59 | Admitting: Family

## 2021-02-15 ENCOUNTER — Encounter (HOSPITAL_BASED_OUTPATIENT_CLINIC_OR_DEPARTMENT_OTHER): Payer: Self-pay | Admitting: Family

## 2021-02-15 ENCOUNTER — Other Ambulatory Visit: Payer: Self-pay

## 2021-02-15 VITALS — BP 118/76 | HR 62 | Ht 66.0 in | Wt 178.4 lb

## 2021-02-15 DIAGNOSIS — E785 Hyperlipidemia, unspecified: Secondary | ICD-10-CM | POA: Diagnosis not present

## 2021-02-15 DIAGNOSIS — R0600 Dyspnea, unspecified: Secondary | ICD-10-CM

## 2021-02-15 DIAGNOSIS — R0609 Other forms of dyspnea: Secondary | ICD-10-CM

## 2021-02-15 DIAGNOSIS — I10 Essential (primary) hypertension: Secondary | ICD-10-CM

## 2021-02-15 DIAGNOSIS — I25118 Atherosclerotic heart disease of native coronary artery with other forms of angina pectoris: Secondary | ICD-10-CM

## 2021-02-15 MED ORDER — METOPROLOL TARTRATE 25 MG PO TABS: ORAL_TABLET | ORAL | 0 refills | Status: DC

## 2021-02-15 NOTE — Progress Notes (Signed)
Office Visit    Patient Name: Jaime [name] Date of Encounter: 02/15/2021  PCP:  Janith Lima, MD   [place]  Cardiologist:  Freada Bergeron, MD  Advanced Practice Provider:  No care team member to display Electrophysiologist:  None   Chief Complaint    Jaime [name] is a 65 y.o. 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, carotid artery disease, coronary artery disease presents today for follow-up  Past Medical History    Past Medical History:  Diagnosis Date   Anemia    Dyspnea    with exertion    Endometriosis    GERD (gastroesophageal reflux disease)    Hypertension    Meningioma ([place])    Nontoxic multinodular goiter    Occlusion and stenosis of carotid artery without mention of cerebral infarction    Peptic ulcer disease    PVD (peripheral vascular disease) ([place])    Past Surgical History:  Procedure Laterality Date   CT RADIATION THERAPY GUIDE     CT done yearly for menigioma with radiation if needed   CYST EXCISION     head   CYST EXCISION Left    x 2    cyst removed     left wrist   endometriosis surgery      LAPAROSCOPIC APPENDECTOMY N/A 11/02/2020   Procedure: APPENDECTOMY LAPAROSCOPIC;  Surgeon: Clovis Riley, MD;  Location: [facility];  Service: General;  Laterality: N/A;   ORIF ANKLE FRACTURE     right   OVARIAN CYST REMOVAL     right   SHOULDER SURGERY Right    Spur removal   [employer]  2012   right thumb   TUBAL LIGATION     VIDEO BRONCHOSCOPY  11/10/2011   Procedure: VIDEO BRONCHOSCOPY WITH FLUORO;  Surgeon: Tanda Rockers, MD;  Location: [facility];  Service: Cardiopulmonary;  Laterality: Bilateral;    Allergies  No Known Allergies  History of Present Illness    AHSLEY [name] is a 65 y.o. 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 Johney Frame.  She had stress test September 2021 ordered by her primary care provider showing reversible perfusion defect in apical anterior wall suggestive of small area of ischemia which was overall low risk study.  She was evaluated by Dr. Johney Frame and recommended for initiation of GDMT for secondary prevention of coronary disease.  She was started on metoprolol succinate 25 mg daily, losartan 50 mg daily, and atorvastatin was increased to 40 mg daily.  Aspirin 325 mg discontinued due to carotid artery occlusion.  Her losartan was eventually uptitrated to 100 mg daily for optimal blood pressure control.  She was admitted 3/31 - 09/21/2020 with perforated acute appendicitis.  She was treated with IV antibiotics and surgery deferred.  She was admitted 11/02/2020 for appendectomy.  She presents today for follow-up.  Notes persistent dyspnea on exertion with hills or stairs.  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  She endorses following a low-sodium, heart healthy diet.  She reports no chest pain, pressure, tightness.  Denies edema, orthopnea, PND, palpitations.  We discussed her presumed coronary disease and she is interested in  pursuing further work-up.  EKGs/Labs/Other Studies Reviewed:   The following studies were reviewed today:  Myoview 02/2020: There was <37m ST segment depression noted during stress. The left ventricular ejection fraction is normal (55-65%). Nuclear stress EF: 62%. Defect 1: There is a small defect of mild severity present in the apical anterior location. Findings consistent with ischemia. This is a low risk study.   1. Reversible perfusion defect in apical anterior wall suggestive of small area of ischemia 2. Low risk study given small area of ischemia.  Would recommend referral to cardiology for further evaluation   TTE  09/01/2011: Study Conclusions   - Procedure narrative: Transthoracic echocardiography. Image    quality was poor.  - Left ventricle: The cavity size was normal. Wall thickness    was normal. Systolic function was normal. The estimated    ejection fraction was in the range of 55% to 60%. Left    ventricular diastolic function parameters were normal.  Transthoracic echocardiography.  M-mode, complete 2D,  spectral Doppler, and color Doppler.  Height:  Height:  167.6cm. Height: 66in.  Weight:  Weight: 88.9kg. Weight:  195.6lb.  Body mass index:  BMI: 31.6kg/m^2.  Body surface  area:    BSA: 1.956m.  Blood pressure:     109/67.  Patient  status:  Inpatient.  Location:  Echo laboratory.   ------------------------------------------------------------   ------------------------------------------------------------  Left ventricle:  The cavity size was normal. Wall thickness  was normal. Systolic function was normal. The estimated  ejection fraction was in the range of 55% to 60%. The  transmitral flow pattern was normal. The deceleration time  of the early transmitral flow velocity was normal. The  pulmonary vein flow pattern was normal. The tissue Doppler  parameters were normal. Left ventricular diastolic function  parameters were normal.   ------------------------------------------------------------  Aortic valve:   Structurally normal valve.   Cusp separation  was normal.  Doppler:  Transvalvular velocity was within the  normal range. There was no stenosis.  No regurgitation.   ------------------------------------------------------------  Aorta:  The aorta was normal, not dilated, and non-diseased.     ------------------------------------------------------------  Mitral valve:   Structurally normal valve.   Leaflet  separation was normal.  Doppler:  Transvalvular velocity was  within the normal range. There was no evidence for stenosis.   Trivial regurgitation.    ------------------------------------------------------------  Left atrium:  The atrium was normal in size.   ------------------------------------------------------------  Atrial septum:  Poorly visualized.   ------------------------------------------------------------  Right ventricle:  The cavity size was normal. Wall thickness  was normal. The moderator band was prominent. Systolic  function was normal.   ------------------------------------------------------------  Pulmonic valve:   Poorly visualized.  Doppler:   No  significant regurgitation.   ------------------------------------------------------------  Tricuspid valve:  Poorly visualized.  Structurally normal  valve.   Leaflet separation was normal.  Doppler:  Transvalvular velocity was within the normal range.  No  regurgitation.   ------------------------------------------------------------  Pulmonary artery:   Poorly visualized.   ------------------------------------------------------------  Right atrium:  Poorly visualized.   ------------------------------------------------------------  Pericardium:  There was no pericardial effusion.        Myoview 2013: Findings: Multiplanar SPECT imaging shows no fixed or reversible  perfusion defect within the left ventricular myocardium.  Left  jugular cavity size appears grossly normal.   Images obtained with cardiac gating displayed using a surface  rendering algorithm show no segmental wall motion abnormality.   Left ventricular end-diastolic volume is calculated at 51 ml.  Left  ventricular end systolic volume is calculated 11 ml.  The derived  left ventricular ejection fraction is probably over estimated at  70%.   IMPRESSION:  No evidence for inducible ischemia in the left ventricle.   Normal left ventricular ejection fraction.     Carotid artery doppler Summary:   - Right ICA appears chronically occluded. No significant    left ICA stenosis or plaque  noted in the ICA.  - Findings consistent with .   EKG:  EKG is ordered today.  The ekg ordered today demonstrates NSR 62 bpm with no acute ST/T wave changes.  Recent Labs: 03/02/2020: Brain Natriuretic Peptide 42 09/17/2020: ALT 10 09/19/2020: Magnesium 1.6 02/04/2021: BUN 13; Creatinine, Ser 0.83; Hemoglobin 10.2; Platelets 258.0; Potassium 4.1; Sodium 141; TSH 2.04  Recent Lipid Panel    Component Value Date/Time   CHOL 139 03/02/2020 1458   TRIG 72 03/02/2020 1458   TRIG 41 05/05/2006 1206   HDL 41 (L) 03/02/2020 1458   CHOLHDL 3.4 03/02/2020 1458   VLDL 19.6 01/29/2019 1504   LDLCALC 83 03/02/2020 1458   Home Medications   Current Meds  Medication Sig   amLODipine (NORVASC) 5 MG tablet Take 5 mg by mouth daily.   aspirin 325 MG tablet Take 325 mg by mouth daily.    atorvastatin (LIPITOR) 40 MG tablet Take 1 tablet (40 mg total) by mouth daily.   losartan (COZAAR) 100 MG tablet Take 100 mg by mouth daily.   metoprolol succinate (TOPROL XL) 25 MG 24 hr tablet Take 1 tablet (25 mg total) by mouth daily after supper.   Multiple Vitamins-Minerals (MULTIVITAMINS THER. W/MINERALS) TABS Take 1 tablet by mouth daily as needed (nutrition).   pantoprazole (PROTONIX) 40 MG tablet TAKE 1 TABLET BY MOUTH EVERY DAY IN THE MORNING     Review of Systems      All other systems reviewed and are otherwise negative except as noted above.  Physical Exam    VS:  BP 118/76   Pulse 62   Ht '5\' 6"'$  (1.676 m)   Wt 178 lb 6.4 oz (80.9 kg)   SpO2 98%   BMI 28.79 kg/m  , BMI Body mass index is 28.79 kg/m.  Wt Readings from Last 3 Encounters:  02/15/21 178 lb 6.4 oz (80.9 kg)  02/04/21 179 lb 12.8 oz (81.6 kg)  11/02/20 170 lb (77.1 kg)     GEN: Well nourished, well developed, in no acute distress. HEENT: normal. Neck: Supple, no JVD, carotid bruits, or masses. Cardiac: RRR, no murmurs, rubs, or gallops. No clubbing, cyanosis, edema.  Radials/PT 2+ and equal bilaterally.  Respiratory:   Respirations regular and unlabored, clear to auscultation bilaterally. GI: Soft, nontender, nondistended. MS: No deformity or atrophy. Skin: Warm and dry, no rash. Neuro:  Strength and sensation are intact. Psych: Normal affect.  Assessment & Plan    CAD /DOE - Reports persistent DOE despite escalation of medical therapy. As such, plan for cardiac CTA to assess degree of coronary atherosclerosis. If non obstructive, consider addition of Imdur vs enrollment in exercise program such as the PREP program. GDMT includes aspirin, metoprolol, losartan, atorvastatin. Heart healthy diet and regular cardiovascular exercise encouraged.    Carotid artery disease -carotid artery ultrasound 2013 with occluded RCA.  Continue aspirin 325 mg daily per neurology.  Continue atorvastatin 40 mg daily.  HTN - BP well controlled. Continue current antihypertensive regimen.    HLD -continue atorvastatin 40 mg daily.  She is due for repeat lipid  panel since increased dose which we will order.  If LDL not at goal of less than 70 we will plan to further uptitrate.  DM2 - Continue to follow with PCP.  If additional agent needed consider SGLT2 I for cardioprotective benefit.  Disposition: Follow up in 3-4 month(s) with Dr. Johney Frame or APP.  Signed, Loel Dubonnet, NP 02/15/2021, 3:19 PM [place]

## 2021-02-15 NOTE — Patient Instructions (Addendum)
Medication Instructions:  Continue your current medications.   You will take Metoprolol Tartrate (this is the short acting version of metoprolol) one '25mg'$  tablet two hours prior to your cardaic CTA. you should still take your same dose of Metoprolol Succinate the evening prior as scheduled.   *If you need a refill on your cardiac medications before your next appointment, please call your pharmacy*   Lab Work: Your physician recommends that you return for lab work for fasting lipid panel, CMP at [employer] [facility] [facility] 3-5 days days prior to your cardiac CTA.   If you have labs (blood work) drawn today and your tests are completely normal, you will receive your results only by: [place] (if you have MyChart) OR A paper copy in the mail If you have any lab test that is abnormal or we need to change your treatment, we will call you to review the results.   Testing/Procedures: Your physician has requested that you have cardiac CT. Cardiac computed tomography (CT) is a painless test that uses an x-ray machine to take clear, detailed pictures of your heart. Please follow instruction sheet as given.  Follow-Up: At [facility], you and your health needs are our priority.  As part of our continuing mission to provide you with exceptional heart care, we have created designated Provider Care Teams.  These Care Teams include your primary Cardiologist (physician) and Advanced Practice Providers (APPs -  Physician Assistants and Nurse Practitioners) who all work together to provide you with the care you need, when you need it.  We recommend signing up for the patient portal called "MyChart".  Sign up information is provided on this After Visit Summary.  MyChart is used to connect with patients for Virtual Visits (Telemedicine).  Patients are able to view lab/test results, encounter notes, upcoming appointments, etc.  Non-urgent messages can be sent to your provider as well.   To learn more about what you can do  with MyChart, go to [email or web address].    Your next appointment:   3-4 month(s)  The format for your next appointment:   In Person  Provider:   You may see Freada Bergeron, MD or one of the following Advanced Practice Providers on your designated Care Team:   Richardson Dopp, PA-C [place], [place]   Other Instructions    Your cardiac CT will be scheduled at one of the below locations:   [facility] [address] [place], [place] [address] [phone]  If scheduled at [facility], please arrive at the [facility] main entrance (entrance A) of [facility] 30 minutes prior to test start time. Proceed to the [facility] Radiology Department (first floor) to check-in and test prep.  Please follow these instructions carefully (unless otherwise directed):  On the Night Before the Test: Be sure to Drink plenty of water. Do not consume any caffeinated/decaffeinated beverages or chocolate 12 hours prior to your test. Do not take any antihistamines 12 hours prior to your test.  On the Day of the Test: Drink plenty of water until 1 hour prior to the test. Do not eat any food 4 hours prior to the test. You may take your regular medications prior to the test.  Take metoprolol (Lopressor) two hours prior to test. FEMALES- please wear underwire-free bra if available, avoid dresses & tight clothing  Take Metoprolol Tartrate 25 mg one tablet two hours prior to your cardiac CT. You will still take your Metoprolol Succinate as scheduled that  morning.        After the Test: Drink plenty of water. After receiving IV contrast, you may experience a mild flushed feeling. This is normal. On occasion, you may experience a mild rash up to 24 hours after the test. This is not dangerous. If this occurs, you can take Benadryl 25 mg and increase your fluid intake. If you experience trouble breathing, this can be serious. If it is severe call 911 IMMEDIATELY. If  it is mild, please call our office. If you take any of these medications: Glipizide/Metformin, Avandament, Glucavance, please do not take 48 hours after completing test unless otherwise instructed.  Please allow 2-4 weeks for scheduling of routine cardiac CTs. Some insurance companies require a pre-authorization which may delay scheduling of this test.   For non-scheduling related questions, please contact the cardiac imaging nurse navigator should you have any questions/concerns: Marchia Bond, Cardiac Imaging Nurse Navigator Gordy Clement, Cardiac Imaging Nurse Navigator [facility] Direct Office Dial: [phone]   For scheduling needs, including cancellations and rescheduling, please call [place], [phone].

## 2021-02-16 ENCOUNTER — Encounter (HOSPITAL_BASED_OUTPATIENT_CLINIC_OR_DEPARTMENT_OTHER): Payer: Self-pay | Admitting: Family

## 2021-02-19 ENCOUNTER — Other Ambulatory Visit: Payer: Self-pay | Admitting: *Deleted

## 2021-02-19 ENCOUNTER — Telehealth (HOSPITAL_COMMUNITY): Payer: Self-pay | Admitting: Emergency Medicine

## 2021-02-19 DIAGNOSIS — E785 Hyperlipidemia, unspecified: Secondary | ICD-10-CM

## 2021-02-19 LAB — COMPREHENSIVE METABOLIC PANEL
ALT: 8 IU/L (ref 0–32)
AST: 19 IU/L (ref 0–40)
Albumin/Globulin Ratio: 1.4 (ref 1.2–2.2)
Albumin: 4.1 g/dL (ref 3.8–4.8)
Alkaline Phosphatase: 127 IU/L — ABNORMAL HIGH (ref 44–121)
BUN/Creatinine Ratio: 9 — ABNORMAL LOW (ref 12–28)
BUN: 8 mg/dL (ref 8–27)
Bilirubin Total: 0.3 mg/dL (ref 0.0–1.2)
CO2: 23 mmol/L (ref 20–29)
Calcium: 9.4 mg/dL (ref 8.7–10.3)
Chloride: 101 mmol/L (ref 96–106)
Creatinine, Ser: 0.86 mg/dL (ref 0.57–1.00)
Globulin, Total: 2.9 g/dL (ref 1.5–4.5)
Glucose: 104 mg/dL — ABNORMAL HIGH (ref 65–99)
Potassium: 5.1 mmol/L (ref 3.5–5.2)
Sodium: 139 mmol/L (ref 134–144)
Total Protein: 7 g/dL (ref 6.0–8.5)
eGFR: 75 mL/min/{1.73_m2} (ref >59)

## 2021-02-19 LAB — LIPID PANEL
Chol/HDL Ratio: 3.5 ratio (ref 0.0–4.4)
Cholesterol, Total: 144 mg/dL (ref 100–199)
HDL: 41 mg/dL (ref >39)
LDL Chol Calc (NIH): 90 mg/dL (ref 0–99)
Triglycerides: 66 mg/dL (ref 0–149)
VLDL Cholesterol Cal: 13 mg/dL (ref 5–40)

## 2021-02-19 MED ORDER — ATORVASTATIN CALCIUM 80 MG PO TABS: 80.0000 mg | ORAL_TABLET | Freq: Every day | ORAL | 3 refills | Status: DC

## 2021-02-19 NOTE — Telephone Encounter (Signed)
Reaching out to patient to offer assistance regarding upcoming cardiac imaging study; pt verbalizes understanding of appt date/time, parking situation and where to check in, pre-test NPO status and medications ordered, and verified current allergies; name and call back number provided for further questions should they arise Marchia Bond RN Navigator Cardiac Imaging [facility] [phone] office [phone] cell  '25mg'$  metop 2 hr prior  Denies iv issues Some claustro

## 2021-02-23 ENCOUNTER — Encounter (HOSPITAL_COMMUNITY): Payer: Self-pay

## 2021-02-23 ENCOUNTER — Ambulatory Visit (HOSPITAL_COMMUNITY)
Admission: RE | Admit: 2021-02-23 | Discharge: 2021-02-23 | Disposition: A | Discharge status: Home / Self Care | Payer: 59 | Source: Ambulatory Visit | Attending: Family | Admitting: Family

## 2021-02-23 ENCOUNTER — Other Ambulatory Visit: Payer: Self-pay

## 2021-02-23 DIAGNOSIS — R0600 Dyspnea, unspecified: Secondary | ICD-10-CM | POA: Diagnosis present

## 2021-02-23 DIAGNOSIS — I25118 Atherosclerotic heart disease of native coronary artery with other forms of angina pectoris: Secondary | ICD-10-CM | POA: Diagnosis not present

## 2021-02-23 DIAGNOSIS — R0609 Other forms of dyspnea: Secondary | ICD-10-CM

## 2021-02-23 MED ORDER — NITROGLYCERIN 0.4 MG SL SUBL
0.8000 mg | SUBLINGUAL_TABLET | Freq: Once | SUBLINGUAL | Status: AC
  Administered 2021-02-23: 0.8 mg via SUBLINGUAL

## 2021-02-23 MED ORDER — IOHEXOL 350 MG/ML SOLN
95.0000 mL | Freq: Once | INTRAVENOUS | Status: AC | PRN
  Administered 2021-02-23: 95 mL via INTRAVENOUS

## 2021-02-23 MED ORDER — NITROGLYCERIN 0.4 MG SL SUBL
SUBLINGUAL_TABLET | SUBLINGUAL | Status: AC
  Filled 2021-02-23: qty 2

## 2021-03-23 DIAGNOSIS — R7309 Other abnormal glucose: Secondary | ICD-10-CM | POA: Diagnosis not present

## 2021-03-23 DIAGNOSIS — H2513 Age-related nuclear cataract, bilateral: Secondary | ICD-10-CM | POA: Diagnosis not present

## 2021-03-23 DIAGNOSIS — H5021 Vertical strabismus, right eye: Secondary | ICD-10-CM | POA: Diagnosis not present

## 2021-03-23 DIAGNOSIS — H532 Diplopia: Secondary | ICD-10-CM | POA: Diagnosis not present

## 2021-04-02 ENCOUNTER — Other Ambulatory Visit: Payer: Self-pay | Admitting: Physical Medicine and Rehabilitation

## 2021-04-02 DIAGNOSIS — M1990 Unspecified osteoarthritis, unspecified site: Secondary | ICD-10-CM

## 2021-04-15 ENCOUNTER — Ambulatory Visit
Admission: RE | Admit: 2021-04-15 | Discharge: 2021-04-15 | Disposition: A | Discharge status: Home / Self Care | Payer: 59 | Source: Ambulatory Visit | Attending: Physical Medicine and Rehabilitation | Admitting: Physical Medicine and Rehabilitation

## 2021-04-15 ENCOUNTER — Other Ambulatory Visit: Payer: Self-pay

## 2021-04-15 DIAGNOSIS — M1990 Unspecified osteoarthritis, unspecified site: Secondary | ICD-10-CM

## 2021-04-21 ENCOUNTER — Other Ambulatory Visit: Payer: 59 | Admitting: *Deleted

## 2021-04-21 ENCOUNTER — Other Ambulatory Visit: Payer: Self-pay

## 2021-04-21 DIAGNOSIS — E785 Hyperlipidemia, unspecified: Secondary | ICD-10-CM

## 2021-04-21 LAB — HEPATIC FUNCTION PANEL
ALT: 10 IU/L (ref 0–32)
AST: 18 IU/L (ref 0–40)
Albumin: 4.1 g/dL (ref 3.8–4.8)
Alkaline Phosphatase: 129 IU/L — ABNORMAL HIGH (ref 44–121)
Bilirubin Total: 0.6 mg/dL (ref 0.0–1.2)
Bilirubin, Direct: 0.16 mg/dL (ref 0.00–0.40)
Total Protein: 6.6 g/dL (ref 6.0–8.5)

## 2021-04-21 LAB — LIPID PANEL
Chol/HDL Ratio: 3.4 ratio (ref 0.0–4.4)
Cholesterol, Total: 131 mg/dL (ref 100–199)
HDL: 39 mg/dL — ABNORMAL LOW (ref >39)
LDL Chol Calc (NIH): 77 mg/dL (ref 0–99)
Triglycerides: 74 mg/dL (ref 0–149)
VLDL Cholesterol Cal: 15 mg/dL (ref 5–40)

## 2021-05-11 ENCOUNTER — Other Ambulatory Visit: Payer: Self-pay

## 2021-05-11 MED ORDER — LOSARTAN POTASSIUM 100 MG PO TABS: 100.0000 mg | ORAL_TABLET | Freq: Every day | ORAL | 2 refills | Status: DC

## 2021-06-07 ENCOUNTER — Other Ambulatory Visit: Payer: Self-pay | Admitting: Internal Medicine

## 2021-06-07 DIAGNOSIS — K219 Gastro-esophageal reflux disease without esophagitis: Secondary | ICD-10-CM

## 2021-06-08 ENCOUNTER — Ambulatory Visit (INDEPENDENT_AMBULATORY_CARE_PROVIDER_SITE_OTHER): Payer: 59 | Admitting: Internal Medicine

## 2021-06-08 ENCOUNTER — Other Ambulatory Visit: Payer: Self-pay

## 2021-06-08 ENCOUNTER — Encounter: Payer: Self-pay | Admitting: Internal Medicine

## 2021-06-08 VITALS — BP 132/86 | HR 74 | Temp 97.8°F | Resp 16 | Ht 66.0 in | Wt 186.0 lb

## 2021-06-08 DIAGNOSIS — I10 Essential (primary) hypertension: Secondary | ICD-10-CM

## 2021-06-08 DIAGNOSIS — D649 Anemia, unspecified: Secondary | ICD-10-CM | POA: Diagnosis not present

## 2021-06-08 DIAGNOSIS — E118 Type 2 diabetes mellitus with unspecified complications: Secondary | ICD-10-CM | POA: Diagnosis not present

## 2021-06-08 DIAGNOSIS — I5189 Other ill-defined heart diseases: Secondary | ICD-10-CM | POA: Diagnosis not present

## 2021-06-08 DIAGNOSIS — R252 Cramp and spasm: Secondary | ICD-10-CM | POA: Diagnosis not present

## 2021-06-08 DIAGNOSIS — Z23 Encounter for immunization: Secondary | ICD-10-CM | POA: Diagnosis not present

## 2021-06-08 LAB — CBC WITH DIFFERENTIAL/PLATELET
Basophils Absolute: 0.1 10*3/uL (ref 0.0–0.1)
Basophils Relative: 0.5 % (ref 0.0–3.0)
Eosinophils Absolute: 0.4 10*3/uL (ref 0.0–0.7)
Eosinophils Relative: 3.2 % (ref 0.0–5.0)
HCT: 31.2 % — ABNORMAL LOW (ref 36.0–46.0)
Hemoglobin: 10.1 g/dL — ABNORMAL LOW (ref 12.0–15.0)
Lymphocytes Relative: 20.6 % (ref 12.0–46.0)
Lymphs Abs: 2.9 10*3/uL (ref 0.7–4.0)
MCHC: 32.5 g/dL (ref 30.0–36.0)
MCV: 83.8 fl (ref 78.0–100.0)
Monocytes Absolute: 1.1 10*3/uL — ABNORMAL HIGH (ref 0.1–1.0)
Monocytes Relative: 7.9 % (ref 3.0–12.0)
Neutro Abs: 9.4 10*3/uL — ABNORMAL HIGH (ref 1.4–7.7)
Neutrophils Relative %: 67.8 % (ref 43.0–77.0)
Platelets: 272 10*3/uL (ref 150.0–400.0)
RBC: 3.73 Mil/uL — ABNORMAL LOW (ref 3.87–5.11)
RDW: 18.5 % — ABNORMAL HIGH (ref 11.5–15.5)
WBC: 13.9 10*3/uL — ABNORMAL HIGH (ref 4.0–10.5)

## 2021-06-08 LAB — BASIC METABOLIC PANEL
BUN: 18 mg/dL (ref 6–23)
CO2: 31 mEq/L (ref 19–32)
Calcium: 8.5 mg/dL (ref 8.4–10.5)
Chloride: 103 mEq/L (ref 96–112)
Creatinine, Ser: 0.9 mg/dL (ref 0.40–1.20)
GFR: 67.43 mL/min (ref >60.00)
Glucose, Bld: 93 mg/dL (ref 70–99)
Potassium: 3.9 mEq/L (ref 3.5–5.1)
Sodium: 138 mEq/L (ref 135–145)

## 2021-06-08 LAB — HEMOGLOBIN A1C: Hgb A1c MFr Bld: 7 % — ABNORMAL HIGH (ref 4.6–6.5)

## 2021-06-08 LAB — MICROALBUMIN / CREATININE URINE RATIO
Creatinine,U: 142 mg/dL
Microalb Creat Ratio: 0.5 mg/g (ref 0.0–30.0)
Microalb, Ur: 0.8 mg/dL (ref 0.0–1.9)

## 2021-06-08 LAB — C-REACTIVE PROTEIN: CRP: 1.4 mg/dL (ref 0.5–20.0)

## 2021-06-08 LAB — MAGNESIUM: Magnesium: 1.9 mg/dL (ref 1.5–2.5)

## 2021-06-08 LAB — CK: Total CK: 158 U/L (ref 7–177)

## 2021-06-08 NOTE — Patient Instructions (Signed)
Type 2 Diabetes Mellitus, Diagnosis, Adult ?Type 2 diabetes (type 2 diabetes mellitus) is a long-term, or chronic, disease. In type 2 diabetes, one or both of these problems may be present: ?The pancreas does not make enough of a hormone called insulin. ?Cells in the body do not respond properly to the insulin that the body makes (insulin resistance). ?Normally, insulin allows blood sugar (glucose) to enter cells in the body. The cells use glucose for energy. Insulin resistance or lack of insulin causes excess glucose to build up in the blood instead of going into cells. This causes high blood glucose (hyperglycemia).  ?What are the causes? ?The exact cause of type 2 diabetes is not known. ?What increases the risk? ?The following factors may make you more likely to develop this condition: ?Having a family member with type 2 diabetes. ?Being overweight or obese. ?Being inactive (sedentary). ?Having been diagnosed with insulin resistance. ?Having a history of prediabetes, diabetes when you were pregnant (gestational diabetes), or polycystic ovary syndrome (PCOS). ?What are the signs or symptoms? ?In the early stage of this condition, you may not have symptoms. Symptoms develop slowly and may include: ?Increased thirst or hunger. ?Increased urination. ?Unexplained weight loss. ?Tiredness (fatigue) or weakness. ?Vision changes, such as blurry vision. ?Dark patches on the skin. ?How is this diagnosed? ?This condition is diagnosed based on your symptoms, your medical history, a physical exam, and your blood glucose level. Your blood glucose may be checked with one or more of the following blood tests: ?A fasting blood glucose (FBG) test. You will not be allowed to eat (you will fast) for 8 hours or longer before a blood sample is taken. ?A random blood glucose test. This test checks blood glucose at any time of day regardless of when you ate. ?An A1C (hemoglobin A1C) blood test. This test provides information about blood  glucose levels over the previous 2-3 months. ?An oral glucose tolerance test (OGTT). This test measures your blood glucose at two times: ?After fasting. This is your baseline blood glucose level. ?Two hours after drinking a beverage that contains glucose. ?You may be diagnosed with type 2 diabetes if: ?Your fasting blood glucose level is 126 mg/dL (7.0 mmol/L) or higher. ?Your random blood glucose level is 200 mg/dL (11.1 mmol/L) or higher. ?Your A1C level is 6.5% or higher. ?Your oral glucose tolerance test result is higher than 200 mg/dL (11.1 mmol/L). ?These blood tests may be repeated to confirm your diagnosis. ?How is this treated? ?Your treatment may be managed by a specialist called an endocrinologist. Type 2 diabetes may be treated by following instructions from your health care provider about: ?Making dietary and lifestyle changes. These may include: ?Following a personalized nutrition plan that is developed by a registered dietitian. ?Exercising regularly. ?Finding ways to manage stress. ?Checking your blood glucose level as often as told. ?Taking diabetes medicines or insulin daily. This helps to keep your blood glucose levels in the healthy range. ?Taking medicines to help prevent complications from diabetes. Medicines may include: ?Aspirin. ?Medicine to lower cholesterol. ?Medicine to control blood pressure. ?Your health care provider will set treatment goals for you. Your goals will be based on your age, other medical conditions you have, and how you respond to diabetes treatment. Generally, the goal of treatment is to maintain the following blood glucose levels: ?Before meals: 80-130 mg/dL (4.4-7.2 mmol/L). ?After meals: below 180 mg/dL (10 mmol/L). ?A1C level: less than 7%. ?Follow these instructions at home: ?Questions to ask your health care provider ?  Consider asking the following questions: ?Should I meet with a certified diabetes care and education specialist? ?What diabetes medicines do I need,  and when should I take them? ?What equipment will I need to manage my diabetes at home? ?How often do I need to check my blood glucose? ?Where can I find a support group for people with diabetes? ?What number can I call if I have questions? ?When is my next appointment? ?General instructions ?Take over-the-counter and prescription medicines only as told by your health care provider. ?Keep all follow-up visits. This is important. ?Where to find more information ?For help and guidance and for more information about diabetes, please visit: ?American Diabetes Association (ADA): www.diabetes.org ?American Association of Diabetes Care and Education Specialists (ADCES): www.diabeteseducator.org ?International Diabetes Federation (IDF): www.idf.org ?Contact a health care provider if: ?Your blood glucose is at or above 240 mg/dL (13.3 mmol/L) for 2 days in a row. ?You have been sick or have had a fever for 2 days or longer, and you are not getting better. ?You have any of the following problems for more than 6 hours: ?You cannot eat or drink. ?You have nausea and vomiting. ?You have diarrhea. ?Get help right away if: ?You have severe hypoglycemia. This means your blood glucose is lower than 54 mg/dL (3.0 mmol/L). ?You become confused or you have trouble thinking clearly. ?You have difficulty breathing. ?You have moderate or large ketone levels in your urine. ?These symptoms may represent a serious problem that is an emergency. Do not wait to see if the symptoms will go away. Get medical help right away. Call your local emergency services (911 in the [place] Do not drive yourself to the hospital. ?Summary ?Type 2 diabetes mellitus is a long-term, or chronic, disease. In type 2 diabetes, the pancreas does not make enough of a hormone called insulin, or cells in the body do not respond properly to insulin that the body makes. ?This condition is treated by making dietary and lifestyle changes and taking diabetes medicines or  insulin. ?Your health care provider will set treatment goals for you. Your goals will be based on your age, other medical conditions you have, and how you respond to diabetes treatment. ?Keep all follow-up visits. This is important. ?This information is not intended to replace advice given to you by your health care provider. Make sure you discuss any questions you have with your health care provider. ?Document Revised: 08/31/2020 Document Reviewed: 08/31/2020 ?Elsevier Patient Education ? 2022 [employer] Inc[employer] ? ?

## 2021-06-08 NOTE — Progress Notes (Signed)
Subjective:  Patient ID: Jaime [name], female    DOB: 06/29/1955  Age: 65 y.o. MRN: [id number]  CC: Hypertension, Anemia, and Diabetes  This visit occurred during the SARS-CoV-2 public health emergency.  Safety protocols were in place, including screening questions prior to the visit, additional usage of staff PPE, and extensive cleaning of exam room while observing appropriate contact time as indicated for disinfecting solutions.    HPI Jaime [name] presents for f/up -  She continues to complain of widespread muscle cramps.  She denies joint pain or swelling.  About 5 days ago she had a brief episode of fatigue and dizziness but those symptoms have resolved.  She continues to complain of chronic, unchanged low back pain.  Outpatient Medications Prior to Visit  Medication Sig Dispense Refill   aspirin 325 MG tablet Take 325 mg by mouth daily.      atorvastatin (LIPITOR) 80 MG tablet Take 1 tablet (80 mg total) by mouth daily. 90 tablet 3   losartan (COZAAR) 100 MG tablet Take 1 tablet (100 mg total) by mouth daily. 90 tablet 2   metoprolol succinate (TOPROL XL) 25 MG 24 hr tablet Take 1 tablet (25 mg total) by mouth daily after supper. 90 tablet 3   Multiple Vitamins-Minerals (MULTIVITAMINS THER. W/MINERALS) TABS Take 1 tablet by mouth daily as needed (nutrition).     pantoprazole (PROTONIX) 40 MG tablet TAKE 1 TABLET BY MOUTH EVERY DAY IN THE MORNING 30 tablet 5   amLODipine (NORVASC) 5 MG tablet Take 5 mg by mouth daily.     metoprolol tartrate (LOPRESSOR) 25 MG tablet Take one tablet two hours prior to your cardiac CTA. 1 tablet 0   No facility-administered medications prior to visit.    ROS Review of Systems  Constitutional:  Negative for appetite change, chills, diaphoresis, fatigue, fever and unexpected weight change.  HENT: Negative.    Eyes: Negative.   Respiratory:  Negative for cough, shortness of breath and wheezing.   Gastrointestinal:  Negative for abdominal  pain, constipation, diarrhea, nausea and vomiting.  Endocrine: Negative.   Genitourinary: Negative.  Negative for difficulty urinating.  Musculoskeletal:  Positive for myalgias. Negative for arthralgias and joint swelling.  Skin: Negative.  Negative for color change, pallor and rash.  Neurological: Negative.  Negative for dizziness, weakness, light-headedness and headaches.  Hematological:  Negative for adenopathy. Does not bruise/bleed easily.  Psychiatric/Behavioral: Negative.     Objective:  BP 132/86 (BP Location: Left Arm, Patient Position: Sitting, Cuff Size: Large)    Pulse 74    Temp 97.8 F (36.6 C) (Oral)    Ht 5\' 6"  (1.676 m)    Wt 186 lb (84.4 kg)    SpO2 99%    BMI 30.02 kg/m   BP Readings from Last 3 Encounters:  06/08/21 132/86  02/23/21 119/73  02/15/21 118/76    Wt Readings from Last 3 Encounters:  06/08/21 186 lb (84.4 kg)  02/15/21 178 lb 6.4 oz (80.9 kg)  02/04/21 179 lb 12.8 oz (81.6 kg)    Physical Exam Vitals reviewed.  Constitutional:      Appearance: Normal appearance. She is not ill-appearing.  HENT:     Nose: Nose normal.     Mouth/Throat:     Mouth: Mucous membranes are moist.  Eyes:     General: No scleral icterus.    Conjunctiva/sclera: Conjunctivae normal.  Cardiovascular:     Rate and Rhythm: Normal rate and regular rhythm.     Heart  sounds: No murmur heard. Pulmonary:     Effort: Pulmonary effort is normal.     Breath sounds: No stridor. No wheezing, rhonchi or rales.  Abdominal:     General: Abdomen is flat.     Palpations: There is no mass.     Tenderness: There is no abdominal tenderness. There is no guarding or rebound.     Hernia: No hernia is present.  Musculoskeletal:        General: Normal range of motion.     Cervical back: Neck supple. No tenderness.     Right lower leg: No edema.     Left lower leg: No edema.  Lymphadenopathy:     Cervical: No cervical adenopathy.  Skin:    General: Skin is warm and dry.   Neurological:     General: No focal deficit present.     Mental Status: She is alert. Mental status is at baseline.  Psychiatric:        Mood and Affect: Mood normal.        Behavior: Behavior normal.    Lab Results  Component Value Date   WBC 13.9 (H) 06/08/2021   HGB 10.1 (L) 06/08/2021   HCT 31.2 (L) 06/08/2021   PLT 272.0 06/08/2021   GLUCOSE 93 06/08/2021   CHOL 131 04/21/2021   TRIG 74 04/21/2021   HDL 39 (L) 04/21/2021   LDLCALC 77 04/21/2021   ALT 10 04/21/2021   AST 18 04/21/2021   NA 138 06/08/2021   K 3.9 06/08/2021   CL 103 06/08/2021   CREATININE 0.90 06/08/2021   BUN 18 06/08/2021   CO2 31 06/08/2021   TSH 2.04 02/04/2021   HGBA1C 7.0 (H) 06/08/2021   MICROALBUR 0.8 06/08/2021    MR LUMBAR SPINE WO CONTRAST  Result Date: 04/16/2021 CLINICAL DATA:  Patient complains of low back, bilateral buttock and right leg pain for 2 months. Patient denies history of surgery or cancer. EXAM: MRI LUMBAR SPINE WITHOUT CONTRAST TECHNIQUE: Multiplanar, multisequence MR imaging of the lumbar spine was performed. No intravenous contrast was administered. COMPARISON:  MR lumbar 12/23/2007; X-ray lumbar 09/17/2006. FINDINGS: Segmentation:  Standard. Alignment:  2 mm retrolisthesis of L3 on L4. Vertebrae: No acute fracture, evidence of discitis, or aggressive bone lesion. Conus medullaris and cauda equina: Conus extends to the T12 level. Conus and cauda equina appear normal. Paraspinal and other soft tissues: No acute paraspinal abnormality. Disc levels: Disc spaces: Degenerative disease with disc height loss at L5-S1. Disc desiccation at L3-4. T12-L1: No significant disc bulge. No neural foraminal stenosis. No central canal stenosis. L1-L2: No significant disc bulge. No neural foraminal stenosis. No central canal stenosis. L2-L3: No significant disc bulge. No neural foraminal stenosis. No central canal stenosis. L3-L4: Minimal broad-based disc bulge with a central annular fissure. Mild  bilateral facet arthropathy. No foraminal or central canal stenosis. L4-L5: Minimal broad-based disc bulge. Mild bilateral facet arthropathy. No foraminal or central canal stenosis. L5-S1: Broad-based disc bulge. Mild bilateral facet arthropathy. Mild bilateral foraminal narrowing. No central canal stenosis. IMPRESSION: 1. Lumbar spine spondylosis as described above. 2. No acute osseous injury of the lumbar spine. Electronically Signed   By: Kathreen Devoid M.D.   On: 04/16/2021 13:17    Assessment & Plan:   Jaleigh was seen today for hypertension, anemia and diabetes.  Diagnoses and all orders for this visit:  Essential hypertension- Her blood pressure is adequately well controlled. -     Basic metabolic panel; Future -  Urinalysis, Routine w reflex microscopic; Future -     Urinalysis, Routine w reflex microscopic -     Basic metabolic panel  Diastolic dysfunction without heart failure- I recommended that she start taking an SGLT2 inhibitor. -     Empagliflozin-metFORMIN HCl (SYNJARDY) 5-500 MG TABS; Take 1 tablet by mouth 2 (two) times daily.  Normocytic anemia- Her H&H are stable. -     CBC with Differential/Platelet; Future -     CBC with Differential/Platelet  Type II diabetes mellitus with manifestations ([place])- Her A1c is up to 7.0%.  I recommended that she start taking the combination of metformin and an SGLT2 inhibitor. -     Hemoglobin A1c; Future -     Microalbumin / creatinine urine ratio; Future -     Microalbumin / creatinine urine ratio -     Hemoglobin A1c -     Empagliflozin-metFORMIN HCl (SYNJARDY) 5-500 MG TABS; Take 1 tablet by mouth 2 (two) times daily.  Muscle cramps-  Labs are negative myositis or inflammation.  I think she is symptomatic from the statin.  I have asked her to stop it for a few days to see if the muscle pain resolves.  If it does then I will decrease her dose by 50%. -     Magnesium; Future -     CK; Future -     C-reactive protein; Future -      C-reactive protein -     CK -     Magnesium  Need for vaccination -     Pneumococcal conjugate vaccine 20-valent   I have discontinued Zenia Resides. Runnion's metoprolol tartrate. I am also having her start on Synjardy. Additionally, I am having her maintain her multivitamins ther. w/minerals, aspirin, metoprolol succinate, atorvastatin, losartan, and pantoprazole.  Meds ordered this encounter  Medications   Empagliflozin-metFORMIN HCl (SYNJARDY) 5-500 MG TABS    Sig: Take 1 tablet by mouth 2 (two) times daily.    Dispense:  180 tablet    Refill:  1     Follow-up: Return in about 6 months (around 12/07/2021).  Scarlette Calico, MD

## 2021-06-09 LAB — URINALYSIS, ROUTINE W REFLEX MICROSCOPIC
Bilirubin Urine: NEGATIVE
Hgb urine dipstick: NEGATIVE
Ketones, ur: NEGATIVE
Nitrite: NEGATIVE
RBC / HPF: NONE SEEN (ref 0–?)
Specific Gravity, Urine: 1.025 (ref 1.000–1.030)
Total Protein, Urine: NEGATIVE
Urine Glucose: NEGATIVE
Urobilinogen, UA: 0.2 (ref 0.0–1.0)
pH: 6 (ref 5.0–8.0)

## 2021-06-09 MED ORDER — SYNJARDY 5-500 MG PO TABS: 1.0000 | ORAL_TABLET | Freq: Two times a day (BID) | ORAL | 1 refills | Status: DC

## 2021-06-10 ENCOUNTER — Telehealth: Payer: Self-pay | Admitting: Cardiology

## 2021-06-10 NOTE — Telephone Encounter (Signed)
°*  STAT* If patient is at the pharmacy, call can be transferred to refill team.   1. Which medications need to be refilled? (please list name of each medication and dose if known) amLODipine (NORVASC) 5 MG tablet  2. Which pharmacy/location (including street and city if local pharmacy) is medication to be sent to? [id number] - [place], [place] - [address] AT [place]  3. Do they need a 30 day or 90 day supply? 30 day

## 2021-06-11 MED ORDER — AMLODIPINE BESYLATE 5 MG PO TABS: 5.0000 mg | ORAL_TABLET | Freq: Every day | ORAL | 1 refills | Status: DC

## 2021-06-11 NOTE — Telephone Encounter (Signed)
Rx(s) sent to pharmacy electronically.  Patient notified and voiced understanding.   She voiced understanding.

## 2021-07-01 ENCOUNTER — Other Ambulatory Visit: Payer: Self-pay | Admitting: *Deleted

## 2021-07-01 MED ORDER — METOPROLOL SUCCINATE ER 25 MG PO TB24: 25.0000 mg | ORAL_TABLET | Freq: Every day | ORAL | 0 refills | Status: DC

## 2021-07-17 NOTE — Progress Notes (Deleted)
Cardiology Office Note:    Date:  07/17/2021   ID:  Jaime [name], DOB Mar 11, 1956, MRN [id number]  PCP:  Jaime Lima, MD  [facility] [place] [facility] Cardiologist:  Jaime Bergeron, MD  [facility] Electrophysiologist:  None   Referring MD: Jaime Lima, MD   History of Present Illness:    Jaime [name] is a 66 y.o. 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  She had stress test September 2021 ordered by her primary care provider for dyspnea on exertion showing reversible perfusion defect in apical anterior wall suggestive of small area of ischemia which was overall low risk study.  She was seen in clinic with worsening SOB on exertion. TTE 04/2020 with EF 60-65%, G1DD, normal RV, trivial MR.   She was admitted 3/31 - 09/21/2020 with perforated acute appendicitis.  She was treated with IV antibiotics and surgery deferred.  She was admitted 11/02/2020 for appendectomy.   Last saw Jaime [name] on 02/15/2021 where she was having persistent DOE. Underwent We coronary CTA 02/2021 which showed Ca score 0 and no significant blockages.  Today, ***   Past Medical History:  Diagnosis Date   Anemia    Dyspnea    with exertion    Endometriosis    GERD (gastroesophageal reflux disease)    Hypertension    Meningioma (HCC)    Nontoxic multinodular goiter    Occlusion and stenosis of carotid artery without mention of cerebral infarction    Peptic ulcer disease    PVD (peripheral vascular disease) ([place])     Past Surgical History:  Procedure Laterality Date   CT RADIATION THERAPY GUIDE     CT done yearly for menigioma with radiation if needed   CYST EXCISION     head   CYST EXCISION Left    x 2    cyst removed     left wrist   endometriosis surgery      LAPAROSCOPIC APPENDECTOMY N/A 11/02/2020   Procedure: APPENDECTOMY LAPAROSCOPIC;  Surgeon: Jaime Riley,  MD;  Location: [facility];  Service: General;  Laterality: N/A;   ORIF ANKLE FRACTURE     right   OVARIAN CYST REMOVAL     right   SHOULDER SURGERY Right    Spur removal   [place]  2012   right thumb   TUBAL LIGATION     VIDEO BRONCHOSCOPY  11/10/2011   Procedure: VIDEO BRONCHOSCOPY WITH FLUORO;  Surgeon: Jaime Rockers, MD;  Location: [facility];  Service: Cardiopulmonary;  Laterality: Bilateral;    Current Medications: No outpatient medications have been marked as taking for the 07/20/21 encounter (Appointment) with Jaime Bergeron, MD.     Allergies:   Patient has no known allergies.   Social History   Socioeconomic History   Marital status: Married    Spouse name: Not on file   Number of children: 1   Years of education: Not on file   Highest education level: Not on file  Occupational History   Occupation: Geophysical data processor     Employer: [employer]: Retired  Tobacco Use   Smoking status: Former    Packs/day: 0.30    Years: 3.00    Pack years: 0.90    Types: Cigarettes    Quit date: 06/20/1981    Years  since quitting: 40.1   Smokeless tobacco: Never  Vaping Use   Vaping Use: Never used  Substance and Sexual Activity   Alcohol use: No   Drug use: No   Sexual activity: Not Currently    Birth control/protection: Post-menopausal  Other Topics Concern   Not on file  Social History Narrative   HSG[employer], 2 years at [employer]  Married 1989 - marriage is ok.  Daughter - 1979, 2 step-sons; 2 grand-children. Retired - from [employer], got "out-sourced." She does keep her grandchildren.             Social Determinants of Health   Financial Resource Strain: Not on file  Food Insecurity: Not on file  Transportation Needs: Not on file  Physical Activity: Not on file  Stress: Not on file  Social Connections: Not on file     Family History: The patient's family history includes Colon cancer in her  maternal grandmother; Dementia in her mother; Diabetes in her mother; Esophageal cancer in her father; Heart disease in her mother; Heart failure in her mother; Hypertension in her mother; Lung cancer in her mother. There is no history of Breast cancer or Coronary artery disease.  ROS:   Please see the history of present illness.    The patient denies chest pain, chest pressure, dyspnea at rest, PND, orthopnea, or leg swelling. Denies cough, fever, chills. Denies nausea, vomiting. Denies syncope or presyncope. Denies dizziness or lightheadedness. Denies snoring.   EKGs/Labs/Other Studies Reviewed:    The following studies were reviewed today: Coronary CTA 02/2021: FINDINGS: Coronary calcium score: The patient's coronary artery calcium score is 0, which places the patient in the 0 percentile.   Coronary arteries: Normal coronary origins.  Right dominance.   Right Coronary Artery: Normal caliber vessel, gives rise to PDA. No significant plaque or stenosis.   Left Main Coronary Artery: Normal caliber vessel. No significant plaque or stenosis.   Left Anterior Descending Coronary Artery: Normal caliber vessel. No significant plaque or stenosis. Gives rise to 2 diagonal branches.   Left Circumflex Artery: Normal caliber vessel. No significant plaque or stenosis. Gives rise to 1 large OM branch.   Aorta: Normal size, 30 mm at the mid ascending aorta (level of the PA bifurcation) measured double oblique. No calcifications. No dissection seen in visualized portions of the aorta.   Aortic Valve: No calcifications. Trileaflet.   Other findings:   Normal pulmonary vein drainage into the left atrium.   Normal left atrial appendage without a thrombus.   Normal size of the pulmonary artery.   Normal appearance of the pericardium.   IMPRESSION: 1. No evidence of CAD, CADRADS = 0.   2. Coronary calcium score of 0. This was 0 percentile for age and sex matched control.   3. Normal  coronary origin with right dominance. Myoview 02/2020: There was <70mm ST segment depression noted during stress. The left ventricular ejection fraction is normal (55-65%). Nuclear stress EF: 62%. Defect 1: There is a small defect of mild severity present in the apical anterior location. Findings consistent with ischemia. This is a low risk study.   1. Reversible perfusion defect in apical anterior wall suggestive of small area of ischemia 2. Low risk study given small area of ischemia.  Would recommend referral to cardiology for further evaluation  TTE 04/2020: IMPRESSIONS     1. Left ventricular ejection fraction, by estimation, is 60 to 65%. The  left ventricle has normal function. The left ventricle has  no regional  wall motion abnormalities. Left ventricular diastolic parameters are  consistent with Grade I diastolic  dysfunction (impaired relaxation).   2. Right ventricular systolic function is normal. The right ventricular  size is normal. There is normal pulmonary artery systolic pressure.   3. The mitral valve is normal in structure. Trivial mitral valve  regurgitation. No evidence of mitral stenosis.   4. The aortic valve is tricuspid. Aortic valve regurgitation is not  visualized. No aortic stenosis is present.   5. The inferior vena cava is normal in size with greater than 50%  respiratory variability, suggesting right atrial pressure of 3 mmHg.   TTE 09/01/2011: Study Conclusions   - Procedure narrative: Transthoracic echocardiography. Image    quality was poor.  - Left ventricle: The cavity size was normal. Wall thickness    was normal. Systolic function was normal. The estimated    ejection fraction was in the range of 55% to 60%. Left    ventricular diastolic function parameters were normal.  Transthoracic echocardiography.  M-mode, complete 2D,  spectral Doppler, and color Doppler.  Height:  Height:  167.6cm. Height: 66in.  Weight:  Weight: 88.9kg. Weight:   195.6lb.  Body mass index:  BMI: 31.6kg/m^2.  Body surface  area:    BSA: 1.59m^2.  Blood pressure:     109/67.  Patient  status:  Inpatient.  Location:  Echo laboratory.   ------------------------------------------------------------   ------------------------------------------------------------  Left ventricle:  The cavity size was normal. Wall thickness  was normal. Systolic function was normal. The estimated  ejection fraction was in the range of 55% to 60%. The  transmitral flow pattern was normal. The deceleration time  of the early transmitral flow velocity was normal. The  pulmonary vein flow pattern was normal. The tissue Doppler  parameters were normal. Left ventricular diastolic function  parameters were normal.   ------------------------------------------------------------  Aortic valve:   Structurally normal valve.   Cusp separation  was normal.  Doppler:  Transvalvular velocity was within the  normal range. There was no stenosis.  No regurgitation.   ------------------------------------------------------------  Aorta:  The aorta was normal, not dilated, and non-diseased.     ------------------------------------------------------------  Mitral valve:   Structurally normal valve.   Leaflet  separation was normal.  Doppler:  Transvalvular velocity was  within the normal range. There was no evidence for stenosis.   Trivial regurgitation.   ------------------------------------------------------------  Left atrium:  The atrium was normal in size.   ------------------------------------------------------------  Atrial septum:  Poorly visualized.   ------------------------------------------------------------  Right ventricle:  The cavity size was normal. Wall thickness  was normal. The moderator band was prominent. Systolic  function was normal.   ------------------------------------------------------------  Pulmonic valve:   Poorly visualized.  Doppler:   No   significant regurgitation.   ------------------------------------------------------------  Tricuspid valve:  Poorly visualized.  Structurally normal  valve.   Leaflet separation was normal.  Doppler:  Transvalvular velocity was within the normal range.  No  regurgitation.   ------------------------------------------------------------  Pulmonary artery:   Poorly visualized.   ------------------------------------------------------------  Right atrium:  Poorly visualized.   ------------------------------------------------------------  Pericardium:  There was no pericardial effusion.     Myoview 2013: Findings: Multiplanar SPECT imaging shows no fixed or reversible  perfusion defect within the left ventricular myocardium.  Left  jugular cavity size appears grossly normal.   Images obtained with cardiac gating displayed using a surface  rendering algorithm show no segmental wall motion abnormality.   Left ventricular end-diastolic volume  is calculated at 51 ml.  Left  ventricular end systolic volume is calculated 11 ml.  The derived  left ventricular ejection fraction is probably over estimated at  70%.   IMPRESSION:  No evidence for inducible ischemia in the left ventricle.   Normal left ventricular ejection fraction.    Carotid artery doppler Summary:   - Right ICA appears chronically occluded. No significant    left ICA stenosis or plaque noted in the ICA.  - Findings consistent with .   EKG:  EKG is  ordered today.  The ekg ordered today demonstrates NSR with LAD, early r-wave progression  Recent Labs: 02/04/2021: TSH 2.04 04/21/2021: ALT 10 06/08/2021: BUN 18; Creatinine, Ser 0.90; Hemoglobin 10.1; Magnesium 1.9; Platelets 272.0; Potassium 3.9; Sodium 138  Recent Lipid Panel    Component Value Date/Time   CHOL 131 04/21/2021 0000   TRIG 74 04/21/2021 0000   TRIG 41 05/05/2006 1206   HDL 39 (L) 04/21/2021 0000   CHOLHDL 3.4 04/21/2021 0000   CHOLHDL 3.4  03/02/2020 1458   VLDL 19.6 01/29/2019 1504   LDLCALC 77 04/21/2021 0000   LDLCALC 83 03/02/2020 1458      Physical Exam:    VS:  There were no vitals taken for this visit.    Wt Readings from Last 3 Encounters:  06/08/21 186 lb (84.4 kg)  02/15/21 178 lb 6.4 oz (80.9 kg)  02/04/21 179 lb 12.8 oz (81.6 kg)     GEN:  Well nourished, well developed in no acute distress HEENT: Normal NECK: No JVD; right carotid bruit. Clear on left LYMPHATICS: No lymphadenopathy CARDIAC: RRR, no murmurs, rubs, gallops RESPIRATORY:  Clear to auscultation without rales, wheezing or rhonchi  ABDOMEN: Soft, non-tender, non-distended MUSCULOSKELETAL:  No edema; No deformity  SKIN: Warm and dry NEUROLOGIC:  Alert and oriented x 3 PSYCHIATRIC:  Normal affect   ASSESSMENT:    No diagnosis found.  PLAN:    In order of problems listed above:  #Dyspnea on Exertion: #Positive Stress Test: Myoview 02/2020 with mild defect in apical anterior location. Follow-up Coronary CTA with Ca score 0. No significant blockages. TTE 04/2020 with LVEF 60-65%, G1DD, no significant valve disease.  -Continue ASA 325mg  (on higher dose due to carotid artery occlusion) -Continue lipitor 40mg  daily -Continue metop 25mg  XL -Continue losartan 100mg  daily  #Carotid artery disease: Carotid ultrasound 2013 with occluded right carotid artery. -Continue ASA 325mg  as prescribed by Neurology -Continue atorvastatin to 40mg  as above  #Hypertension: Needs aggressive blood pressure control with goal BP <120s/80s. -Continue losartan 100mg  as above -Continue metoprolol 25mg  as above -Continue amlodipine 5mg  daily -Monitor blood pressures over next 5 days and send me the log; will adjust medications as needed  #HLD: -Continue atorvastatin 40mg  daily; goal LDL <70  #DMII: Managed by PCP. -Continue Synjardy  Medication Adjustments/Labs and Tests Ordered: Current medicines are reviewed at length with the patient today.   Concerns regarding medicines are outlined above.  No orders of the defined types were placed in this encounter.  No orders of the defined types were placed in this encounter.   There are no Patient Instructions on file for this visit.   Signed, Jaime Bergeron, MD  07/17/2021 8:58 PM    [place]

## 2021-07-20 ENCOUNTER — Encounter: Payer: Self-pay | Admitting: Cardiology

## 2021-07-20 ENCOUNTER — Other Ambulatory Visit: Payer: Self-pay

## 2021-07-20 ENCOUNTER — Ambulatory Visit (INDEPENDENT_AMBULATORY_CARE_PROVIDER_SITE_OTHER): Payer: 59 | Admitting: Cardiology

## 2021-07-20 VITALS — BP 132/80 | HR 77 | Ht 66.0 in | Wt 182.8 lb

## 2021-07-20 DIAGNOSIS — I10 Essential (primary) hypertension: Secondary | ICD-10-CM

## 2021-07-20 DIAGNOSIS — E785 Hyperlipidemia, unspecified: Secondary | ICD-10-CM

## 2021-07-20 DIAGNOSIS — R0609 Other forms of dyspnea: Secondary | ICD-10-CM

## 2021-07-20 DIAGNOSIS — E118 Type 2 diabetes mellitus with unspecified complications: Secondary | ICD-10-CM

## 2021-07-20 DIAGNOSIS — I6521 Occlusion and stenosis of right carotid artery: Secondary | ICD-10-CM

## 2021-07-20 DIAGNOSIS — R5383 Other fatigue: Secondary | ICD-10-CM

## 2021-07-20 NOTE — Progress Notes (Signed)
Cardiology Office Note:    Date:  07/20/2021   ID:  Jaime [name], DOB 26-May-1956, MRN [id number]  PCP:  Janith Lima, MD  [facility] Cardiologist:  Freada Bergeron, MD  [facility] Electrophysiologist:  None   Referring MD: Janith Lima, MD   History of Present Illness:    Jaime [name] is a 66 y.o. 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  She had stress test September 2021 ordered by her primary care provider for dyspnea on exertion showing reversible perfusion defect in apical anterior wall suggestive of small area of ischemia which was overall low risk study.  She was seen in clinic with worsening SOB on exertion. TTE 04/2020 with EF 60-65%, G1DD, normal RV, trivial MR.   She was admitted 3/31 - 09/21/2020 with perforated acute appendicitis.  She was treated with IV antibiotics and surgery deferred.  She was admitted 11/02/2020 for appendectomy.   Last saw [name] on 02/15/2021 where she was having persistent DOE. Underwent We coronary CTA 02/2021 which showed Ca score 0 and no significant blockages.  Today, she is doing well. We discussed the result of her Coronary CTA. At home, she reports her blood pressure ranges from 267 to 124 systolic. She is compliant with and tolerating her medications.   She feels fatigued throughout the day and with activity. She is wondering why this has persisted and what is the underlying cause given reassuring cardiac work-up. Of note, she is anemic with a Hgb of 10.1. She is not currently on iron supplements but does take vitamin D. She denies blood in the stool or urine and melena.   The patient denies chest pain, chest pressure, palpitations, PND, orthopnea, or leg swelling. Denies cough, fever, chills. Denies nausea, vomiting. Denies syncope or presyncope. Denies dizziness or lightheadedness. Denies  snoring.  Past Medical History:  Diagnosis Date   Anemia    Dyspnea    with exertion    Endometriosis    GERD (gastroesophageal reflux disease)    Hypertension    Meningioma (HCC)    Nontoxic multinodular goiter    Occlusion and stenosis of carotid artery without mention of cerebral infarction    Peptic ulcer disease    PVD (peripheral vascular disease) ([place])     Past Surgical History:  Procedure Laterality Date   CT RADIATION THERAPY GUIDE     CT done yearly for menigioma with radiation if needed   CYST EXCISION     head   CYST EXCISION Left    x 2    cyst removed     left wrist   endometriosis surgery      LAPAROSCOPIC APPENDECTOMY N/A 11/02/2020   Procedure: APPENDECTOMY LAPAROSCOPIC;  Surgeon: Clovis Riley, MD;  Location: [facility]S;  Service: General;  Laterality: N/A;   ORIF ANKLE FRACTURE     right   OVARIAN CYST REMOVAL     right   SHOULDER SURGERY Right    Spur removal   [place]  2012   right thumb   TUBAL LIGATION     VIDEO BRONCHOSCOPY  11/10/2011   Procedure: VIDEO BRONCHOSCOPY WITH FLUORO;  Surgeon: Tanda Rockers, MD;  Location: [facility] ENDOS[facility];  Service: Cardiopulmonary;  Laterality: Bilateral;    Current Medications: Current Meds  Medication Sig   amLODipine (NORVASC)  5 MG tablet Take 1 tablet (5 mg total) by mouth daily.   aspirin 325 MG tablet Take 325 mg by mouth daily.    atorvastatin (LIPITOR) 80 MG tablet Take 1 tablet (80 mg total) by mouth daily.   Cholecalciferol (VITAMIN D) 125 MCG (5000 UT) CAPS Take by mouth.   Empagliflozin-metFORMIN HCl (SYNJARDY) 5-500 MG TABS Take 1 tablet by mouth 2 (two) times daily.   ferrous sulfate 325 (65 FE) MG tablet Take 325 mg by mouth daily with breakfast.   losartan (COZAAR) 100 MG tablet Take 1 tablet (100 mg total) by mouth daily.   Multiple Vitamins-Minerals (MULTIVITAMINS THER. W/MINERALS) TABS Take 1 tablet by mouth daily as needed (nutrition).   pantoprazole  (PROTONIX) 40 MG tablet TAKE 1 TABLET BY MOUTH EVERY DAY IN THE MORNING     Allergies:   Patient has no known allergies.   Social History   Socioeconomic History   Marital status: Married    Spouse name: Not on file   Number of children: 1   Years of education: Not on file   Highest education level: Not on file  Occupational History   Occupation: Geophysical data processor     Employer: [employer]: Retired  Tobacco Use   Smoking status: Former    Packs/day: 0.30    Years: 3.00    Pack years: 0.90    Types: Cigarettes    Quit date: 06/20/1981    Years since quitting: 40.1   Smokeless tobacco: Never  Vaping Use   Vaping Use: Never used  Substance and Sexual Activity   Alcohol use: No   Drug use: No   Sexual activity: Not Currently    Birth control/protection: Post-menopausal  Other Topics Concern   Not on file  Social History Narrative   HSG-Pge, 2 years at [employer]  Married 1989 - marriage is ok.  Daughter - 1979, 2 step-sons; 2 grand-children. Retired - from [employer] entry, got "out-sourced." She does keep her grandchildren.             Social Determinants of Health   Financial Resource Strain: Not on file  Food Insecurity: Not on file  Transportation Needs: Not on file  Physical Activity: Not on file  Stress: Not on file  Social Connections: Not on file     Family History: The patient's family history includes Colon cancer in her maternal grandmother; Dementia in her mother; Diabetes in her mother; Esophageal cancer in her father; Heart disease in her mother; Heart failure in her mother; Hypertension in her mother; Lung cancer in her mother. There is no history of Breast cancer or Coronary artery disease.  ROS:   Please see the history of present illness.    Review of Systems  Constitutional:  Positive for malaise/fatigue. Negative for weight loss.  HENT:  Negative for congestion and sinus pain.   Eyes:  Negative for blurred vision.   Respiratory:  Positive for shortness of breath. Negative for cough.   Cardiovascular:  Negative for chest pain, palpitations, orthopnea, claudication, leg swelling and PND.  Gastrointestinal:  Negative for blood in stool and melena.  Genitourinary:  Negative for dysuria.  Musculoskeletal:  Negative for joint pain and myalgias.  Skin:  Negative for itching and rash.  Neurological:  Positive for sensory change (Hot Flashes). Negative for dizziness and headaches.  Endo/Heme/Allergies:  Does not bruise/bleed easily.  Psychiatric/Behavioral:  The patient is not nervous/anxious and does not have insomnia.  EKGs/Labs/Other Studies Reviewed:    The following studies were reviewed today: Coronary CTA 02/2021: FINDINGS: Coronary calcium score: The patient's coronary artery calcium score is 0, which places the patient in the 0 percentile.   Coronary arteries: Normal coronary origins.  Right dominance.   Right Coronary Artery: Normal caliber vessel, gives rise to PDA. No significant plaque or stenosis.   Left Main Coronary Artery: Normal caliber vessel. No significant plaque or stenosis.   Left Anterior Descending Coronary Artery: Normal caliber vessel. No significant plaque or stenosis. Gives rise to 2 diagonal branches.   Left Circumflex Artery: Normal caliber vessel. No significant plaque or stenosis. Gives rise to 1 large OM branch.   Aorta: Normal size, 30 mm at the mid ascending aorta (level of the PA bifurcation) measured double oblique. No calcifications. No dissection seen in visualized portions of the aorta.   Aortic Valve: No calcifications. Trileaflet.   Other findings:   Normal pulmonary vein drainage into the left atrium.   Normal left atrial appendage without a thrombus.   Normal size of the pulmonary artery.   Normal appearance of the pericardium.   IMPRESSION: 1. No evidence of CAD, CADRADS = 0.   2. Coronary calcium score of 0. This was 0 percentile  for age and sex matched control.   3. Normal coronary origin with right dominance. Myoview 02/2020: There was <39mm ST segment depression noted during stress. The left ventricular ejection fraction is normal (55-65%). Nuclear stress EF: 62%. Defect 1: There is a small defect of mild severity present in the apical anterior location. Findings consistent with ischemia. This is a low risk study.   1. Reversible perfusion defect in apical anterior wall suggestive of small area of ischemia 2. Low risk study given small area of ischemia.  Would recommend referral to cardiology for further evaluation  TTE 04/2020: IMPRESSIONS     1. Left ventricular ejection fraction, by estimation, is 60 to 65%. The  left ventricle has normal function. The left ventricle has no regional  wall motion abnormalities. Left ventricular diastolic parameters are  consistent with Grade I diastolic dysfunction (impaired relaxation).   2. Right ventricular systolic function is normal. The right ventricular  size is normal. There is normal pulmonary artery systolic pressure.   3. The mitral valve is normal in structure. Trivial mitral valve  regurgitation. No evidence of mitral stenosis.   4. The aortic valve is tricuspid. Aortic valve regurgitation is not  visualized. No aortic stenosis is present.   5. The inferior vena cava is normal in size with greater than 50%  respiratory variability, suggesting right atrial pressure of 3 mmHg.   TTE 09/01/2011: Study Conclusions   - Procedure narrative: Transthoracic echocardiography. Image    quality was poor.  - Left ventricle: The cavity size was normal. Wall thickness    was normal. Systolic function was normal. The estimated    ejection fraction was in the range of 55% to 60%. Left    ventricular diastolic function parameters were normal.  Transthoracic echocardiography.  M-mode, complete 2D,  spectral Doppler, and color Doppler.  Height:  Height:  167.6cm.  Height: 66in.  Weight:  Weight: 88.9kg. Weight:  195.6lb.  Body mass index:  BMI: 31.6kg/m^2.  Body surface  area:    BSA: 1.28m^2.  Blood pressure:     109/67.  Patient  status:  Inpatient.  Location:  Echo laboratory.   ------------------------------------------------------------   ------------------------------------------------------------  Left ventricle:  The cavity size was normal. Wall  thickness  was normal. Systolic function was normal. The estimated  ejection fraction was in the range of 55% to 60%. The  transmitral flow pattern was normal. The deceleration time  of the early transmitral flow velocity was normal. The  pulmonary vein flow pattern was normal. The tissue Doppler  parameters were normal. Left ventricular diastolic function  parameters were normal.   ------------------------------------------------------------  Aortic valve:   Structurally normal valve.   Cusp separation  was normal.  Doppler:  Transvalvular velocity was within the  normal range. There was no stenosis.  No regurgitation.   ------------------------------------------------------------  Aorta:  The aorta was normal, not dilated, and non-diseased.     ------------------------------------------------------------  Mitral valve:   Structurally normal valve.   Leaflet  separation was normal.  Doppler:  Transvalvular velocity was  within the normal range. There was no evidence for stenosis.   Trivial regurgitation.   ------------------------------------------------------------  Left atrium:  The atrium was normal in size.   ------------------------------------------------------------  Atrial septum:  Poorly visualized.   ------------------------------------------------------------  Right ventricle:  The cavity size was normal. Wall thickness  was normal. The moderator band was prominent. Systolic  function was normal.   ------------------------------------------------------------  Pulmonic  valve:   Poorly visualized.  Doppler:   No  significant regurgitation.   ------------------------------------------------------------  Tricuspid valve:  Poorly visualized.  Structurally normal  valve.   Leaflet separation was normal.  Doppler:  Transvalvular velocity was within the normal range.  No  regurgitation.   ------------------------------------------------------------  Pulmonary artery:   Poorly visualized.   ------------------------------------------------------------  Right atrium:  Poorly visualized.   ------------------------------------------------------------  Pericardium:  There was no pericardial effusion.     Myoview 2013: Findings: Multiplanar SPECT imaging shows no fixed or reversible  perfusion defect within the left ventricular myocardium.  Left  jugular cavity size appears grossly normal.   Images obtained with cardiac gating displayed using a surface  rendering algorithm show no segmental wall motion abnormality.   Left ventricular end-diastolic volume is calculated at 51 ml.  Left  ventricular end systolic volume is calculated 11 ml.  The derived  left ventricular ejection fraction is probably over estimated at  70%.   IMPRESSION:  No evidence for inducible ischemia in the left ventricle.   Normal left ventricular ejection fraction.    Carotid artery doppler Summary:   - Right ICA appears chronically occluded. No significant    left ICA stenosis or plaque noted in the ICA.  - Findings consistent with .   EKG:  EKG was not ordered today 04/02/20: NSR with LAD, early r-wave progression  Recent Labs: 02/04/2021: TSH 2.04 04/21/2021: ALT 10 06/08/2021: BUN 18; Creatinine, Ser 0.90; Hemoglobin 10.1; Magnesium 1.9; Platelets 272.0; Potassium 3.9; Sodium 138  Recent Lipid Panel    Component Value Date/Time   CHOL 131 04/21/2021 0000   TRIG 74 04/21/2021 0000   TRIG 41 05/05/2006 1206   HDL 39 (L) 04/21/2021 0000   CHOLHDL 3.4 04/21/2021 0000    CHOLHDL 3.4 03/02/2020 1458   VLDL 19.6 01/29/2019 1504   LDLCALC 77 04/21/2021 0000   LDLCALC 83 03/02/2020 1458      Physical Exam:    VS:  BP 132/80    Pulse 77    Ht 5\' 6"  (1.676 m)    Wt 182 lb 12.8 oz (82.9 kg)    SpO2 97%    BMI 29.50 kg/m     Wt Readings from Last 3 Encounters:  07/20/21 182 lb 12.8  oz (82.9 kg)  06/08/21 186 lb (84.4 kg)  02/15/21 178 lb 6.4 oz (80.9 kg)     GEN:  Well nourished, well developed in no acute distress HEENT: Normal NECK: No JVD; no carotid bruits CARDIAC: RRR, no murmurs, rubs, gallops RESPIRATORY:  Clear to auscultation without rales, wheezing or rhonchi  ABDOMEN: Soft, non-tender, non-distended MUSCULOSKELETAL:  No edema; No deformity  SKIN: Warm and dry NEUROLOGIC:  Alert and oriented x 3 PSYCHIATRIC:  Normal affect   ASSESSMENT:    1. Dyspnea on exertion   2. Hyperlipidemia LDL goal <70   3. Essential hypertension   4. Type II diabetes mellitus with manifestations ([place])   5. Occlusion of right carotid artery   6. Other fatigue     PLAN:    In order of problems listed above:  #Dyspnea on Exertion: #Positive Stress Test: Myoview 02/2020 with mild defect in apical anterior location. Follow-up Coronary CTA with no significant disease and Ca score 0. TTE 04/2020 with LVEF 60-65%, G1DD, no significant valve disease.  -Continue ASA 325mg  (on higher dose due to carotid artery occlusion) -Continue lipitor 40mg  daily -Continue metop 25mg  XL -Continue losartan 100mg  daily  #Persistent Fatigue: Unclear etiology given reassuring cardiac work-up. TSH normal. Possibly anemia is contributing. Will start iron supplementation and can consider sleep test in future if persists. -Start iron supplementation -Repeat CBC per PCP at follow-up -Consider sleep study if symptoms persist  #Carotid artery disease: Carotid ultrasound 2013 with occluded right carotid artery. -Continue ASA 325mg  as prescribed by Neurology -Continue  atorvastatin to 40mg  as above  #Hypertension: Well controlled and at goal currently -Continue losartan 100mg  as above -Continue metoprolol 25mg  as above -Continue amlodipine 5mg  daily  #HLD: -Continue atorvastatin 40mg  daily; goal LDL <70  #DMII: Managed by PCP. -Continue Synjardy  Medication Adjustments/Labs and Tests Ordered: Current medicines are reviewed at length with the patient today.  Concerns regarding medicines are outlined above.  No orders of the defined types were placed in this encounter.  No orders of the defined types were placed in this encounter.   Patient Instructions  Medication Instructions:   START TAKING OVER-THE-COUNTER IRON SUPPLEMENTS DAILY  *If you need a refill on your cardiac medications before your next appointment, please call your pharmacy*   Follow-Up: At [facility], you and your health needs are our priority.  As part of our continuing mission to provide you with exceptional heart care, we have created designated Provider Care Teams.  These Care Teams include your primary Cardiologist (physician) and Advanced Practice Providers (APPs -  Physician Assistants and Nurse Practitioners) who all work together to provide you with the care you need, when you need it.  We recommend signing up for the patient portal called "MyChart".  Sign up information is provided on this After Visit Summary.  MyChart is used to connect with patients for Virtual Visits (Telemedicine).  Patients are able to view lab/test results, encounter notes, upcoming appointments, etc.  Non-urgent messages can be sent to your provider as well.   To learn more about what you can do with MyChart, go to [email or web address].    Your next appointment:   6 month(s)  The format for your next appointment:   In Person  Provider:   Freada Bergeron, MD     Wilhemina Bonito as a scribe for Freada Bergeron, MD.,have documented all relevant documentation on the  behalf of Freada Bergeron, MD,as directed by  Freada Bergeron, MD while in the  presence of Freada Bergeron, MD.  I, Freada Bergeron, MD, have reviewed all documentation for this visit. The documentation on 07/20/21 for the exam, diagnosis, procedures, and orders are all accurate and complete.   Signed, Freada Bergeron, MD  07/20/2021 5:03 PM    [place]

## 2021-07-20 NOTE — Patient Instructions (Signed)
Medication Instructions:   START TAKING OVER-THE-COUNTER IRON SUPPLEMENTS DAILY  *If you need a refill on your cardiac medications before your next appointment, please call your pharmacy*   Follow-Up: At [facility], you and your health needs are our priority.  As part of our continuing mission to provide you with exceptional heart care, we have created designated Provider Care Teams.  These Care Teams include your primary Cardiologist (physician) and Advanced Practice Providers (APPs -  Physician Assistants and Nurse Practitioners) who all work together to provide you with the care you need, when you need it.  We recommend signing up for the patient portal called "MyChart".  Sign up information is provided on this After Visit Summary.  MyChart is used to connect with patients for Virtual Visits (Telemedicine).  Patients are able to view lab/test results, encounter notes, upcoming appointments, etc.  Non-urgent messages can be sent to your provider as well.   To learn more about what you can do with MyChart, go to [email or web address].    Your next appointment:   6 month(s)  The format for your next appointment:   In Person  Provider:   Freada Bergeron, MD

## 2021-08-27 ENCOUNTER — Other Ambulatory Visit: Payer: Self-pay | Admitting: Internal Medicine

## 2021-08-27 DIAGNOSIS — R911 Solitary pulmonary nodule: Secondary | ICD-10-CM

## 2021-09-16 ENCOUNTER — Other Ambulatory Visit: Payer: Self-pay | Admitting: Internal Medicine

## 2021-09-16 DIAGNOSIS — Z1231 Encounter for screening mammogram for malignant neoplasm of breast: Secondary | ICD-10-CM

## 2021-09-21 ENCOUNTER — Ambulatory Visit
Admission: RE | Admit: 2021-09-21 | Discharge: 2021-09-21 | Disposition: A | Discharge status: Home / Self Care | Payer: Medicare Other | Source: Ambulatory Visit | Attending: Internal Medicine | Admitting: Internal Medicine

## 2021-09-21 DIAGNOSIS — Z1231 Encounter for screening mammogram for malignant neoplasm of breast: Secondary | ICD-10-CM

## 2021-09-23 ENCOUNTER — Other Ambulatory Visit: Payer: Self-pay | Admitting: Internal Medicine

## 2021-09-23 DIAGNOSIS — R928 Other abnormal and inconclusive findings on diagnostic imaging of breast: Secondary | ICD-10-CM

## 2021-10-06 ENCOUNTER — Ambulatory Visit: Payer: Medicare Other

## 2021-10-06 ENCOUNTER — Ambulatory Visit
Admission: RE | Admit: 2021-10-06 | Discharge: 2021-10-06 | Disposition: A | Discharge status: Home / Self Care | Payer: Medicare Other | Source: Ambulatory Visit | Attending: Internal Medicine | Admitting: Internal Medicine

## 2021-10-06 DIAGNOSIS — R928 Other abnormal and inconclusive findings on diagnostic imaging of breast: Secondary | ICD-10-CM

## 2021-11-02 ENCOUNTER — Ambulatory Visit (INDEPENDENT_AMBULATORY_CARE_PROVIDER_SITE_OTHER)
Admission: RE | Admit: 2021-11-02 | Discharge: 2021-11-02 | Disposition: A | Discharge status: Home / Self Care | Payer: Medicare Other | Source: Ambulatory Visit | Attending: Internal Medicine | Admitting: Internal Medicine

## 2021-11-02 DIAGNOSIS — R911 Solitary pulmonary nodule: Secondary | ICD-10-CM

## 2021-11-09 LAB — HM DIABETES EYE EXAM

## 2021-11-10 ENCOUNTER — Ambulatory Visit (INDEPENDENT_AMBULATORY_CARE_PROVIDER_SITE_OTHER): Payer: Medicare Other | Admitting: Internal Medicine

## 2021-11-10 ENCOUNTER — Encounter: Payer: Self-pay | Admitting: Internal Medicine

## 2021-11-10 VITALS — BP 136/88 | HR 72 | Temp 98.1°F | Ht 66.0 in | Wt 172.0 lb

## 2021-11-10 DIAGNOSIS — I10 Essential (primary) hypertension: Secondary | ICD-10-CM

## 2021-11-10 DIAGNOSIS — I5189 Other ill-defined heart diseases: Secondary | ICD-10-CM

## 2021-11-10 DIAGNOSIS — E118 Type 2 diabetes mellitus with unspecified complications: Secondary | ICD-10-CM

## 2021-11-10 DIAGNOSIS — D649 Anemia, unspecified: Secondary | ICD-10-CM | POA: Diagnosis not present

## 2021-11-10 LAB — CBC WITH DIFFERENTIAL/PLATELET
Basophils Absolute: 0.1 10*3/uL (ref 0.0–0.1)
Basophils Relative: 0.8 % (ref 0.0–3.0)
Eosinophils Absolute: 0.4 10*3/uL (ref 0.0–0.7)
Eosinophils Relative: 4.2 % (ref 0.0–5.0)
HCT: 33.3 % — ABNORMAL LOW (ref 36.0–46.0)
Hemoglobin: 10.8 g/dL — ABNORMAL LOW (ref 12.0–15.0)
Lymphocytes Relative: 27.7 % (ref 12.0–46.0)
Lymphs Abs: 2.4 10*3/uL (ref 0.7–4.0)
MCHC: 32.5 g/dL (ref 30.0–36.0)
MCV: 82 fl (ref 78.0–100.0)
Monocytes Absolute: 0.5 10*3/uL (ref 0.1–1.0)
Monocytes Relative: 6.2 % (ref 3.0–12.0)
Neutro Abs: 5.2 10*3/uL (ref 1.4–7.7)
Neutrophils Relative %: 61.1 % (ref 43.0–77.0)
Platelets: 254 10*3/uL (ref 150.0–400.0)
RBC: 4.06 Mil/uL (ref 3.87–5.11)
RDW: 17.7 % — ABNORMAL HIGH (ref 11.5–15.5)
WBC: 8.5 10*3/uL (ref 4.0–10.5)

## 2021-11-10 LAB — IBC + FERRITIN
Ferritin: 13.8 ng/mL (ref 10.0–291.0)
Iron: 74 ug/dL (ref 42–145)
Saturation Ratios: 23 % (ref 20.0–50.0)
TIBC: 322 ug/dL (ref 250.0–450.0)
Transferrin: 230 mg/dL (ref 212.0–360.0)

## 2021-11-10 LAB — BASIC METABOLIC PANEL
BUN: 8 mg/dL (ref 6–23)
CO2: 29 mEq/L (ref 19–32)
Calcium: 9.3 mg/dL (ref 8.4–10.5)
Chloride: 104 mEq/L (ref 96–112)
Creatinine, Ser: 0.76 mg/dL (ref 0.40–1.20)
GFR: 82.35 mL/min (ref >60.00)
Glucose, Bld: 95 mg/dL (ref 70–99)
Potassium: 3.6 mEq/L (ref 3.5–5.1)
Sodium: 141 mEq/L (ref 135–145)

## 2021-11-10 LAB — HEMOGLOBIN A1C: Hgb A1c MFr Bld: 6.5 % (ref 4.6–6.5)

## 2021-11-10 MED ORDER — SYNJARDY 5-500 MG PO TABS: 1.0000 | ORAL_TABLET | Freq: Two times a day (BID) | ORAL | 1 refills | Status: DC

## 2021-11-10 NOTE — Progress Notes (Signed)
Subjective:  Patient ID: Jaime [name], female    DOB: 1956/04/12  Age: 66 y.o. MRN: [id number]  CC: Diabetes, Anemia, and Hypertension   HPI Jaime [name] presents for f/up -  She complains of chronic fatigue.  She is active and denies chest pain, shortness of breath, diaphoresis, edema, dizziness, or lightheadedness.  Outpatient Medications Prior to Visit  Medication Sig Dispense Refill   amLODipine (NORVASC) 5 MG tablet Take 1 tablet (5 mg total) by mouth daily. 90 tablet 1   aspirin 325 MG tablet Take 325 mg by mouth daily.      atorvastatin (LIPITOR) 80 MG tablet Take 1 tablet (80 mg total) by mouth daily. 90 tablet 3   Cholecalciferol (VITAMIN D) 125 MCG (5000 UT) CAPS Take by mouth.     ferrous sulfate 325 (65 FE) MG tablet Take 325 mg by mouth daily with breakfast.     gabapentin (NEURONTIN) 100 MG capsule Take 100 mg by mouth 3 (three) times daily.     losartan (COZAAR) 100 MG tablet Take 1 tablet (100 mg total) by mouth daily. 90 tablet 2   metoprolol succinate (TOPROL XL) 25 MG 24 hr tablet Take 1 tablet (25 mg total) by mouth daily after supper. 90 tablet 0   Multiple Vitamins-Minerals (MULTIVITAMINS THER. W/MINERALS) TABS Take 1 tablet by mouth daily as needed (nutrition).     pantoprazole (PROTONIX) 40 MG tablet TAKE 1 TABLET BY MOUTH EVERY DAY IN THE MORNING 30 tablet 5   Empagliflozin-metFORMIN HCl (SYNJARDY) 5-500 MG TABS Take 1 tablet by mouth 2 (two) times daily. 180 tablet 1   No facility-administered medications prior to visit.    ROS Review of Systems  Constitutional:  Positive for fatigue. Negative for appetite change, chills, diaphoresis and unexpected weight change.  HENT: Negative.    Eyes: Negative.   Respiratory:  Negative for cough, chest tightness, shortness of breath and wheezing.   Cardiovascular:  Negative for chest pain, palpitations and leg swelling.  Gastrointestinal:  Negative for abdominal pain, constipation, diarrhea, nausea and  vomiting.  Endocrine: Negative.   Genitourinary: Negative.  Negative for difficulty urinating.  Musculoskeletal: Negative.   Skin: Negative.   Neurological:  Negative for dizziness, weakness and light-headedness.  Hematological:  Negative for adenopathy. Does not bruise/bleed easily.  Psychiatric/Behavioral: Negative.     Objective:  BP 136/88 (BP Location: Left Arm, Patient Position: Sitting, Cuff Size: Large)   Pulse 72   Temp 98.1 F (36.7 C) (Oral)   Ht '5\' 6"'$  (1.676 m)   Wt 172 lb (78 kg)   SpO2 97%   BMI 27.76 kg/m   BP Readings from Last 3 Encounters:  11/10/21 136/88  07/20/21 132/80  06/08/21 132/86    Wt Readings from Last 3 Encounters:  11/10/21 172 lb (78 kg)  07/20/21 182 lb 12.8 oz (82.9 kg)  06/08/21 186 lb (84.4 kg)    Physical Exam Vitals reviewed.  HENT:     Nose: Nose normal.     Mouth/Throat:     Mouth: Mucous membranes are moist.  Eyes:     General: No scleral icterus.    Conjunctiva/sclera: Conjunctivae normal.  Cardiovascular:     Rate and Rhythm: Normal rate and regular rhythm.     Heart sounds: No murmur heard.   No gallop.  Pulmonary:     Effort: Pulmonary effort is normal. No respiratory distress.     Breath sounds: No stridor. No wheezing, rhonchi or rales.  Chest:  Chest wall: No tenderness.  Abdominal:     General: Abdomen is flat.     Palpations: There is no mass.     Tenderness: There is no abdominal tenderness. There is no guarding.     Hernia: No hernia is present.  Musculoskeletal:        General: Normal range of motion.     Cervical back: Neck supple.     Right lower leg: No edema.     Left lower leg: No edema.  Lymphadenopathy:     Cervical: No cervical adenopathy.  Skin:    General: Skin is warm and dry.  Neurological:     General: No focal deficit present.     Mental Status: She is alert.  Psychiatric:        Mood and Affect: Mood normal.        Behavior: Behavior normal.    Lab Results  Component Value  Date   WBC 8.5 11/10/2021   HGB 10.8 (L) 11/10/2021   HCT 33.3 (L) 11/10/2021   PLT 254.0 11/10/2021   GLUCOSE 95 11/10/2021   CHOL 131 04/21/2021   TRIG 74 04/21/2021   HDL 39 (L) 04/21/2021   LDLCALC 77 04/21/2021   ALT 10 04/21/2021   AST 18 04/21/2021   NA 141 11/10/2021   K 3.6 11/10/2021   CL 104 11/10/2021   CREATININE 0.76 11/10/2021   BUN 8 11/10/2021   CO2 29 11/10/2021   TSH 2.04 02/04/2021   HGBA1C 6.5 11/10/2021   MICROALBUR 0.8 06/08/2021    CT Chest Wo Contrast  Result Date: 11/03/2021 CLINICAL DATA:  Lung nodule follow-up, right lower lobe. EXAM: CT CHEST WITHOUT CONTRAST TECHNIQUE: Multidetector CT imaging of the chest was performed following the standard protocol without IV contrast. RADIATION DOSE REDUCTION: This exam was performed according to the departmental dose-optimization program which includes automated exposure control, adjustment of the mA and/or kV according to patient size and/or use of iterative reconstruction technique. COMPARISON:  CT chest abdomen and pelvis 09/17/2020. FINDINGS: Cardiovascular: No significant vascular findings. Normal heart size. No pericardial effusion. Mediastinum/Nodes: There are calcified mediastinal and hilar lymph nodes. No discrete enlarged lymph nodes are identified allowing for lack of intravenous contrast. Esophagus and visualized thyroid gland are within normal limits. Lungs/Pleura: There is some scattered calcified nodules bilaterally, unchanged from the prior examination. There is a stable 4 mm fissural nodule in the right lower lobe image 3/74. There is some scattered micro nodules in both lungs measuring 3 mm or less, unchanged. There is a single new 2 mm nodular density in the left lower lobe image 3/90. The lungs are otherwise clear. No pleural effusion or pneumothorax. Upper Abdomen: No acute abnormality. Musculoskeletal: T7 vertebral body hemangioma again noted. No acute fractures. IMPRESSION: 1. Unchanged nodular  densities measuring up to 4 mm. No follow-up needed if patient is low-risk (and has no known or suspected primary neoplasm). Non-contrast chest CT can be considered in 12 months if patient is high-risk. This recommendation follows the consensus statement: Guidelines for Management of Incidental Pulmonary Nodules Detected on CT Images: From the Fleischner Society 2017; Radiology 2017; 284:228-243. 2.  Old granulomatous disease. Electronically Signed   By: Ronney Asters M.D.   On: 11/03/2021 18:34    Assessment & Plan:   Kaelynn was seen today for diabetes, anemia and hypertension.  Diagnoses and all orders for this visit:  Essential hypertension- Her blood pressure is well controlled. -     Basic metabolic panel; Future -  CBC with Differential/Platelet; Future -     CBC with Differential/Platelet -     Basic metabolic panel  Type II diabetes mellitus with manifestations ([place])- Her blood sugar is well controlled. -     Basic metabolic panel; Future -     Hemoglobin A1c; Future -     HM Diabetes Foot Exam -     Empagliflozin-metFORMIN HCl (SYNJARDY) 5-500 MG TABS; Take 1 tablet by mouth 2 (two) times daily. -     Hemoglobin A1c -     Basic metabolic panel  Normocytic anemia- Her H&H have improved. -     CBC with Differential/Platelet; Future -     IBC + Ferritin; Future -     IBC + Ferritin -     CBC with Differential/Platelet  Diastolic dysfunction without heart failure- She has a normal volume status. -     Empagliflozin-metFORMIN HCl (SYNJARDY) 5-500 MG TABS; Take 1 tablet by mouth 2 (two) times daily.   I am having Zenia Resides. Monnin maintain her multivitamins ther. w/minerals, aspirin, atorvastatin, losartan, pantoprazole, amLODipine, metoprolol succinate, Vitamin D, ferrous sulfate, gabapentin, and Synjardy.  Meds ordered this encounter  Medications   Empagliflozin-metFORMIN HCl (SYNJARDY) 5-500 MG TABS    Sig: Take 1 tablet by mouth 2 (two) times daily.    Dispense:  180  tablet    Refill:  1     Follow-up: Return in about 6 months (around 05/13/2022).  Scarlette Calico, MD

## 2021-11-10 NOTE — Patient Instructions (Signed)

## 2021-11-15 ENCOUNTER — Encounter: Payer: Self-pay | Admitting: Internal Medicine

## 2021-12-20 ENCOUNTER — Other Ambulatory Visit: Payer: Self-pay | Admitting: Internal Medicine

## 2021-12-20 ENCOUNTER — Other Ambulatory Visit: Payer: Self-pay

## 2021-12-20 DIAGNOSIS — K219 Gastro-esophageal reflux disease without esophagitis: Secondary | ICD-10-CM

## 2021-12-20 MED ORDER — METOPROLOL SUCCINATE ER 25 MG PO TB24: 25.0000 mg | ORAL_TABLET | Freq: Every day | ORAL | 1 refills | Status: DC

## 2022-01-14 NOTE — Progress Notes (Unsigned)
Cardiology Office Note:    Date:  01/17/2022   ID:  Jaime [name], DOB [birth year], MRN [id number]  PCP:  Janith Lima, MD  [facility] Cardiologist:  Freada Bergeron, MD  [facility] Electrophysiologist:  None   Referring MD: Janith Lima, MD   History of Present Illness:    Jaime [name] is a 66 y.o. 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  She had stress test September 2021 ordered by her primary care provider for dyspnea on exertion showing reversible perfusion defect in apical anterior wall suggestive of small area of ischemia which was overall low risk study.  She was seen in clinic with worsening SOB on exertion. TTE 04/2020 with EF 60-65%, G1DD, normal RV, trivial MR.   She was admitted 3/31 - 09/21/2020 with perforated acute appendicitis.  She was treated with IV antibiotics and surgery deferred.  She was admitted 11/02/2020 for appendectomy.   Saw [name] on 02/15/2021 where she was having persistent DOE. Underwent We coronary CTA 02/2021 which showed Ca score 0 and no significant blockages.  Last seen in clinic on 06/2021 where she was doing well from a CV standpoint.   Today, the patient overall feels well. No chest pain. Continues to have some dyspnea on exertion but this is unchanged. No lightheadedness, dizziness, syncope. No LE edema, orthopnea, PND. No palpitations. Blood pressure runs 100s at home. Has lost weight with synjardy and feels like her blood pressure has improved with this. Hoping to lower her BP meds today.  Past Medical History:  Diagnosis Date   Anemia    Dyspnea    with exertion    Endometriosis    GERD (gastroesophageal reflux disease)    Hypertension    Meningioma (HCC)    Nontoxic multinodular goiter    Occlusion and stenosis of carotid artery without mention of cerebral infarction    Peptic ulcer  disease    PVD (peripheral vascular disease) ([place])     Past Surgical History:  Procedure Laterality Date   CT RADIATION THERAPY GUIDE     CT done yearly for menigioma with radiation if needed   CYST EXCISION     head   CYST EXCISION Left    x 2    cyst removed     left wrist   endometriosis surgery      LAPAROSCOPIC APPENDECTOMY N/A 11/02/2020   Procedure: APPENDECTOMY LAPAROSCOPIC;  Surgeon: Clovis Riley, MD;  Location: [facility];  Service: General;  Laterality: N/A;   ORIF ANKLE FRACTURE     right   OVARIAN CYST REMOVAL     right   SHOULDER SURGERY Right    Spur removal   [place]  2012   right thumb   TUBAL LIGATION     VIDEO BRONCHOSCOPY  11/10/2011   Procedure: VIDEO BRONCHOSCOPY WITH FLUORO;  Surgeon: Tanda Rockers, MD;  Location: [facility] END[facility];  Service: Cardiopulmonary;  Laterality: Bilateral;    Current Medications: Current Meds  Medication Sig   amLODipine (NORVASC) 5 MG tablet Take 1 tablet (5 mg total) by mouth daily.   aspirin 325 MG tablet Take 325 mg by mouth daily.    atorvastatin (LIPITOR) 80 MG tablet Take 1 tablet (80 mg total) by mouth daily.   Cholecalciferol (VITAMIN D) 125 MCG (5000 UT) CAPS  Take by mouth.   Empagliflozin-metFORMIN HCl (SYNJARDY) 5-500 MG TABS Take 1 tablet by mouth 2 (two) times daily.   gabapentin (NEURONTIN) 100 MG capsule Take 100 mg by mouth 3 (three) times daily as needed.   losartan (COZAAR) 100 MG tablet Take 1 tablet (100 mg total) by mouth daily.   metoprolol succinate (TOPROL XL) 25 MG 24 hr tablet Take 1 tablet (25 mg total) by mouth daily after supper.   Multiple Vitamins-Minerals (MULTIVITAMINS THER. W/MINERALS) TABS Take 1 tablet by mouth daily as needed (nutrition).   pantoprazole (PROTONIX) 40 MG tablet TAKE 1 TABLET BY MOUTH EVERY DAY IN THE MORNING     Allergies:   Patient has no known allergies.   Social History   Socioeconomic History   Marital status: Married    Spouse  name: Not on file   Number of children: 1   Years of education: Not on file   Highest education level: Not on file  Occupational History   Occupation: Geophysical data processor     Employer: [employer]: Retired  Tobacco Use   Smoking status: Former    Packs/day: 0.30    Years: 3.00    Total pack years: 0.90    Types: Cigarettes    Quit date: 06/20/1981    Years since quitting: 40.6   Smokeless tobacco: Never  Vaping Use   Vaping Use: Never used  Substance and Sexual Activity   Alcohol use: No   Drug use: No   Sexual activity: Not Currently    Birth control/protection: Post-menopausal  Other Topics Concern   Not on file  Social History Narrative   HSG-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  Daughter - 1979, 2 step-sons; 2 grand-children. Retired - from [employer] entry, got "out-sourced." She does keep her grandchildren.             Social Determinants of Health   Financial Resource Strain: Not on file  Food Insecurity: Not on file  Transportation Needs: Not on file  Physical Activity: Not on file  Stress: Not on file  Social Connections: Not on file     Family History: The patient's family history includes Colon cancer in her maternal grandmother; Dementia in her mother; Diabetes in her mother; Esophageal cancer in her father; Heart disease in her mother; Heart failure in her mother; Hypertension in her mother; Lung cancer in her mother. There is no history of Breast cancer or Coronary artery disease.  ROS:   Please see the history of present illness.    Review of Systems  Constitutional:  Positive for malaise/fatigue. Negative for weight loss.  HENT:  Negative for congestion and sinus pain.   Eyes:  Negative for blurred vision.  Respiratory:  Positive for shortness of breath. Negative for cough.   Cardiovascular:  Negative for chest pain, palpitations, orthopnea, claudication, leg swelling and PND.  Gastrointestinal:  Negative for  blood in stool and melena.  Genitourinary:  Negative for dysuria.  Musculoskeletal:  Positive for back pain. Negative for joint pain and myalgias.  Skin:  Negative for itching and rash.  Neurological:  Positive for sensory change (Hot Flashes). Negative for dizziness and headaches.  Endo/Heme/Allergies:  Does not bruise/bleed easily.  Psychiatric/Behavioral:  The patient is not nervous/anxious and does not have insomnia.       EKGs/Labs/Other Studies Reviewed:    The following studies were reviewed today: Coronary CTA 02/2021: FINDINGS: Coronary calcium score: The  patient's coronary artery calcium score is 0, which places the patient in the 0 percentile.   Coronary arteries: Normal coronary origins.  Right dominance.   Right Coronary Artery: Normal caliber vessel, gives rise to PDA. No significant plaque or stenosis.   Left Main Coronary Artery: Normal caliber vessel. No significant plaque or stenosis.   Left Anterior Descending Coronary Artery: Normal caliber vessel. No significant plaque or stenosis. Gives rise to 2 diagonal branches.   Left Circumflex Artery: Normal caliber vessel. No significant plaque or stenosis. Gives rise to 1 large OM branch.   Aorta: Normal size, 30 mm at the mid ascending aorta (level of the PA bifurcation) measured double oblique. No calcifications. No dissection seen in visualized portions of the aorta.   Aortic Valve: No calcifications. Trileaflet.   Other findings:   Normal pulmonary vein drainage into the left atrium.   Normal left atrial appendage without a thrombus.   Normal size of the pulmonary artery.   Normal appearance of the pericardium.   IMPRESSION: 1. No evidence of CAD, CADRADS = 0.   2. Coronary calcium score of 0. This was 0 percentile for age and sex matched control.   3. Normal coronary origin with right dominance. Myoview 02/2020: There was <36m ST segment depression noted during stress. The left ventricular  ejection fraction is normal (55-65%). Nuclear stress EF: 62%. Defect 1: There is a small defect of mild severity present in the apical anterior location. Findings consistent with ischemia. This is a low risk study.   1. Reversible perfusion defect in apical anterior wall suggestive of small area of ischemia 2. Low risk study given small area of ischemia.  Would recommend referral to cardiology for further evaluation  TTE 04/2020: IMPRESSIONS     1. Left ventricular ejection fraction, by estimation, is 60 to 65%. The  left ventricle has normal function. The left ventricle has no regional  wall motion abnormalities. Left ventricular diastolic parameters are  consistent with Grade I diastolic dysfunction (impaired relaxation).   2. Right ventricular systolic function is normal. The right ventricular  size is normal. There is normal pulmonary artery systolic pressure.   3. The mitral valve is normal in structure. Trivial mitral valve  regurgitation. No evidence of mitral stenosis.   4. The aortic valve is tricuspid. Aortic valve regurgitation is not  visualized. No aortic stenosis is present.   5. The inferior vena cava is normal in size with greater than 50%  respiratory variability, suggesting right atrial pressure of 3 mmHg.   TTE 09/01/2011: Study Conclusions   - Procedure narrative: Transthoracic echocardiography. Image    quality was poor.  - Left ventricle: The cavity size was normal. Wall thickness    was normal. Systolic function was normal. The estimated    ejection fraction was in the range of 55% to 60%. Left    ventricular diastolic function parameters were normal.  Transthoracic echocardiography.  M-mode, complete 2D,  spectral Doppler, and color Doppler.  Height:  Height:  167.6cm. Height: 66in.  Weight:  Weight: 88.9kg. Weight:  195.6lb.  Body mass index:  BMI: 31.6kg/m^2.  Body surface  area:    BSA: 1.913m.  Blood pressure:     109/67.  Patient  status:   Inpatient.  Location:  Echo laboratory.   ------------------------------------------------------------   ------------------------------------------------------------  Left ventricle:  The cavity size was normal. Wall thickness  was normal. Systolic function was normal. The estimated  ejection fraction was in the range of 55% to  60%. The  transmitral flow pattern was normal. The deceleration time  of the early transmitral flow velocity was normal. The  pulmonary vein flow pattern was normal. The tissue Doppler  parameters were normal. Left ventricular diastolic function  parameters were normal.   ------------------------------------------------------------  Aortic valve:   Structurally normal valve.   Cusp separation  was normal.  Doppler:  Transvalvular velocity was within the  normal range. There was no stenosis.  No regurgitation.   ------------------------------------------------------------  Aorta:  The aorta was normal, not dilated, and non-diseased.     ------------------------------------------------------------  Mitral valve:   Structurally normal valve.   Leaflet  separation was normal.  Doppler:  Transvalvular velocity was  within the normal range. There was no evidence for stenosis.   Trivial regurgitation.   ------------------------------------------------------------  Left atrium:  The atrium was normal in size.   ------------------------------------------------------------  Atrial septum:  Poorly visualized.   ------------------------------------------------------------  Right ventricle:  The cavity size was normal. Wall thickness  was normal. The moderator band was prominent. Systolic  function was normal.   ------------------------------------------------------------  Pulmonic valve:   Poorly visualized.  Doppler:   No  significant regurgitation.   ------------------------------------------------------------  Tricuspid valve:  Poorly visualized.   Structurally normal  valve.   Leaflet separation was normal.  Doppler:  Transvalvular velocity was within the normal range.  No  regurgitation.   ------------------------------------------------------------  Pulmonary artery:   Poorly visualized.   ------------------------------------------------------------  Right atrium:  Poorly visualized.   ------------------------------------------------------------  Pericardium:  There was no pericardial effusion.     Myoview 2013: Findings: Multiplanar SPECT imaging shows no fixed or reversible  perfusion defect within the left ventricular myocardium.  Left  jugular cavity size appears grossly normal.   Images obtained with cardiac gating displayed using a surface  rendering algorithm show no segmental wall motion abnormality.   Left ventricular end-diastolic volume is calculated at 51 ml.  Left  ventricular end systolic volume is calculated 11 ml.  The derived  left ventricular ejection fraction is probably over estimated at  70%.   IMPRESSION:  No evidence for inducible ischemia in the left ventricle.   Normal left ventricular ejection fraction.    Carotid artery doppler Summary:   - Right ICA appears chronically occluded. No significant    left ICA stenosis or plaque noted in the ICA.  - Findings consistent with .   EKG:  EKG 01/17/22: Sinus bradycardia with HR 59  Recent Labs: 02/04/2021: TSH 2.04 04/21/2021: ALT 10 06/08/2021: Magnesium 1.9 11/10/2021: BUN 8; Creatinine, Ser 0.76; Hemoglobin 10.8; Platelets 254.0; Potassium 3.6; Sodium 141  Recent Lipid Panel    Component Value Date/Time   CHOL 131 04/21/2021 0000   TRIG 74 04/21/2021 0000   TRIG 41 05/05/2006 1206   HDL 39 (L) 04/21/2021 0000   CHOLHDL 3.4 04/21/2021 0000   CHOLHDL 3.4 03/02/2020 1458   VLDL 19.6 01/29/2019 1504   LDLCALC 77 04/21/2021 0000   LDLCALC 83 03/02/2020 1458      Physical Exam:    VS:  BP 104/72   Pulse (!) 59   Ht 5' 6.5" (1.689  m)   Wt 169 lb (76.7 kg)   BMI 26.87 kg/m     Wt Readings from Last 3 Encounters:  01/17/22 169 lb (76.7 kg)  11/10/21 172 lb (78 kg)  07/20/21 182 lb 12.8 oz (82.9 kg)     GEN:  Comfortable, NAD HEENT: Normal NECK: No JVD; no carotid bruits CARDIAC: RRR, no  murmurs, rubs, gallops RESPIRATORY:  CTAB ABDOMEN: Soft, non-tender, non-distended MUSCULOSKELETAL:  No edema; No deformity  SKIN: Warm and dry NEUROLOGIC:  Alert and oriented x 3 PSYCHIATRIC:  Normal affect   ASSESSMENT:    1. DOE (dyspnea on exertion)   2. Essential hypertension   3. Type II diabetes mellitus with manifestations ([place])   4. Dyspnea on exertion   5. Hyperlipidemia LDL goal <70   6. Occlusion of right carotid artery   7. Other fatigue   8. Pure hypercholesterolemia     PLAN:    In order of problems listed above:  #Dyspnea on Exertion: #Positive Stress Test: Myoview 02/2020 with mild defect in apical anterior location. Follow-up Coronary CTA with no significant disease and Ca score 0. TTE 04/2020 with LVEF 60-65%, G1DD, no significant valve disease.  -Continue ASA '325mg'$  (on higher dose due to carotid artery occlusion) -Continue lipitor '40mg'$  daily -Stop metop '25mg'$  XL as blood pressure soft with weight loss -Continue losartan '100mg'$  daily  #Persistent Fatigue: #Mild Anemia: Unclear etiology given reassuring cardiac work-up. TSH normal. Possibly anemia is contributing. Will continue iron supplementation and continue follow-up with PCP -Continue iron supplementation  #Carotid artery disease: Carotid ultrasound 2013 with occluded right carotid artery. -Continue ASA '325mg'$  as prescribed by Neurology -Continue atorvastatin to '40mg'$  as above  #Hypertension: Well controlled and running on the lower side with her weight loss. Can stop metop at this time and monitor.  -Continue losartan '100mg'$  as above -Stop metop '25mg'$  XL as blood pressure soft with weight loss -Continue amlodipine '5mg'$   daily  #HLD: -Continue atorvastatin '40mg'$  daily; goal LDL <70 -Repeat lipids today for monitoring  #DMII: Managed by PCP. -Continue Synjardy   Medication Adjustments/Labs and Tests Ordered: Current medicines are reviewed at length with the patient today.  Concerns regarding medicines are outlined above.  Orders Placed This Encounter  Procedures   Lipid Profile   EKG 12-Lead   No orders of the defined types were placed in this encounter.   Patient Instructions  Medication Instructions:   Your physician recommends that you continue on your current medications as directed. Please refer to the Current Medication list given to you today.  *If you need a refill on your cardiac medications before your next appointment, please call your pharmacy*   Lab Work:  TODAY--LIPIDS  If you have labs (blood work) drawn today and your tests are completely normal, you will receive your results only by: [place] (if you have MyChart) OR A paper copy in the mail If you have any lab test that is abnormal or we need to change your treatment, we will call you to review the results.    Follow-Up: At [facility], you and your health needs are our priority.  As part of our continuing mission to provide you with exceptional heart care, we have created designated Provider Care Teams.  These Care Teams include your primary Cardiologist (physician) and Advanced Practice Providers (APPs -  Physician Assistants and Nurse Practitioners) who all work together to provide you with the care you need, when you need it.  We recommend signing up for the patient portal called "MyChart".  Sign up information is provided on this After Visit Summary.  MyChart is used to connect with patients for Virtual Visits (Telemedicine).  Patients are able to view lab/test results, encounter notes, upcoming appointments, etc.  Non-urgent messages can be sent to your provider as well.   To learn more about what you can do  with  MyChart, go to [email or web address].    Your next appointment:   6 month(s)  The format for your next appointment:   In Person  Provider:   Freada Bergeron, MD OR AN EXTENDER   Important Information About Sugar         Signed, Freada Bergeron, MD  01/17/2022 11:09 AM    [place]

## 2022-01-17 ENCOUNTER — Ambulatory Visit (INDEPENDENT_AMBULATORY_CARE_PROVIDER_SITE_OTHER): Payer: Medicare Other | Admitting: Cardiology

## 2022-01-17 ENCOUNTER — Other Ambulatory Visit: Payer: Self-pay

## 2022-01-17 ENCOUNTER — Encounter: Payer: Self-pay | Admitting: Cardiology

## 2022-01-17 VITALS — BP 104/72 | HR 59 | Ht 66.5 in | Wt 169.0 lb

## 2022-01-17 DIAGNOSIS — R0609 Other forms of dyspnea: Secondary | ICD-10-CM | POA: Diagnosis not present

## 2022-01-17 DIAGNOSIS — I10 Essential (primary) hypertension: Secondary | ICD-10-CM | POA: Diagnosis not present

## 2022-01-17 DIAGNOSIS — E785 Hyperlipidemia, unspecified: Secondary | ICD-10-CM | POA: Diagnosis not present

## 2022-01-17 DIAGNOSIS — E118 Type 2 diabetes mellitus with unspecified complications: Secondary | ICD-10-CM

## 2022-01-17 DIAGNOSIS — E7800 Pure hypercholesterolemia, unspecified: Secondary | ICD-10-CM

## 2022-01-17 DIAGNOSIS — I6521 Occlusion and stenosis of right carotid artery: Secondary | ICD-10-CM

## 2022-01-17 DIAGNOSIS — R5383 Other fatigue: Secondary | ICD-10-CM

## 2022-01-17 LAB — LIPID PANEL
Chol/HDL Ratio: 2.6 ratio (ref 0.0–4.4)
Cholesterol, Total: 119 mg/dL (ref 100–199)
HDL: 45 mg/dL (ref >39)
LDL Chol Calc (NIH): 62 mg/dL (ref 0–99)
Triglycerides: 53 mg/dL (ref 0–149)
VLDL Cholesterol Cal: 12 mg/dL (ref 5–40)

## 2022-01-17 MED ORDER — AMLODIPINE BESYLATE 5 MG PO TABS: 5.0000 mg | ORAL_TABLET | Freq: Every day | ORAL | 3 refills | Status: DC

## 2022-01-17 NOTE — Patient Instructions (Signed)
Medication Instructions:   Your physician recommends that you continue on your current medications as directed. Please refer to the Current Medication list given to you today.  *If you need a refill on your cardiac medications before your next appointment, please call your pharmacy*   Lab Work:  TODAY--LIPIDS  If you have labs (blood work) drawn today and your tests are completely normal, you will receive your results only by: [place] (if you have MyChart) OR A paper copy in the mail If you have any lab test that is abnormal or we need to change your treatment, we will call you to review the results.    Follow-Up: At [facility], you and your health needs are our priority.  As part of our continuing mission to provide you with exceptional heart care, we have created designated Provider Care Teams.  These Care Teams include your primary Cardiologist (physician) and Advanced Practice Providers (APPs -  Physician Assistants and Nurse Practitioners) who all work together to provide you with the care you need, when you need it.  We recommend signing up for the patient portal called "MyChart".  Sign up information is provided on this After Visit Summary.  MyChart is used to connect with patients for Virtual Visits (Telemedicine).  Patients are able to view lab/test results, encounter notes, upcoming appointments, etc.  Non-urgent messages can be sent to your provider as well.   To learn more about what you can do with MyChart, go to [email or web address].    Your next appointment:   6 month(s)  The format for your next appointment:   In Person  Provider:   Freada Bergeron, MD OR AN EXTENDER   Important Information About Sugar

## 2022-02-26 ENCOUNTER — Other Ambulatory Visit: Payer: Self-pay | Admitting: Family

## 2022-04-26 LAB — HM DIABETES EYE EXAM

## 2022-05-16 ENCOUNTER — Ambulatory Visit (INDEPENDENT_AMBULATORY_CARE_PROVIDER_SITE_OTHER): Payer: Medicare Other | Admitting: Internal Medicine

## 2022-05-16 ENCOUNTER — Encounter: Payer: Self-pay | Admitting: Internal Medicine

## 2022-05-16 VITALS — BP 116/76 | HR 82 | Temp 98.0°F | Ht 66.5 in | Wt 167.0 lb

## 2022-05-16 DIAGNOSIS — I10 Essential (primary) hypertension: Secondary | ICD-10-CM

## 2022-05-16 DIAGNOSIS — I5189 Other ill-defined heart diseases: Secondary | ICD-10-CM | POA: Diagnosis not present

## 2022-05-16 DIAGNOSIS — E118 Type 2 diabetes mellitus with unspecified complications: Secondary | ICD-10-CM

## 2022-05-16 DIAGNOSIS — Z0000 Encounter for general adult medical examination without abnormal findings: Secondary | ICD-10-CM | POA: Diagnosis not present

## 2022-05-16 DIAGNOSIS — E042 Nontoxic multinodular goiter: Secondary | ICD-10-CM | POA: Diagnosis not present

## 2022-05-16 LAB — CBC WITH DIFFERENTIAL/PLATELET
Basophils Absolute: 0.1 10*3/uL (ref 0.0–0.1)
Basophils Relative: 1.2 % (ref 0.0–3.0)
Eosinophils Absolute: 0.4 10*3/uL (ref 0.0–0.7)
Eosinophils Relative: 4.2 % (ref 0.0–5.0)
HCT: 32.7 % — ABNORMAL LOW (ref 36.0–46.0)
Hemoglobin: 10.7 g/dL — ABNORMAL LOW (ref 12.0–15.0)
Lymphocytes Relative: 28.5 % (ref 12.0–46.0)
Lymphs Abs: 2.7 10*3/uL (ref 0.7–4.0)
MCHC: 32.6 g/dL (ref 30.0–36.0)
MCV: 85.1 fl (ref 78.0–100.0)
Monocytes Absolute: 0.7 10*3/uL (ref 0.1–1.0)
Monocytes Relative: 7.3 % (ref 3.0–12.0)
Neutro Abs: 5.5 10*3/uL (ref 1.4–7.7)
Neutrophils Relative %: 58.8 % (ref 43.0–77.0)
Platelets: 258 10*3/uL (ref 150.0–400.0)
RBC: 3.84 Mil/uL — ABNORMAL LOW (ref 3.87–5.11)
RDW: 17.9 % — ABNORMAL HIGH (ref 11.5–15.5)
WBC: 9.4 10*3/uL (ref 4.0–10.5)

## 2022-05-16 LAB — BASIC METABOLIC PANEL
BUN: 10 mg/dL (ref 6–23)
CO2: 28 mEq/L (ref 19–32)
Calcium: 8.9 mg/dL (ref 8.4–10.5)
Chloride: 101 mEq/L (ref 96–112)
Creatinine, Ser: 0.77 mg/dL (ref 0.40–1.20)
GFR: 80.78 mL/min (ref >60.00)
Glucose, Bld: 96 mg/dL (ref 70–99)
Potassium: 3.6 mEq/L (ref 3.5–5.1)
Sodium: 135 mEq/L (ref 135–145)

## 2022-05-16 LAB — HEMOGLOBIN A1C: Hgb A1c MFr Bld: 6.7 % — ABNORMAL HIGH (ref 4.6–6.5)

## 2022-05-16 LAB — TSH: TSH: 1.27 u[IU]/mL (ref 0.35–5.50)

## 2022-05-16 MED ORDER — SYNJARDY 5-500 MG PO TABS: 1.0000 | ORAL_TABLET | Freq: Two times a day (BID) | ORAL | 1 refills | Status: DC

## 2022-05-16 NOTE — Patient Instructions (Signed)

## 2022-05-16 NOTE — Progress Notes (Signed)
Subjective:  Patient ID: Jaime [name], female    DOB: Nov 04, 1955  Age: 66 y.o. MRN: [id number]  CC: Annual Exam, Anemia, Hypertension, Hyperlipidemia, and Diabetes   HPI Jaime [name] presents for a CPX and f/up -   She is an active walker and has good endurance.  She denies chest pain, shortness of breath, diaphoresis, or edema.  Outpatient Medications Prior to Visit  Medication Sig Dispense Refill   amLODipine (NORVASC) 5 MG tablet Take 1 tablet (5 mg total) by mouth daily. 90 tablet 3   aspirin 325 MG tablet Take 325 mg by mouth daily.      atorvastatin (LIPITOR) 80 MG tablet TAKE 1 TABLET BY MOUTH EVERY DAY 30 tablet 11   Cholecalciferol (VITAMIN D) 125 MCG (5000 UT) CAPS Take by mouth.     gabapentin (NEURONTIN) 100 MG capsule Take 100 mg by mouth 3 (three) times daily as needed.     losartan (COZAAR) 100 MG tablet Take 1 tablet (100 mg total) by mouth daily. 90 tablet 2   Multiple Vitamins-Minerals (MULTIVITAMINS THER. W/MINERALS) TABS Take 1 tablet by mouth daily as needed (nutrition).     pantoprazole (PROTONIX) 40 MG tablet TAKE 1 TABLET BY MOUTH EVERY DAY IN THE MORNING 30 tablet 5   Empagliflozin-metFORMIN HCl (SYNJARDY) 5-500 MG TABS Take 1 tablet by mouth 2 (two) times daily. 180 tablet 1   metoprolol succinate (TOPROL XL) 25 MG 24 hr tablet Take 1 tablet (25 mg total) by mouth daily after supper. 90 tablet 1   No facility-administered medications prior to visit.    ROS Review of Systems  Constitutional: Negative.  Negative for diaphoresis, fatigue and unexpected weight change.  HENT: Negative.    Eyes: Negative.   Respiratory: Negative.  Negative for cough, chest tightness, shortness of breath and wheezing.   Cardiovascular:  Negative for chest pain, palpitations and leg swelling.  Gastrointestinal: Negative.  Negative for abdominal pain, constipation, diarrhea, nausea and vomiting.  Endocrine: Negative.   Genitourinary: Negative.  Negative for difficulty  urinating.  Musculoskeletal: Negative.  Negative for arthralgias.  Skin: Negative.  Negative for color change.  Neurological: Negative.  Negative for dizziness, weakness and light-headedness.  Hematological:  Negative for adenopathy. Does not bruise/bleed easily.  Psychiatric/Behavioral: Negative.      Objective:  BP 116/76 (BP Location: Left Arm, Patient Position: Sitting, Cuff Size: Large)   Pulse 82   Temp 98 F (36.7 C) (Oral)   Ht 5' 6.5" (1.689 m)   Wt 167 lb (75.8 kg)   SpO2 97%   BMI 26.55 kg/m   BP Readings from Last 3 Encounters:  05/16/22 116/76  01/17/22 104/72  11/10/21 136/88    Wt Readings from Last 3 Encounters:  05/16/22 167 lb (75.8 kg)  01/17/22 169 lb (76.7 kg)  11/10/21 172 lb (78 kg)    Physical Exam Vitals reviewed.  HENT:     Nose: Nose normal.     Mouth/Throat:     Mouth: Mucous membranes are moist.  Eyes:     General: No scleral icterus.    Conjunctiva/sclera: Conjunctivae normal.  Neck:     Thyroid: No thyroid mass, thyromegaly or thyroid tenderness.  Cardiovascular:     Rate and Rhythm: Normal rate and regular rhythm.     Heart sounds: No murmur heard. Pulmonary:     Effort: Pulmonary effort is normal.     Breath sounds: No stridor. No wheezing, rhonchi or rales.  Abdominal:  General: Abdomen is flat.     Palpations: There is no mass.     Tenderness: There is no abdominal tenderness. There is no guarding.     Hernia: No hernia is present.  Musculoskeletal:        General: Normal range of motion.     Cervical back: Neck supple.     Right lower leg: No edema.     Left lower leg: No edema.  Lymphadenopathy:     Cervical: No cervical adenopathy.     Right cervical: No superficial cervical adenopathy.    Left cervical: No superficial cervical adenopathy.  Skin:    General: Skin is warm and dry.  Neurological:     General: No focal deficit present.     Mental Status: She is alert.  Psychiatric:        Mood and Affect: Mood  normal.        Behavior: Behavior normal.     Lab Results  Component Value Date   WBC 9.4 05/16/2022   HGB 10.7 (L) 05/16/2022   HCT 32.7 (L) 05/16/2022   PLT 258.0 05/16/2022   GLUCOSE 96 05/16/2022   CHOL 119 01/17/2022   TRIG 53 01/17/2022   HDL 45 01/17/2022   LDLCALC 62 01/17/2022   ALT 10 04/21/2021   AST 18 04/21/2021   NA 135 05/16/2022   K 3.6 05/16/2022   CL 101 05/16/2022   CREATININE 0.77 05/16/2022   BUN 10 05/16/2022   CO2 28 05/16/2022   TSH 1.27 05/16/2022   HGBA1C 6.7 (H) 05/16/2022   MICROALBUR 0.8 06/08/2021    CT Chest Wo Contrast  Result Date: 11/03/2021 CLINICAL DATA:  Lung nodule follow-up, right lower lobe. EXAM: CT CHEST WITHOUT CONTRAST TECHNIQUE: Multidetector CT imaging of the chest was performed following the standard protocol without IV contrast. RADIATION DOSE REDUCTION: This exam was performed according to the departmental dose-optimization program which includes automated exposure control, adjustment of the mA and/or kV according to patient size and/or use of iterative reconstruction technique. COMPARISON:  CT chest abdomen and pelvis 09/17/2020. FINDINGS: Cardiovascular: No significant vascular findings. Normal heart size. No pericardial effusion. Mediastinum/Nodes: There are calcified mediastinal and hilar lymph nodes. No discrete enlarged lymph nodes are identified allowing for lack of intravenous contrast. Esophagus and visualized thyroid gland are within normal limits. Lungs/Pleura: There is some scattered calcified nodules bilaterally, unchanged from the prior examination. There is a stable 4 mm fissural nodule in the right lower lobe image 3/74. There is some scattered micro nodules in both lungs measuring 3 mm or less, unchanged. There is a single new 2 mm nodular density in the left lower lobe image 3/90. The lungs are otherwise clear. No pleural effusion or pneumothorax. Upper Abdomen: No acute abnormality. Musculoskeletal: T7 vertebral body  hemangioma again noted. No acute fractures. IMPRESSION: 1. Unchanged nodular densities measuring up to 4 mm. No follow-up needed if patient is low-risk (and has no known or suspected primary neoplasm). Non-contrast chest CT can be considered in 12 months if patient is high-risk. This recommendation follows the consensus statement: Guidelines for Management of Incidental Pulmonary Nodules Detected on CT Images: From the Fleischner Society 2017; Radiology 2017; 284:228-243. 2.  Old granulomatous disease. Electronically Signed   By: Ronney Asters M.D.   On: 11/03/2021 18:34    Assessment & Plan:   Amayiah was seen today for annual exam, anemia, hypertension, hyperlipidemia and diabetes.  Diagnoses and all orders for this visit:  Essential hypertension- Her blood  pressure is well-controlled. -     Basic metabolic panel; Future -     CBC with Differential/Platelet; Future -     CBC with Differential/Platelet -     Basic metabolic panel  Type II diabetes mellitus with manifestations ([place])- Her blood sugar is adequately well-controlled. -     Basic metabolic panel; Future -     Hemoglobin A1c; Future -     Hemoglobin A1c -     Basic metabolic panel -     Empagliflozin-metFORMIN HCl (SYNJARDY) 5-500 MG TABS; Take 1 tablet by mouth 2 (two) times daily.  GOITER, MULTINODULAR- There are no thyroid symptoms or abnormal findings and she is euthyroid. -     TSH; Future -     TSH  Routine general medical examination at a health care facility- Exam completed, labs reviewed, vaccines reviewed and updated, cancer screenings are up-to-date, patient education was given.  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  Will continue the SGLT2 inhibitor. -     Empagliflozin-metFORMIN HCl (SYNJARDY) 5-500 MG TABS; Take 1 tablet by mouth 2 (two) times daily.   I have discontinued Katalena Malveaux. Christen's metoprolol succinate. I am also having her maintain her multivitamins ther.  w/minerals, aspirin, losartan, Vitamin D, gabapentin, pantoprazole, amLODipine, atorvastatin, and Synjardy.  Meds ordered this encounter  Medications   Empagliflozin-metFORMIN HCl (SYNJARDY) 5-500 MG TABS    Sig: Take 1 tablet by mouth 2 (two) times daily.    Dispense:  180 tablet    Refill:  1     Follow-up: Return in about 6 months (around 11/14/2022).  Scarlette Calico, MD

## 2022-07-28 ENCOUNTER — Telehealth: Payer: Self-pay | Admitting: Pharmacy Technician

## 2022-07-28 DIAGNOSIS — Z596 Low income: Secondary | ICD-10-CM

## 2022-07-28 NOTE — Progress Notes (Signed)
[facility])                                            [place] Team    07/28/2022  Jaime [name] 06/30/1955 [id number]                                      Medication Assistance Referral  Referral From: [place] (Pharmacy Student Estill Bamberg)  Medication/Company: [employer]va [employer] / [employer] Patient application portion:  Mailed Provider application portion: Faxed  to Dr. Scarlette Calico Provider address/fax verified via: Office website   [name], [place]  [phone]

## 2022-08-22 ENCOUNTER — Ambulatory Visit (INDEPENDENT_AMBULATORY_CARE_PROVIDER_SITE_OTHER): Payer: Medicare Other

## 2022-08-22 VITALS — Ht 66.5 in | Wt 160.0 lb

## 2022-08-22 DIAGNOSIS — Z1211 Encounter for screening for malignant neoplasm of colon: Secondary | ICD-10-CM | POA: Diagnosis not present

## 2022-08-22 DIAGNOSIS — Z0000 Encounter for general adult medical examination without abnormal findings: Secondary | ICD-10-CM

## 2022-08-22 NOTE — Patient Instructions (Signed)
[name] , Thank you for taking time to come for your Medicare Wellness Visit. I appreciate your ongoing commitment to your health goals. Please review the following plan we discussed and let me know if I can assist you in the future.   These are the goals we discussed:  Goals      Client understands the importance of follow-up with providers by attending scheduled visits        This is a list of the screening recommended for you and due dates:  Health Maintenance  Topic Date Due   DEXA scan (bone density measurement)  Never done   COVID-19 Vaccine (5 - 2023-24 season) 05/11/2022   Yearly kidney health urinalysis for diabetes  06/08/2022   Complete foot exam   11/11/2022   Hemoglobin A1C  11/14/2022   Colon Cancer Screening  04/03/2023   Eye exam for diabetics  04/27/2023   Yearly kidney function blood test for diabetes  05/17/2023   Pap Smear  07/03/2023   Medicare Annual Wellness Visit  08/22/2023   Mammogram  09/22/2023   DTaP/Tdap/Td vaccine (4 - Td or Tdap) 04/05/2027   Pneumonia Vaccine  Completed   Flu Shot  Completed   Hepatitis C Screening: USPSTF Recommendation to screen - Ages 98-66 yo.  Completed   HIV Screening  Completed   Zoster (Shingles) Vaccine  Completed   HPV Vaccine  Aged Out    Advanced directives: No  Conditions/risks identified: Yes  Next appointment: Follow up in one year for your annual wellness visit.   Preventive Care 66 Years and Older, Female Preventive care refers to lifestyle choices and visits with your health care provider that can promote health and wellness. What does preventive care include? A yearly physical exam. This is also called an annual well check. Dental exams once or twice a year. Routine eye exams. Ask your health care provider how often you should have your eyes checked. Personal lifestyle choices, including: Daily care of your teeth and gums. Regular physical activity. Eating a healthy diet. Avoiding tobacco and  drug use. Limiting alcohol use. Practicing safe sex. Taking low-dose aspirin every day. Taking vitamin and mineral supplements as recommended by your health care provider. What happens during an annual well check? The services and screenings done by your health care provider during your annual well check will depend on your age, overall health, lifestyle risk factors, and family history of disease. Counseling  Your health care provider may ask you questions about your: Alcohol use. Tobacco use. Drug use. Emotional well-being. Home and relationship well-being. Sexual activity. Eating habits. History of falls. Memory and ability to understand (cognition). Work and work Statistician. Reproductive health. Screening  You may have the following tests or measurements: Height, weight, and BMI. Blood pressure. Lipid and cholesterol levels. These may be checked every 5 years, or more frequently if you are over 42 years old. Skin check. Lung cancer screening. You may have this screening every year starting at age 63 if you have a 30-pack-year history of smoking and currently smoke or have quit within the past 15 years. Fecal occult blood test (FOBT) of the stool. You may have this test every year starting at age 26. Flexible sigmoidoscopy or colonoscopy. You may have a sigmoidoscopy every 5 years or a colonoscopy every 10 years starting at age 6. Hepatitis C blood test. Hepatitis B blood test. Sexually transmitted disease (STD) testing. Diabetes screening. This is done by checking your blood sugar (glucose) after you have  not eaten for a while (fasting). You may have this done every 1-3 years. Bone density scan. This is done to screen for osteoporosis. You may have this done starting at age 68. Mammogram. This may be done every 1-2 years. Talk to your health care provider about how often you should have regular mammograms. Talk with your health care provider about your test results, treatment  options, and if necessary, the need for more tests. Vaccines  Your health care provider may recommend certain vaccines, such as: Influenza vaccine. This is recommended every year. Tetanus, diphtheria, and acellular pertussis (Tdap, Td) vaccine. You may need a Td booster every 10 years. Zoster vaccine. You may need this after age 75. Pneumococcal 13-valent conjugate (PCV13) vaccine. One dose is recommended after age 44. Pneumococcal polysaccharide (PPSV23) vaccine. One dose is recommended after age 40. Talk to your health care provider about which screenings and vaccines you need and how often you need them. This information is not intended to replace advice given to you by your health care provider. Make sure you discuss any questions you have with your health care provider. Document Released: 07/03/2015 Document Revised: 02/24/2016 Document Reviewed: 04/07/2015 Elsevier Interactive Patient Education  2017 [place] Prevention in the Home Falls can cause injuries. They can happen to people of all ages. There are many things you can do to make your home safe and to help prevent falls. What can I do on the outside of my home? Regularly fix the edges of walkways and driveways and fix any cracks. Remove anything that might make you trip as you walk through a door, such as a raised step or threshold. Trim any bushes or trees on the path to your home. Use bright outdoor lighting. Clear any walking paths of anything that might make someone trip, such as rocks or tools. Regularly check to see if handrails are loose or broken. Make sure that both sides of any steps have handrails. Any raised decks and porches should have guardrails on the edges. Have any leaves, snow, or ice cleared regularly. Use sand or salt on walking paths during winter. Clean up any spills in your garage right away. This includes oil or grease spills. What can I do in the bathroom? Use night lights. Install grab  bars by the toilet and in the tub and shower. Do not use towel bars as grab bars. Use non-skid mats or decals in the tub or shower. If you need to sit down in the shower, use a plastic, non-slip stool. Keep the floor dry. Clean up any water that spills on the floor as soon as it happens. Remove soap buildup in the tub or shower regularly. Attach bath mats securely with double-sided non-slip rug tape. Do not have throw rugs and other things on the floor that can make you trip. What can I do in the bedroom? Use night lights. Make sure that you have a light by your bed that is easy to reach. Do not use any sheets or blankets that are too big for your bed. They should not hang down onto the floor. Have a firm chair that has side arms. You can use this for support while you get dressed. Do not have throw rugs and other things on the floor that can make you trip. What can I do in the kitchen? Clean up any spills right away. Avoid walking on wet floors. Keep items that you use a lot in easy-to-reach places. If you need to  reach something above you, use a strong step stool that has a grab bar. Keep electrical cords out of the way. Do not use floor polish or wax that makes floors slippery. If you must use wax, use non-skid floor wax. Do not have throw rugs and other things on the floor that can make you trip. What can I do with my stairs? Do not leave any items on the stairs. Make sure that there are handrails on both sides of the stairs and use them. Fix handrails that are broken or loose. Make sure that handrails are as long as the stairways. Check any carpeting to make sure that it is firmly attached to the stairs. Fix any carpet that is loose or worn. Avoid having throw rugs at the top or bottom of the stairs. If you do have throw rugs, attach them to the floor with carpet tape. Make sure that you have a light switch at the top of the stairs and the bottom of the stairs. If you do not have them,  ask someone to add them for you. What else can I do to help prevent falls? Wear shoes that: Do not have high heels. Have rubber bottoms. Are comfortable and fit you well. Are closed at the toe. Do not wear sandals. If you use a stepladder: Make sure that it is fully opened. Do not climb a closed stepladder. Make sure that both sides of the stepladder are locked into place. Ask someone to hold it for you, if possible. Clearly mark and make sure that you can see: Any grab bars or handrails. First and last steps. Where the edge of each step is. Use tools that help you move around (mobility aids) if they are needed. These include: Canes. Walkers. Scooters. Crutches. Turn on the lights when you go into a dark area. Replace any light bulbs as soon as they burn out. Set up your furniture so you have a clear path. Avoid moving your furniture around. If any of your floors are uneven, fix them. If there are any pets around you, be aware of where they are. Review your medicines with your doctor. Some medicines can make you feel dizzy. This can increase your chance of falling. Ask your doctor what other things that you can do to help prevent falls. This information is not intended to replace advice given to you by your health care provider. Make sure you discuss any questions you have with your health care provider. Document Released: 04/02/2009 Document Revised: 11/12/2015 Document Reviewed: 07/11/2014 Elsevier Interactive Patient Education  2017 [employer].

## 2022-08-22 NOTE — Progress Notes (Signed)
I connected with  Jaime [name] on 08/22/2022 at 3:30 p.m. EST by telephone and verified that I am speaking with the correct person using two identifiers.  Location: Patient: Home Provider: [place] Persons participating in the virtual visit: [place]   I discussed the limitations, risks, security and privacy concerns of performing an evaluation and management service by telephone and the availability of in person appointments. The patient expressed understanding and agreed to proceed.  Interactive audio and video telecommunications were attempted between this nurse and patient, however failed, due to patient having technical difficulties OR patient did not have access to video capability.  We continued and completed visit with audio only.  Some vital signs may be absent or patient reported.   Sheral Flow, LPN  Subjective:   Jaime [name] is a 67 y.o. female who presents for an Initial Medicare Annual Wellness Visit.  Review of Systems     Cardiac Risk Factors include: advanced age (>69mn, >>29women);diabetes mellitus;dyslipidemia;family history of premature cardiovascular disease     Objective:    Today's Vitals   08/22/22 1544 08/22/22 1546  Weight: 160 lb (72.6 kg)   Height: 5' 6.5" (1.689 m)   PainSc: 0-No pain 0-No pain   Body mass index is 25.44 kg/m.     08/22/2022    3:47 PM 10/28/2020    1:07 PM 09/17/2020    5:31 PM 09/17/2020    4:24 PM 04/03/2019    6:06 PM 04/11/2017    9:27 AM 04/03/2017   11:35 PM  Advanced Directives  Does Patient Have a Medical Advance Directive? No No No No No No No  Would patient like information on creating a medical advance directive? No - Patient declined  No - Patient declined No - Patient declined Yes (MAU/Ambulatory/Procedural Areas - Information given) No - Patient declined No - Patient declined    Current Medications (verified) Outpatient Encounter Medications as of 08/22/2022  Medication  Sig   amLODipine (NORVASC) 5 MG tablet Take 1 tablet (5 mg total) by mouth daily.   aspirin 325 MG tablet Take 325 mg by mouth daily.    atorvastatin (LIPITOR) 80 MG tablet TAKE 1 TABLET BY MOUTH EVERY DAY   Cholecalciferol (VITAMIN D) 125 MCG (5000 UT) CAPS Take by mouth.   Empagliflozin-metFORMIN HCl (SYNJARDY) 5-500 MG TABS Take 1 tablet by mouth 2 (two) times daily.   gabapentin (NEURONTIN) 100 MG capsule Take 100 mg by mouth 3 (three) times daily as needed.   losartan (COZAAR) 100 MG tablet Take 1 tablet (100 mg total) by mouth daily.   Multiple Vitamins-Minerals (MULTIVITAMINS THER. W/MINERALS) TABS Take 1 tablet by mouth daily as needed (nutrition).   pantoprazole (PROTONIX) 40 MG tablet TAKE 1 TABLET BY MOUTH EVERY DAY IN THE MORNING   No facility-administered encounter medications on file as of 08/22/2022.    Allergies (verified) Patient has no known allergies.   History: Past Medical History:  Diagnosis Date   Anemia    Dyspnea    with exertion    Endometriosis    GERD (gastroesophageal reflux disease)    Hypertension    Meningioma (HCC)    Nontoxic multinodular goiter    Occlusion and stenosis of carotid artery without mention of cerebral infarction    Peptic ulcer disease    PVD [place]peripheral vascular disease) ([place]    Past Surgical History:  Procedure Laterality Date   CT RADIATION THERAPY GUIDE     CT done yearly for menigioma  with radiation if needed   CYST EXCISION     head   CYST EXCISION Left    x 2    cyst removed     left wrist   endometriosis surgery      LAPAROSCOPIC APPENDECTOMY N/A 11/02/2020   Procedure: APPENDECTOMY LAPAROSCOPIC;  Surgeon: Clovis Riley, MD;  Location: [facility];  Service: General;  Laterality: N/A;   ORIF ANKLE FRACTURE     right   OVARIAN CYST REMOVAL     right   SHOULDER SURGERY Right    Spur removal   [place]  2012   right thumb   TUBAL LIGATION     VIDEO BRONCHOSCOPY  11/10/2011    Procedure: VIDEO BRONCHOSCOPY WITH FLUORO;  Surgeon: Tanda Rockers, MD;  Location: [facility];  Service: Cardiopulmonary;  Laterality: Bilateral;   Family History  Problem Relation Age of Onset   Hypertension Mother    Diabetes Mother    Heart disease Mother    Heart failure Mother    Dementia Mother    Lung cancer Mother    Esophageal cancer Father        was a smoker   Colon cancer Maternal Grandmother    Breast cancer Neg Hx    Coronary artery disease Neg Hx    Social History   Socioeconomic History   Marital status: Married    Spouse name: Not on file   Number of children: 1   Years of education: Not on file   Highest education level: Not on file  Occupational History   Occupation: Geophysical data processor     Employer: [employer]: Retired  Tobacco Use   Smoking status: Former    Packs/day: 0.30    Years: 3.00    Total pack years: 0.90    Types: Cigarettes    Quit date: 06/20/1981    Years since quitting: 41.2   Smokeless tobacco: Never  Vaping Use   Vaping Use: Never used  Substance and Sexual Activity   Alcohol use: No   Drug use: No   Sexual activity: Not Currently    Birth control/protection: Post-menopausal  Other Topics Concern   Not on file  Social History Narrative   HSG-[employer], 2 years at [employer]  Married 1989 - marriage is ok.  Daughter - 1979, 2 step-sons; 2 grand-children. Retired - from [employer] entry, got "out-sourced." She does keep her grandchildren.             Social Determinants of Health   Financial Resource Strain: Not on file  Food Insecurity: Not on file  Transportation Needs: Not on file  Physical Activity: Not on file  Stress: Not on file  Social Connections: Not on file    Tobacco Counseling Counseling given: Not Answered   Clinical Intake:  Pre-visit preparation completed: Yes  Pain : No/denies pain Pain Score: 0-No pain     BMI - recorded: 25.44 Nutritional Status: BMI 25 -29  Overweight Nutritional Risks: None Diabetes: No  How often do you need to have someone help you when you read instructions, pamphlets, or other written materials from your doctor or pharmacy?: 1 - Never What is the last grade level you completed in school?: Computer Operator  Nutrition Risk Assessment:  Has the patient had any N/V/D within the last 2 months?  No  Does the patient have any non-healing wounds?  No  Has the patient had any  unintentional weight loss or weight gain?  No   Diabetes:  Is the patient diabetic?  Yes  If diabetic, was a CBG obtained today?  No  Did the patient bring in their glucometer from home?  No  How often do you monitor your CBG's? no.   Financial Strains and Diabetes Management:  Are you having any financial strains with the device, your supplies or your medication? No .  Does the patient want to be seen by Chronic Care Management for management of their diabetes?  No  Would the patient like to be referred to a Nutritionist or for Diabetic Management?  No   Diabetic Exams:  Diabetic Eye Exam: Completed 04/26/2022 Diabetic Foot Exam: Completed 11/10/2021   Interpreter Needed?: No  Information entered by :: Lisette Abu, LPN.   Activities of Daily Living    08/22/2022    3:49 PM 08/21/2022   10:43 PM  In your present state of health, do you have any difficulty performing the following activities:  Hearing? 0 0  Vision? 0 0  Difficulty concentrating or making decisions? 0 0  Walking or climbing stairs? 0 0  Dressing or bathing? 0 0  Doing errands, shopping? 0 0  Preparing Food and eating ? N N  Using the Toilet? N N  In the past six months, have you accidently leaked urine? N N  Do you have problems with loss of bowel control? N N  Managing your Medications? N N  Managing your Finances? N N  Housekeeping or managing your Housekeeping? N N    Patient Care Team: Janith Lima, MD as PCP - General (Internal Medicine) Freada Bergeron, MD as PCP - Cardiology (Cardiology) Ena Dawley, MD (Obstetrics and Gynecology) Elliot Gault, MD as Referring Physician (Neurosurgery) Melrose Nakayama, MD (Orthopedic Surgery) Renato Shin, MD (Inactive) (Endocrinology) Inda Castle, MD (Inactive) (Gastroenterology) [facility]  Indicate any recent Medical Services you may have received from other than [facility] providers in the past year (date may be approximate).     Assessment:   This is a routine wellness examination for Jaime [name].  Hearing/Vision screen No results found.  Dietary issues and exercise activities discussed: Current Exercise Habits: Home exercise routine, Type of exercise: walking, Time (Minutes): 30, Frequency (Times/Week): 7, Weekly Exercise (Minutes/Week): 210, Intensity: Moderate, Exercise limited by: None identified   Goals Addressed   None   Depression Screen    08/22/2022    3:48 PM 11/10/2021    1:55 PM 07/27/2020   12:59 PM 04/03/2019    6:05 PM 01/29/2019    3:17 PM  PHQ 2/9 Scores  PHQ - 2 Score 0 0 0 0 0  PHQ- 9 Score   0  2    Fall Risk    08/22/2022    3:48 PM 08/21/2022   10:43 PM 11/10/2021    1:55 PM 04/03/2019    6:05 PM  [place] in the past year? 0 0 0 1  Number falls in past yr: 0 0    Injury with Fall? 0 0  0  Risk for fall due to : No Fall Risks     Follow up Falls prevention discussed       [place]:  Any stairs in or around the home? No  If so, are there any without handrails? No  Home free of loose throw rugs in walkways, pet beds, electrical cords, etc? Yes  Adequate lighting in your home to reduce risk of falls? Yes   ASSISTIVE DEVICES UTILIZED TO PREVENT FALLS:  Life alert? No  Use of a cane, walker or w/c? No  Grab bars in the bathroom? No  Shower chair or bench in shower? No  Elevated toilet seat or a handicapped toilet? Yes   TIMED UP AND GO:  Was the test performed? No . Phone  Visit  Cognitive Function:        08/22/2022    4:03 PM  6CIT Screen  What Year? 0 points  What month? 0 points  What time? 0 points  Count back from 20 0 points  Months in reverse 0 points  Repeat phrase 0 points  Total Score 0 points    Immunizations Immunization History  Administered Date(s) Administered   COVID-19, mRNA, vaccine(Comirnaty)12 years and older 03/16/2022   Influenza, High Dose Seasonal PF 03/16/2022   Influenza,inj,Quad PF,6+ Mos 04/08/2013, 03/14/2014, 03/26/2015, 06/30/2016, 05/02/2017, 02/22/2019, 03/02/2020   Influenza-Unspecified 04/07/2021   PFIZER(Purple Top)SARS-COV-2 Vaccination 08/13/2019, 08/24/2019, 03/16/2020   PNEUMOCOCCAL CONJUGATE-20 06/08/2021   Pneumococcal Polysaccharide-23 03/02/2020   Td 03/21/2005   Tdap 03/26/2015, 04/04/2017   Zoster Recombinat (Shingrix) 11/23/2017, 01/25/2018    TDAP status: Up to date  Flu Vaccine status: Up to date  Pneumococcal vaccine status: Up to date  Covid-19 vaccine status: Completed vaccines  Qualifies for Shingles Vaccine? Yes   Zostavax completed No   Shingrix Completed?: Yes  Screening Tests Health Maintenance  Topic Date Due   DEXA SCAN  Never done   COVID-19 Vaccine (5 - 2023-24 season) 05/11/2022   Diabetic kidney evaluation - Urine ACR  06/08/2022   FOOT EXAM  11/11/2022   HEMOGLOBIN A1C  11/14/2022   COLONOSCOPY (Pts 45-5yr Insurance coverage will need to be confirmed)  04/03/2023   OPHTHALMOLOGY EXAM  04/27/2023   Diabetic kidney evaluation - eGFR measurement  05/17/2023   PAP SMEAR-Modifier  07/03/2023   Medicare Annual Wellness (AWV)  08/22/2023   MAMMOGRAM  09/22/2023   DTaP/Tdap/Td (4 - Td or Tdap) 04/05/2027   Pneumonia Vaccine 66 Years old  Completed   INFLUENZA VACCINE  Completed   Hepatitis C Screening  Completed   HIV Screening  Completed   Zoster Vaccines- Shingrix  Completed   HPV VACCINES  Aged Out    Health Maintenance  Health Maintenance Due  Topic Date  Due   DEXA SCAN  Never done   COVID-19 Vaccine (5 - 2023-24 season) 05/11/2022   Diabetic kidney evaluation - Urine ACR  06/08/2022    Colorectal cancer screening: Referral to GI placed 08/22/2022. Pt aware the office will call re: appt.  Mammogram status: Completed 09/21/2021. Repeat every year  Bone Density status: never done  Lung Cancer Screening: (Low Dose CT Chest recommended if Age 66-80years, 30 pack-year currently smoking OR have quit w/in 15years.) does not qualify.   Lung Cancer Screening Referral: no  Additional Screening:  Hepatitis C Screening: does qualify; Completed yes  Vision Screening: Recommended annual ophthalmology exams for early detection of glaucoma and other disorders of the eye. Is the patient up to date with their annual eye exam?  Yes  Who is the provider or what is the name of the [facility] the patient [facility] If pt is not established with a provider, would they like to be referred to a provider to establish care? No .   Dental Screening: Recommended annual dental exams for proper  oral hygiene  Community Resource Referral / Chronic Care Management: CRR required this visit?  No   CCM required this visit?  No      Plan:     I have personally reviewed and noted the following in the patient's chart:   Medical and social history Use of alcohol, tobacco or illicit drugs  Current medications and supplements including opioid prescriptions. Patient is not currently taking opioid prescriptions. Functional ability and status Nutritional status Physical activity Advanced directives List of other physicians Hospitalizations, surgeries, and ER visits in previous 12 months Vitals Screenings to include cognitive, depression, and falls Referrals and appointments  In addition, I have reviewed and discussed with patient certain preventive protocols, quality metrics, and best practice recommendations. A written  personalized care plan for preventive services as well as general preventive health recommendations were provided to patient.     Sheral Flow, LPN   [id number]   Nurse Notes:  Normal cognitive status assessed by direct observation by this Nurse Health Advisor. No abnormalities found.   Patient Medicare AWV questionnaire was completed by the patient on 08/21/2022; I have confirmed that all information answered by patient is correct and no changes since this date.

## 2022-10-14 ENCOUNTER — Other Ambulatory Visit: Payer: Self-pay | Admitting: Internal Medicine

## 2022-10-14 DIAGNOSIS — K219 Gastro-esophageal reflux disease without esophagitis: Secondary | ICD-10-CM

## 2022-10-24 ENCOUNTER — Other Ambulatory Visit: Payer: Self-pay | Admitting: Internal Medicine

## 2022-10-24 DIAGNOSIS — Z1231 Encounter for screening mammogram for malignant neoplasm of breast: Secondary | ICD-10-CM

## 2022-10-25 ENCOUNTER — Ambulatory Visit
Admission: RE | Admit: 2022-10-25 | Discharge: 2022-10-25 | Disposition: A | Discharge status: Home / Self Care | Payer: Medicare Other | Source: Ambulatory Visit | Attending: Internal Medicine | Admitting: Internal Medicine

## 2022-10-25 ENCOUNTER — Telehealth: Payer: Self-pay

## 2022-10-25 DIAGNOSIS — Z1231 Encounter for screening mammogram for malignant neoplasm of breast: Secondary | ICD-10-CM

## 2022-10-25 NOTE — Progress Notes (Signed)
   10/25/2022  Patient ID: Jaime [name], female   DOB: 03/12/1956, 67 y.o.   MRN: [id number]  Subjective/Objective: Telephone outreach to review medications and discuss adherence after patient appeared on quality report from insurance identifying the following failed metrics:  adherence to oral diabetes medications and adherence to hypertension and hyperlipidemia medications  Medication Adherence -Reviewed and updated medication list based on what patient is currently taking -Patient endorses good adherence to medication regimen with no barriers in regard to access or affordability   DM -A1c 05/16/22 was 6.7 -Currently taking SynJardy 5-500mg  BID and tolerating well -She does not check BG at home -Applied for PAP previously but did not qualify based on income  HTN -Last clinic BP 116/76 -Currently taking amlodipine 5mg  daily  -Losartan 100mg  daily was on active med list, but this had not been filled and she endorses no longer taking this medication -Endorses checking BP at home on occasion, but she was driving and unable to provide value(s)  HLD -Lipids 05/16/22 all WNL, LDL 62 -Current taking atorvastatin 80mg  daily   Assessment/Plan:  Medication Adherence -Current strategy sufficient -Educated patient to contact me if needs arise around access/affordability of medications, or if income changes next year that may qualify her for Synjardy PAP.  Sending my direct number in MyChart.  DM -Controlled; continue current regimen and regular follow-up -Recommend A1c at upcoming PCP appointment  HTN -Do not see notes indicating losartan stopped by provider -If BP >130/80 at upcoming PCP appointment, may want to restart losartan; but a 100mg  dose may not be necessary.  Medication also provides renal protection in setting of diabetes diagnosis.  HLD -Currently controlled -Continue current regimen and regular follow-up  Follow-up: None scheduled but can follow-up as needed per  PCP  Lenna Gilford, PharmD, DPLA

## 2022-11-16 ENCOUNTER — Ambulatory Visit: Payer: Medicare Other | Admitting: Internal Medicine

## 2022-11-16 ENCOUNTER — Encounter: Payer: Self-pay | Admitting: Internal Medicine

## 2022-11-16 ENCOUNTER — Ambulatory Visit (INDEPENDENT_AMBULATORY_CARE_PROVIDER_SITE_OTHER): Payer: Medicare Other | Admitting: Internal Medicine

## 2022-11-16 VITALS — BP 124/82 | HR 71 | Temp 98.9°F | Resp 16 | Ht 66.5 in | Wt 164.0 lb

## 2022-11-16 DIAGNOSIS — J00 Acute nasopharyngitis [common cold]: Secondary | ICD-10-CM | POA: Diagnosis not present

## 2022-11-16 DIAGNOSIS — E118 Type 2 diabetes mellitus with unspecified complications: Secondary | ICD-10-CM

## 2022-11-16 DIAGNOSIS — E785 Hyperlipidemia, unspecified: Secondary | ICD-10-CM

## 2022-11-16 DIAGNOSIS — I10 Essential (primary) hypertension: Secondary | ICD-10-CM | POA: Diagnosis not present

## 2022-11-16 DIAGNOSIS — D649 Anemia, unspecified: Secondary | ICD-10-CM

## 2022-11-16 LAB — URINALYSIS, ROUTINE W REFLEX MICROSCOPIC
Bilirubin Urine: NEGATIVE
Hgb urine dipstick: NEGATIVE
Ketones, ur: NEGATIVE
Leukocytes,Ua: NEGATIVE
Nitrite: NEGATIVE
Specific Gravity, Urine: 1.02 (ref 1.000–1.030)
Total Protein, Urine: NEGATIVE
Urine Glucose: 250 — AB
Urobilinogen, UA: 0.2 (ref 0.0–1.0)
pH: 6 (ref 5.0–8.0)

## 2022-11-16 LAB — CBC WITH DIFFERENTIAL/PLATELET
Basophils Absolute: 0.1 10*3/uL (ref 0.0–0.1)
Basophils Relative: 0.7 % (ref 0.0–3.0)
Eosinophils Absolute: 0.5 10*3/uL (ref 0.0–0.7)
Eosinophils Relative: 5.1 % — ABNORMAL HIGH (ref 0.0–5.0)
HCT: 33.3 % — ABNORMAL LOW (ref 36.0–46.0)
Hemoglobin: 10.9 g/dL — ABNORMAL LOW (ref 12.0–15.0)
Lymphocytes Relative: 15.6 % (ref 12.0–46.0)
Lymphs Abs: 1.6 10*3/uL (ref 0.7–4.0)
MCHC: 32.7 g/dL (ref 30.0–36.0)
MCV: 82.6 fl (ref 78.0–100.0)
Monocytes Absolute: 1 10*3/uL (ref 0.1–1.0)
Monocytes Relative: 10.3 % (ref 3.0–12.0)
Neutro Abs: 6.9 10*3/uL (ref 1.4–7.7)
Neutrophils Relative %: 68.3 % (ref 43.0–77.0)
Platelets: 272 10*3/uL (ref 150.0–400.0)
RBC: 4.03 Mil/uL (ref 3.87–5.11)
RDW: 17 % — ABNORMAL HIGH (ref 11.5–15.5)
WBC: 10.2 10*3/uL (ref 4.0–10.5)

## 2022-11-16 LAB — BASIC METABOLIC PANEL
BUN: 9 mg/dL (ref 6–23)
CO2: 28 mEq/L (ref 19–32)
Calcium: 9 mg/dL (ref 8.4–10.5)
Chloride: 104 mEq/L (ref 96–112)
Creatinine, Ser: 0.77 mg/dL (ref 0.40–1.20)
GFR: 80.5 mL/min (ref >60.00)
Glucose, Bld: 88 mg/dL (ref 70–99)
Potassium: 3.5 mEq/L (ref 3.5–5.1)
Sodium: 142 mEq/L (ref 135–145)

## 2022-11-16 LAB — POC COVID19 BINAXNOW: SARS Coronavirus 2 Ag: NEGATIVE

## 2022-11-16 LAB — MICROALBUMIN / CREATININE URINE RATIO
Creatinine,U: 108.5 mg/dL
Microalb Creat Ratio: 1 mg/g (ref 0.0–30.0)
Microalb, Ur: 1.1 mg/dL (ref 0.0–1.9)

## 2022-11-16 LAB — HEPATIC FUNCTION PANEL
ALT: 10 U/L (ref 0–35)
AST: 19 U/L (ref 0–37)
Albumin: 4 g/dL (ref 3.5–5.2)
Alkaline Phosphatase: 103 U/L (ref 39–117)
Bilirubin, Direct: 0.2 mg/dL (ref 0.0–0.3)
Total Bilirubin: 0.8 mg/dL (ref 0.2–1.2)
Total Protein: 7.3 g/dL (ref 6.0–8.3)

## 2022-11-16 LAB — LIPID PANEL
Cholesterol: 108 mg/dL (ref 0–200)
HDL: 44.5 mg/dL (ref >39.00)
LDL Cholesterol: 54 mg/dL (ref 0–99)
NonHDL: 63.99
Total CHOL/HDL Ratio: 2
Triglycerides: 49 mg/dL (ref 0.0–149.0)
VLDL: 9.8 mg/dL (ref 0.0–40.0)

## 2022-11-16 LAB — HEMOGLOBIN A1C: Hgb A1c MFr Bld: 6.4 % (ref 4.6–6.5)

## 2022-11-16 NOTE — Patient Instructions (Signed)

## 2022-11-16 NOTE — Progress Notes (Unsigned)
Subjective:  Patient ID: Jaime [name], female    DOB: 03-Dec-1955  Age: 67 y.o. MRN: [id number]  CC: URI   HPI Jaime [name] presents for f/up - She complains of 2 a day hx of chills, scratchy throat, np cough, and yellow nasal phlegm.     Outpatient Medications Prior to Visit  Medication Sig Dispense Refill   amLODipine (NORVASC) 5 MG tablet Take 1 tablet (5 mg total) by mouth daily. 90 tablet 3   aspirin 325 MG tablet Take 325 mg by mouth daily.      atorvastatin (LIPITOR) 80 MG tablet TAKE 1 TABLET BY MOUTH EVERY DAY 30 tablet 11   Cholecalciferol (VITAMIN D) 125 MCG (5000 UT) CAPS Take by mouth.     Empagliflozin-metFORMIN HCl (SYNJARDY) 5-500 MG TABS Take 1 tablet by mouth 2 (two) times daily. 180 tablet 1   gabapentin (NEURONTIN) 100 MG capsule Take 100 mg by mouth 3 (three) times daily as needed.     Multiple Vitamins-Minerals (MULTIVITAMINS THER. W/MINERALS) TABS Take 1 tablet by mouth daily as needed (nutrition).     pantoprazole (PROTONIX) 40 MG tablet TAKE 1 TABLET BY MOUTH EVERY DAY IN THE MORNING 30 tablet 5   No facility-administered medications prior to visit.    ROS Review of Systems  Constitutional:  Positive for chills. Negative for fever.  HENT:  Positive for congestion, rhinorrhea and sore throat. Negative for facial swelling, nosebleeds, sinus pressure, sinus pain, trouble swallowing and voice change.   Respiratory:  Positive for cough. Negative for shortness of breath and wheezing.   Cardiovascular:  Negative for chest pain.  Gastrointestinal: Negative.   Endocrine: Negative.   Genitourinary: Negative.   Musculoskeletal: Negative.   Allergic/Immunologic: Negative.   Neurological: Negative.   Hematological: Negative.   Psychiatric/Behavioral: Negative.      Objective:  BP 124/82 (BP Location: Left Arm, Patient Position: Sitting, Cuff Size: Large)   Pulse 71   Temp 98.9 F (37.2 C) (Oral)   Resp 16   Ht 5' 6.5" (1.689 m)   Wt 164 lb (74.4  kg)   SpO2 98%   BMI 26.07 kg/m   BP Readings from Last 3 Encounters:  11/16/22 124/82  05/16/22 116/76  01/17/22 104/72    Wt Readings from Last 3 Encounters:  11/16/22 164 lb (74.4 kg)  08/22/22 160 lb (72.6 kg)  05/16/22 167 lb (75.8 kg)    Physical Exam Vitals reviewed.  Constitutional:      General: She is not in acute distress.    Appearance: Normal appearance. She is not toxic-appearing or diaphoretic.  HENT:     Nose: Congestion and rhinorrhea present. Rhinorrhea is clear.     Right Nostril: No epistaxis.     Left Nostril: No epistaxis.     Right Sinus: No maxillary sinus tenderness or frontal sinus tenderness.     Left Sinus: No maxillary sinus tenderness or frontal sinus tenderness.  Cardiovascular:     Rate and Rhythm: Normal rate and regular rhythm.  Pulmonary:     Effort: Pulmonary effort is normal. No respiratory distress.     Breath sounds: Normal breath sounds. No wheezing, rhonchi or rales.  Abdominal:     General: Abdomen is flat.     Tenderness: There is no abdominal tenderness.  Musculoskeletal:        General: Normal range of motion.     Cervical back: Neck supple.  Lymphadenopathy:     Cervical: No cervical adenopathy.  Skin:  General: Skin is warm and dry.  Neurological:     General: No focal deficit present.  Psychiatric:        Mood and Affect: Mood normal.        Behavior: Behavior normal.     Lab Results  Component Value Date   WBC 10.2 11/16/2022   HGB 10.9 (L) 11/16/2022   HCT 33.3 (L) 11/16/2022   PLT 272.0 11/16/2022   GLUCOSE 88 11/16/2022   CHOL 108 11/16/2022   TRIG 49.0 11/16/2022   HDL 44.50 11/16/2022   LDLCALC 54 11/16/2022   ALT 10 11/16/2022   AST 19 11/16/2022   NA 142 11/16/2022   K 3.5 11/16/2022   CL 104 11/16/2022   CREATININE 0.77 11/16/2022   BUN 9 11/16/2022   CO2 28 11/16/2022   TSH 1.27 05/16/2022   HGBA1C 6.4 11/16/2022   MICROALBUR 1.1 11/16/2022    MM 3D SCREENING MAMMOGRAM BILATERAL  BREAST  Result Date: 10/26/2022 CLINICAL DATA:  Screening. EXAM: DIGITAL SCREENING BILATERAL MAMMOGRAM WITH TOMOSYNTHESIS AND CAD TECHNIQUE: Bilateral screening digital craniocaudal and mediolateral oblique mammograms were obtained. Bilateral screening digital breast tomosynthesis was performed. The images were evaluated with computer-aided detection. COMPARISON:  Previous exam(s). ACR Breast Density Category a: The breasts are almost entirely fatty. FINDINGS: There are no findings suspicious for malignancy. IMPRESSION: No mammographic evidence of malignancy. A result letter of this screening mammogram will be mailed directly to the patient. RECOMMENDATION: Screening mammogram in one year. (Code:SM-B-01Y) BI-RADS CATEGORY  1: Negative. Electronically Signed   By: Harmon Pier M.D.   On: 10/26/2022 17:11    Assessment & Plan:   Essential hypertension- Blood pressure is well controlled. -     Basic metabolic panel; Future -     CBC with Differential/Platelet; Future -     Hepatic function panel; Future -     Urinalysis, Routine w reflex microscopic; Future  Hyperlipidemia LDL goal <130- LDL goal achieved. Doing well on the statin  -     Lipid panel; Future -     Hepatic function panel; Future  Normocytic anemia H/H stable. -     CBC with Differential/Platelet; Future  Type II diabetes mellitus with manifestations (HCC) Blood sugar is well controlled. -     Basic metabolic panel; Future -     Hemoglobin A1c; Future -     Urinalysis, Routine w reflex microscopic; Future -     Microalbumin / creatinine urine ratio; Future  Acute nasopharyngitis- Symptom and exam are consistent with Viral URI. Antibiotics are not indicated.  -     POC COVID-19 BinaxNow     Follow-up: Return in about 4 months (around 03/19/2023).  Sanda Linger, MD

## 2022-11-23 ENCOUNTER — Encounter: Payer: Self-pay | Admitting: Internal Medicine

## 2022-11-23 ENCOUNTER — Ambulatory Visit (INDEPENDENT_AMBULATORY_CARE_PROVIDER_SITE_OTHER): Payer: Medicare Other | Admitting: Internal Medicine

## 2022-11-23 VITALS — BP 118/78 | HR 73 | Temp 98.3°F | Ht 66.5 in | Wt 166.0 lb

## 2022-11-23 DIAGNOSIS — J069 Acute upper respiratory infection, unspecified: Secondary | ICD-10-CM | POA: Diagnosis not present

## 2022-11-23 DIAGNOSIS — I10 Essential (primary) hypertension: Secondary | ICD-10-CM | POA: Diagnosis not present

## 2022-11-23 MED ORDER — AMOXICILLIN-POT CLAVULANATE 875-125 MG PO TABS: 1.0000 | ORAL_TABLET | Freq: Two times a day (BID) | ORAL | 0 refills | Status: AC

## 2022-11-23 NOTE — Progress Notes (Signed)
I   Subjective:    Patient ID: Jaime [name], female    DOB: May 25, 1956, 67 y.o.   MRN: [id number]      HPI Jaime [name] is here for  Chief Complaint  Patient presents with   Cough    Seen last week for URI; Cough still not any better (patient told by Dr. Yetta Barre to follow up)    Symptoms started > 10 days ago.  Her symptoms have not gotten any better.  She states chills, nasal congestion, postnasal drip, cough that is sometimes wet sometimes productive, wheezing at night.  She denies any known fever or shortness of breath.  She denies headaches, lightheadedness and sinus pressure.  She has not been taking anything for symptoms.  She has a lot of soreness in her chest wall from coughing and her nose is very tender and has sores from blowing her nose so much.    One week covid test was neg.    Medications and allergies reviewed with patient and updated if appropriate.  Current Outpatient Medications on File Prior to Visit  Medication Sig Dispense Refill   amLODipine (NORVASC) 5 MG tablet Take 1 tablet (5 mg total) by mouth daily. 90 tablet 3   aspirin 325 MG tablet Take 325 mg by mouth daily.      atorvastatin (LIPITOR) 80 MG tablet TAKE 1 TABLET BY MOUTH EVERY DAY 30 tablet 11   Cholecalciferol (VITAMIN D) 125 MCG (5000 UT) CAPS Take by mouth.     Empagliflozin-metFORMIN HCl (SYNJARDY) 5-500 MG TABS Take 1 tablet by mouth 2 (two) times daily. 180 tablet 1   gabapentin (NEURONTIN) 100 MG capsule Take 100 mg by mouth 3 (three) times daily as needed.     Multiple Vitamins-Minerals (MULTIVITAMINS THER. W/MINERALS) TABS Take 1 tablet by mouth daily as needed (nutrition).     pantoprazole (PROTONIX) 40 MG tablet TAKE 1 TABLET BY MOUTH EVERY DAY IN THE MORNING 30 tablet 5   No current facility-administered medications on file prior to visit.    Review of Systems  Constitutional:  Positive for chills. Negative for fever.  HENT:  Positive for congestion, postnasal drip and voice change.  Negative for ear pain, sinus pressure, sinus pain and sore throat.   Respiratory:  Positive for cough (dry, wet) and wheezing (occ at night). Negative for shortness of breath.   Gastrointestinal:  Negative for diarrhea and nausea.  Musculoskeletal:  Negative for myalgias.  Neurological:  Negative for light-headedness and headaches.       Objective:   Vitals:   11/23/22 1501  BP: 118/78  Pulse: 73  Temp: 98.3 F (36.8 C)  SpO2: 96%   BP Readings from Last 3 Encounters:  11/23/22 118/78  11/16/22 124/82  05/16/22 116/76   Wt Readings from Last 3 Encounters:  11/23/22 166 lb (75.3 kg)  11/16/22 164 lb (74.4 kg)  08/22/22 160 lb (72.6 kg)   Body mass index is 26.39 kg/m.    Physical Exam Constitutional:      General: She is not in acute distress.    Appearance: Normal appearance. She is not ill-appearing.  HENT:     Head: Normocephalic and atraumatic.     Right Ear: Tympanic membrane, ear canal and external ear normal.     Left Ear: Tympanic membrane, ear canal and external ear normal.     Nose: Congestion present.     Mouth/Throat:     Mouth: Mucous membranes are moist.     Pharynx: No  oropharyngeal exudate or posterior oropharyngeal erythema.  Eyes:     Conjunctiva/sclera: Conjunctivae normal.  Cardiovascular:     Rate and Rhythm: Normal rate and regular rhythm.  Pulmonary:     Effort: Pulmonary effort is normal. No respiratory distress.     Breath sounds: Normal breath sounds. No wheezing or rales.  Musculoskeletal:     Cervical back: Neck supple. No tenderness.  Lymphadenopathy:     Cervical: No cervical adenopathy.  Skin:    General: Skin is warm and dry.     Findings: Erythema (Irritation in the nose repetitive blowing) present.  Neurological:     Mental Status: She is alert.            Assessment & Plan:    URI: Acute Started almost 2 weeks ago without improvement Concern for bacterial infection Last week COVID test was negative Concern  for bacterial cause Start Augmentin 875-125 mg twice daily x 7 days Discussed over-the-counter medication she can take for symptom relief Rest, fluids Call if no improvement  Hypertension: Chronic Well-controlled despite current illness No change in medication-continue amlodipine 5 mg daily

## 2022-11-23 NOTE — Patient Instructions (Addendum)
        Medications changes include :  Augmentin twice daily x 1 week       Return if symptoms worsen or fail to improve.

## 2022-12-19 ENCOUNTER — Ambulatory Visit: Payer: Medicare Other | Admitting: Family Medicine

## 2022-12-19 ENCOUNTER — Encounter: Payer: Self-pay | Admitting: Family Medicine

## 2022-12-19 VITALS — BP 116/78 | HR 70 | Temp 98.2°F | Resp 20 | Ht 66.5 in | Wt 163.0 lb

## 2022-12-19 DIAGNOSIS — J302 Other seasonal allergic rhinitis: Secondary | ICD-10-CM | POA: Diagnosis not present

## 2022-12-19 DIAGNOSIS — H6122 Impacted cerumen, left ear: Secondary | ICD-10-CM

## 2022-12-19 MED ORDER — LEVOCETIRIZINE DIHYDROCHLORIDE 5 MG PO TABS
5.0000 mg | ORAL_TABLET | Freq: Every evening | ORAL | 1 refills | Status: AC
Start: 2022-12-19 — End: ?

## 2022-12-19 MED ORDER — FLUTICASONE PROPIONATE 50 MCG/ACT NA SUSP
2.0000 | Freq: Every day | NASAL | 0 refills | Status: AC
Start: 2022-12-19 — End: ?

## 2022-12-19 NOTE — Progress Notes (Signed)
Assessment & Plan:  1. Seasonal allergies - fluticasone (FLONASE) 50 MCG/ACT nasal spray; Place 2 sprays into both nostrils daily.  Dispense: 16 g; Refill: 0 - levocetirizine (XYZAL) 5 MG tablet; Take 1 tablet (5 mg total) by mouth every evening.  Dispense: 30 tablet; Refill: 1  2. Impacted cerumen of left ear Encouraged to purchase Debrox wax removal kit over the counter. Drops (3-4) should be instilled twice daily x3 days, then the ear flushed with warm water on the 4th day.    No results found for any visits on 12/19/22.  Follow up plan: Return if symptoms worsen or fail to improve.  Deliah Boston, MSN, APRN, FNP-C  Subjective:  HPI: Jaime [name] is a 67 y.o. female presenting on 12/19/2022 for Cough (Lingering cough and has a sensation of "something stuck in her throat" /Seen 6/5 and completed Augmentin 875)  Patient complains of cough and a feeling of something stuck in her throat on the left side . She denies head/chest congestion, runny nose, sneezing, postnasal drainage, and wheezing. Onset of symptoms was 1 month ago, gradually improving since that time. She is drinking plenty of fluids. Evaluation to date: seen on 11/23/2022 and treated with Augmentin BID x7 days; previous COVID test negative. Treatment to date: none.  She does not smoke.    ROS: Negative unless specifically indicated above in HPI.   Relevant past medical history reviewed and updated as indicated.   Allergies and medications reviewed and updated.   Current Outpatient Medications:    amLODipine (NORVASC) 5 MG tablet, Take 1 tablet (5 mg total) by mouth daily., Disp: 90 tablet, Rfl: 3   aspirin 325 MG tablet, Take 325 mg by mouth daily. , Disp: , Rfl:    atorvastatin (LIPITOR) 80 MG tablet, TAKE 1 TABLET BY MOUTH EVERY DAY, Disp: 30 tablet, Rfl: 11   Cholecalciferol (VITAMIN D) 125 MCG (5000 UT) CAPS, Take by mouth., Disp: , Rfl:    Empagliflozin-metFORMIN HCl (SYNJARDY) 5-500 MG TABS, Take 1 tablet by  mouth 2 (two) times daily., Disp: 180 tablet, Rfl: 1   gabapentin (NEURONTIN) 100 MG capsule, Take 100 mg by mouth 3 (three) times daily as needed., Disp: , Rfl:    Multiple Vitamins-Minerals (MULTIVITAMINS THER. W/MINERALS) TABS, Take 1 tablet by mouth daily as needed (nutrition)., Disp: , Rfl:    pantoprazole (PROTONIX) 40 MG tablet, TAKE 1 TABLET BY MOUTH EVERY DAY IN THE MORNING, Disp: 30 tablet, Rfl: 5  No Known Allergies  Objective:   BP 116/78   Pulse 70   Temp 98.2 F (36.8 C)   Resp 20   Ht 5' 6.5" (1.689 m)   Wt 163 lb (73.9 kg)   BMI 25.91 kg/m    Physical Exam Vitals reviewed.  Constitutional:      General: She is not in acute distress.    Appearance: Normal appearance. She is not ill-appearing, toxic-appearing or diaphoretic.  HENT:     Head: Normocephalic and atraumatic.     Right Ear: Tympanic membrane, ear canal and external ear normal. There is no impacted cerumen.     Left Ear: Ear canal and external ear normal. There is impacted cerumen.     Nose: Nose normal. No congestion or rhinorrhea.     Right Turbinates: Enlarged and pale.     Left Turbinates: Enlarged and pale.     Right Sinus: No maxillary sinus tenderness or frontal sinus tenderness.     Left Sinus: No maxillary sinus tenderness or  frontal sinus tenderness.     Mouth/Throat:     Mouth: Mucous membranes are moist.     Pharynx: Oropharynx is clear. No oropharyngeal exudate or posterior oropharyngeal erythema.  Eyes:     General: No scleral icterus.       Right eye: No discharge.        Left eye: No discharge.     Conjunctiva/sclera: Conjunctivae normal.  Cardiovascular:     Rate and Rhythm: Normal rate and regular rhythm.     Heart sounds: Normal heart sounds. No murmur heard.    No friction rub. No gallop.  Pulmonary:     Effort: Pulmonary effort is normal. No respiratory distress.     Breath sounds: Normal breath sounds. No stridor. No wheezing, rhonchi or rales.  Musculoskeletal:         General: Normal range of motion.     Cervical back: Normal range of motion.  Lymphadenopathy:     Cervical: Cervical adenopathy present.     Right cervical: No superficial, deep or posterior cervical adenopathy.    Left cervical: Superficial cervical adenopathy present. No deep or posterior cervical adenopathy.  Skin:    General: Skin is warm and dry.     Capillary Refill: Capillary refill takes less than 2 seconds.  Neurological:     General: No focal deficit present.     Mental Status: She is alert and oriented to person, place, and time. Mental status is at baseline.  Psychiatric:        Mood and Affect: Mood normal.        Behavior: Behavior normal.        Thought Content: Thought content normal.        Judgment: Judgment normal.

## 2022-12-19 NOTE — Patient Instructions (Addendum)
Purchase Debrox wax removal kit over the counter. Drops (3-4) should be instilled twice daily x3 days, then the ear flushed with warm water on the 4th day.  

## 2023-02-01 ENCOUNTER — Other Ambulatory Visit: Payer: Self-pay

## 2023-02-01 MED ORDER — AMLODIPINE BESYLATE 5 MG PO TABS: 5.0000 mg | ORAL_TABLET | Freq: Every day | ORAL | 0 refills | Status: DC

## 2023-02-21 ENCOUNTER — Encounter: Payer: Self-pay | Admitting: Internal Medicine

## 2023-03-02 ENCOUNTER — Other Ambulatory Visit: Payer: Self-pay

## 2023-03-02 MED ORDER — ATORVASTATIN CALCIUM 80 MG PO TABS: 80.0000 mg | ORAL_TABLET | Freq: Every day | ORAL | 0 refills | Status: DC

## 2023-03-05 ENCOUNTER — Other Ambulatory Visit: Payer: Self-pay | Admitting: Physician Assistant

## 2023-03-22 ENCOUNTER — Ambulatory Visit: Payer: Medicare Other | Admitting: Internal Medicine

## 2023-03-22 ENCOUNTER — Ambulatory Visit (AMBULATORY_SURGERY_CENTER): Payer: Medicare Other

## 2023-03-22 ENCOUNTER — Other Ambulatory Visit: Payer: Self-pay

## 2023-03-22 ENCOUNTER — Encounter: Payer: Self-pay | Admitting: Internal Medicine

## 2023-03-22 VITALS — Ht 66.0 in | Wt 162.0 lb

## 2023-03-22 VITALS — BP 116/76 | HR 67 | Temp 98.5°F | Resp 16 | Ht 66.5 in | Wt 162.0 lb

## 2023-03-22 DIAGNOSIS — Z1211 Encounter for screening for malignant neoplasm of colon: Secondary | ICD-10-CM

## 2023-03-22 DIAGNOSIS — G4762 Sleep related leg cramps: Secondary | ICD-10-CM

## 2023-03-22 DIAGNOSIS — I10 Essential (primary) hypertension: Secondary | ICD-10-CM

## 2023-03-22 DIAGNOSIS — E118 Type 2 diabetes mellitus with unspecified complications: Secondary | ICD-10-CM | POA: Diagnosis not present

## 2023-03-22 DIAGNOSIS — Z23 Encounter for immunization: Secondary | ICD-10-CM

## 2023-03-22 DIAGNOSIS — D539 Nutritional anemia, unspecified: Secondary | ICD-10-CM

## 2023-03-22 DIAGNOSIS — D649 Anemia, unspecified: Secondary | ICD-10-CM | POA: Diagnosis not present

## 2023-03-22 DIAGNOSIS — J302 Other seasonal allergic rhinitis: Secondary | ICD-10-CM | POA: Diagnosis not present

## 2023-03-22 DIAGNOSIS — E519 Thiamine deficiency, unspecified: Secondary | ICD-10-CM

## 2023-03-22 LAB — CBC WITH DIFFERENTIAL/PLATELET
Basophils Absolute: 0.1 10*3/uL (ref 0.0–0.1)
Basophils Relative: 0.6 % (ref 0.0–3.0)
Eosinophils Absolute: 0.4 10*3/uL (ref 0.0–0.7)
Eosinophils Relative: 3.9 % (ref 0.0–5.0)
HCT: 35.2 % — ABNORMAL LOW (ref 36.0–46.0)
Hemoglobin: 11.3 g/dL — ABNORMAL LOW (ref 12.0–15.0)
Lymphocytes Relative: 23.1 % (ref 12.0–46.0)
Lymphs Abs: 2.5 10*3/uL (ref 0.7–4.0)
MCHC: 32.2 g/dL (ref 30.0–36.0)
MCV: 83.2 fL (ref 78.0–100.0)
Monocytes Absolute: 0.9 10*3/uL (ref 0.1–1.0)
Monocytes Relative: 8.1 % (ref 3.0–12.0)
Neutro Abs: 6.9 10*3/uL (ref 1.4–7.7)
Neutrophils Relative %: 64.3 % (ref 43.0–77.0)
Platelets: 300 10*3/uL (ref 150.0–400.0)
RBC: 4.23 Mil/uL (ref 3.87–5.11)
RDW: 17.6 % — ABNORMAL HIGH (ref 11.5–15.5)
WBC: 10.8 10*3/uL — ABNORMAL HIGH (ref 4.0–10.5)

## 2023-03-22 LAB — BASIC METABOLIC PANEL
BUN: 12 mg/dL (ref 6–23)
CO2: 28 meq/L (ref 19–32)
Calcium: 9.2 mg/dL (ref 8.4–10.5)
Chloride: 102 meq/L (ref 96–112)
Creatinine, Ser: 0.77 mg/dL (ref 0.40–1.20)
GFR: 80.3 mL/min (ref >60.00)
Glucose, Bld: 83 mg/dL (ref 70–99)
Potassium: 3.9 meq/L (ref 3.5–5.1)
Sodium: 138 meq/L (ref 135–145)

## 2023-03-22 LAB — VITAMIN B12: Vitamin B-12: 319 pg/mL (ref 211–911)

## 2023-03-22 LAB — IBC + FERRITIN
Ferritin: 15.2 ng/mL (ref 10.0–291.0)
Iron: 49 ug/dL (ref 42–145)
Saturation Ratios: 14.1 % — ABNORMAL LOW (ref 20.0–50.0)
TIBC: 347.2 ug/dL (ref 250.0–450.0)
Transferrin: 248 mg/dL (ref 212.0–360.0)

## 2023-03-22 LAB — FOLATE: Folate: 12.3 ng/mL (ref >5.9)

## 2023-03-22 LAB — HEMOGLOBIN A1C: Hgb A1c MFr Bld: 6.5 % (ref 4.6–6.5)

## 2023-03-22 MED ORDER — FLUTICASONE PROPIONATE 50 MCG/ACT NA SUSP: 2.0000 | Freq: Every day | NASAL | 1 refills | Status: DC

## 2023-03-22 MED ORDER — LEVOCETIRIZINE DIHYDROCHLORIDE 5 MG PO TABS
5.0000 mg | ORAL_TABLET | Freq: Every evening | ORAL | 1 refills | Status: DC
Start: 2023-03-22 — End: 2023-10-02

## 2023-03-22 MED ORDER — NA SULFATE-K SULFATE-MG SULF 17.5-3.13-1.6 GM/177ML PO SOLN
1.0000 | Freq: Once | ORAL | 0 refills | Status: AC
Start: 2023-03-22 — End: 2023-03-22

## 2023-03-22 NOTE — Patient Instructions (Signed)

## 2023-03-22 NOTE — Progress Notes (Signed)
Denies allergies to eggs or soy products. Denies complication of anesthesia or sedation. Denies use of weight loss medication. Denies use of O2.   Emmi instructions given for colonoscopy.  

## 2023-03-22 NOTE — Progress Notes (Signed)
Subjective:  Patient ID: Jaime [name], female    DOB: [birth year]  Age: 67 y.o. MRN: [id number]  CC: Anemia, Hypertension, and Diabetes   HPI Jaime [name] presents for f/up ----  Discussed the use of AI scribe software for clinical note transcription with the patient, who gave verbal consent to proceed.  History of Present Illness   The patient, with a history of anemia and diabetes, reports feeling generally well with the exception of nocturnal calf cramps. These cramps occur only during sleep and are not associated with any daytime walking or activities. The patient denies any symptoms of dizziness, lightheadedness, or anemia-related symptoms such as fatigue or ice cravings. 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  The patient denies any symptoms of hyperglycemia such as excessive thirst or urination, although they do report increased thirst at work due to hot conditions. They have been managing this by drinking water throughout the day.  The patient also reports cold feet, which they attribute to being cold-natured. They have been managing their nocturnal calf cramps with mustard.  The patient has recently started a new medication, which they believe has contributed to weight loss. However, they express concern about the cost of this medication. They report that their blood pressure has been running low.  They have been managing this with an unspecified medication. They also report a history of PND and sneezing, which was managed with a pill and a nasal spray. They express a desire to continue the nasal spray due to ongoing sneezing.       Outpatient Medications Prior to Visit  Medication Sig Dispense Refill   aspirin 325 MG tablet Take 325 mg by mouth daily.      atorvastatin (LIPITOR) 80 MG tablet Take 1 tablet (80 mg total) by mouth daily. 30 tablet 0   Cholecalciferol (VITAMIN D) 125 MCG (5000  UT) CAPS Take by mouth.     Empagliflozin-metFORMIN HCl (SYNJARDY) 5-500 MG TABS Take 1 tablet by mouth 2 (two) times daily. 180 tablet 1   gabapentin (NEURONTIN) 100 MG capsule Take 100 mg by mouth 3 (three) times daily as needed.     Multiple Vitamins-Minerals (MULTIVITAMINS THER. W/MINERALS) TABS Take 1 tablet by mouth daily as needed (nutrition).     pantoprazole (PROTONIX) 40 MG tablet TAKE 1 TABLET BY MOUTH EVERY DAY IN THE MORNING 30 tablet 5   amLODipine (NORVASC) 5 MG tablet TAKE 1 TABLET (5 MG TOTAL) BY MOUTH DAILY. 15 tablet 0   fluticasone (FLONASE) 50 MCG/ACT nasal spray Place 2 sprays into both nostrils daily. 16 g 0   levocetirizine (XYZAL) 5 MG tablet Take 1 tablet (5 mg total) by mouth every evening. 30 tablet 1   No facility-administered medications prior to visit.    ROS Review of Systems  Constitutional:  Negative for appetite change, chills, diaphoresis and fatigue.  HENT: Negative.    Eyes: Negative.   Respiratory: Negative.  Negative for cough, chest tightness, shortness of breath and wheezing.   Cardiovascular:  Negative for chest pain, palpitations and leg swelling.  Gastrointestinal:  Negative for abdominal pain, constipation, diarrhea, nausea and vomiting.  Genitourinary: Negative.  Negative for difficulty urinating.  Musculoskeletal:  Negative for arthralgias, back pain, myalgias and neck pain.  Skin: Negative.   Neurological:  Negative for dizziness and headaches.  Hematological:  Negative for adenopathy. Does not bruise/bleed easily.  Psychiatric/Behavioral: Negative.  Objective:  BP 116/76 (BP Location: Left Arm, Patient Position: Sitting, Cuff Size: Large)   Pulse 67   Temp 98.5 F (36.9 C) (Oral)   Resp 16   Ht 5' 6.5" (1.689 m)   Wt 162 lb (73.5 kg)   SpO2 95%   BMI 25.76 kg/m   BP Readings from Last 3 Encounters:  03/22/23 116/76  12/19/22 116/78  11/23/22 118/78    Wt Readings from Last 3 Encounters:  03/22/23 162 lb (73.5 kg)   03/22/23 162 lb (73.5 kg)  12/19/22 163 lb (73.9 kg)    Physical Exam Vitals reviewed.  Constitutional:      Appearance: Normal appearance.  HENT:     Mouth/Throat:     Mouth: Mucous membranes are moist.  Eyes:     General: No scleral icterus.    Conjunctiva/sclera: Conjunctivae normal.  Cardiovascular:     Rate and Rhythm: Normal rate and regular rhythm.     Heart sounds: Normal heart sounds, S1 normal and S2 normal.     No friction rub. No gallop.     Comments: EKG- NSR, 67 bpm NS T wave changes No LVH or Q waves Unchanged Pulmonary:     Effort: Pulmonary effort is normal.     Breath sounds: No stridor. No wheezing, rhonchi or rales.  Abdominal:     General: Abdomen is flat.     Palpations: There is no mass.     Tenderness: There is no abdominal tenderness. There is no guarding.     Hernia: No hernia is present.  Musculoskeletal:     Cervical back: Neck supple.     Right lower leg: No edema.     Left lower leg: No edema.  Lymphadenopathy:     Cervical: No cervical adenopathy.  Skin:    General: Skin is warm and dry.  Neurological:     General: No focal deficit present.     Mental Status: She is alert. Mental status is at baseline.  Psychiatric:        Mood and Affect: Mood normal.        Behavior: Behavior normal.     Lab Results  Component Value Date   WBC 10.8 (H) 03/22/2023   HGB 11.3 (L) 03/22/2023   HCT 35.2 (L) 03/22/2023   PLT 300.0 03/22/2023   GLUCOSE 83 03/22/2023   CHOL 108 11/16/2022   TRIG 49.0 11/16/2022   HDL 44.50 11/16/2022   LDLCALC 54 11/16/2022   ALT 10 11/16/2022   AST 19 11/16/2022   NA 138 03/22/2023   K 3.9 03/22/2023   CL 102 03/22/2023   CREATININE 0.77 03/22/2023   BUN 12 03/22/2023   CO2 28 03/22/2023   TSH 1.27 05/16/2022   HGBA1C 6.5 03/22/2023   MICROALBUR 1.1 11/16/2022    MM 3D SCREENING MAMMOGRAM BILATERAL BREAST  Result Date: 10/26/2022 CLINICAL DATA:  Screening. EXAM: DIGITAL SCREENING BILATERAL MAMMOGRAM  WITH TOMOSYNTHESIS AND CAD TECHNIQUE: Bilateral screening digital craniocaudal and mediolateral oblique mammograms were obtained. Bilateral screening digital breast tomosynthesis was performed. The images were evaluated with computer-aided detection. COMPARISON:  Previous exam(s). ACR Breast Density Category a: The breasts are almost entirely fatty. FINDINGS: There are no findings suspicious for malignancy. IMPRESSION: No mammographic evidence of malignancy. A result letter of this screening mammogram will be mailed directly to the patient. RECOMMENDATION: Screening mammogram in one year. (Code:SM-B-01Y) BI-RADS CATEGORY  1: Negative. Electronically Signed   By: Harmon Pier M.D.   On: 10/26/2022 17:11  Assessment & Plan:   Type II diabetes mellitus with manifestations (HCC) - Her blood sugar is well controlled. -     Basic metabolic panel; Future -     Hemoglobin A1c; Future -     HM Diabetes Foot Exam -     AMB Referral to Pharmacy Medication Management  Normocytic anemia -     CBC with Differential/Platelet; Future -     Reticulocytes; Future -     Vitamin B1; Future -     Zinc; Future -     Vitamin B12; Future -     Folate; Future -     IBC + Ferritin; Future  Essential hypertension- Her BP is well controlled. -     Basic metabolic panel; Future -     CBC with Differential/Platelet; Future -     EKG 12-Lead  Seasonal allergies -     Fluticasone Propionate; Place 2 sprays into both nostrils daily.  Dispense: 48 mL; Refill: 1 -     Levocetirizine Dihydrochloride; Take 1 tablet (5 mg total) by mouth every evening.  Dispense: 90 tablet; Refill: 1  Deficiency anemia- I will treat the thiamine deficiency. -     Reticulocytes; Future -     Vitamin B1; Future -     Zinc; Future -     Vitamin B12; Future -     Folate; Future -     IBC + Ferritin; Future  Flu vaccine need -     Flu Vaccine Trivalent High Dose (Fluad)  Anemia due to acquired thiamine deficiency -     Vitamin B-1;  Take 1 tablet (50 mg total) by mouth daily.  Dispense: 90 tablet; Refill: 1  Nocturnal leg cramps -     Magnesium; Take 1 tablet (250 mg total) by mouth daily.  Dispense: 90 tablet; Refill: 1     Follow-up: Return in about 4 months (around 07/23/2023).  Sanda Linger, MD

## 2023-03-23 ENCOUNTER — Telehealth: Payer: Self-pay

## 2023-03-23 NOTE — Progress Notes (Signed)
   Care Guide Note  03/23/2023 Name: Jaime [name] MRN: [id number] DOB: 1956/03/25  Referred by: [name], MD Reason for referral : Care Coordination (Outreach to schedule with Pharm d )   Jaime [name] is a 67 y.o. year old female who is a primary care patient of [name], MD. Jaime [name] was referred to the pharmacist for assistance related to DM.    Successful contact was made with the patient to discuss pharmacy services including being ready for the pharmacist to call at least 5 minutes before the scheduled appointment time, to have medication bottles and any blood sugar or blood pressure readings ready for review. The patient agreed to meet with the pharmacist via with the pharmacist via telephone visit on (date/time).  03/30/2023  Penne Lash, RMA Care Guide [facility]  [place], [place] [address] Direct Dial: [phone] [email or web address]

## 2023-03-26 DIAGNOSIS — J302 Other seasonal allergic rhinitis: Secondary | ICD-10-CM | POA: Insufficient documentation

## 2023-03-26 DIAGNOSIS — E519 Thiamine deficiency, unspecified: Secondary | ICD-10-CM | POA: Insufficient documentation

## 2023-03-26 DIAGNOSIS — Z23 Encounter for immunization: Secondary | ICD-10-CM | POA: Insufficient documentation

## 2023-03-26 DIAGNOSIS — G4762 Sleep related leg cramps: Secondary | ICD-10-CM | POA: Insufficient documentation

## 2023-03-26 LAB — VITAMIN B1: Vitamin B1 (Thiamine): <6 nmol/L — ABNORMAL LOW (ref 8–30)

## 2023-03-26 MED ORDER — VITAMIN B-1 50 MG PO TABS
50.0000 mg | ORAL_TABLET | Freq: Every day | ORAL | 1 refills | Status: DC
Start: 2023-03-26 — End: 2023-10-02

## 2023-03-26 MED ORDER — MAGNESIUM 250 MG PO TABS
1.0000 | ORAL_TABLET | Freq: Every day | ORAL | 1 refills | Status: DC
Start: 2023-03-26 — End: 2024-04-10

## 2023-03-29 ENCOUNTER — Encounter: Payer: Self-pay | Admitting: Internal Medicine

## 2023-03-30 ENCOUNTER — Other Ambulatory Visit: Payer: Medicare Other | Admitting: Pharmacist

## 2023-03-30 LAB — RETICULOCYTES
ABS Retic: 49920 {cells}/uL (ref 20000–80000)
Retic Ct Pct: 1.2 %

## 2023-03-30 LAB — ZINC: Zinc: 67 ug/dL (ref 60–130)

## 2023-03-30 NOTE — Patient Instructions (Signed)
It was a pleasure speaking with you!  Continue Synjardy 1 tablet twice daily.  I recommend creating a recurring daily alarm to remind yourself to take your evening medications.  I will have the medication assistance start the application for Synjardy. If you don't hear from them, feel free to give me a call at my direct number below.  Arbutus Leas, PharmD, BCPS [facility] [phone]

## 2023-03-30 NOTE — Progress Notes (Signed)
03/30/2023 Name: Jaime [name] MRN: [id number] DOB: Nov 09, 1955  Chief Complaint  Patient presents with   Diabetes   Medication Management    Jaime [name] is a 67 y.o. year old female who presented for a telephone visit.   They were referred to the pharmacist by their PCP for assistance in managing diabetes.    Subjective:  Care Team: Primary Care Provider: [name], MD ; Next Scheduled Visit: 07/24/2023  Medication Access/Adherence  Current Pharmacy:  [id number] - [place],  - [address] AT [facility] [address] [place] [address] Phone: [phone] Fax: [phone]   Patient reports affordability concerns with their medications: Yes  Patient reports access/transportation concerns to their pharmacy: No  Patient reports adherence concerns with their medications:  Yes    She reports Jaime [name] is difficult to afford but has worked very well for her and she wants to continue.   Diabetes:  Current medications: Synjardy XR 5-500 mg twice daily (often misses evening dose) Medications tried in the past:   She notes she lost weight after starting this and her BG improved.   Objective:  Lab Results  Component Value Date   HGBA1C 6.5 03/22/2023    Lab Results  Component Value Date   CREATININE 0.77 03/22/2023   BUN 12 03/22/2023   NA 138 03/22/2023   K 3.9 03/22/2023   CL 102 03/22/2023   CO2 28 03/22/2023    Lab Results  Component Value Date   CHOL 108 11/16/2022   HDL 44.50 11/16/2022   LDLCALC 54 11/16/2022   TRIG 49.0 11/16/2022   CHOLHDL 2 11/16/2022    Medications Reviewed Today     Reviewed by [name], RPH (Pharmacist) on 03/30/23 at 1524  Med List Status: <None>   Medication Order Taking? Sig Documenting Provider Last Dose Status Informant  aspirin 325 MG tablet [id number] Yes Take 325 mg by mouth daily.  [provider] Taking Active Self  atorvastatin  (LIPITOR) 80 MG tablet [id number] Yes Take 1 tablet (80 mg total) by mouth daily. Alver Sorrow, NP Taking Active   Cholecalciferol (VITAMIN D) 125 MCG (5000 UT) CAPS [id number] No Take by mouth.  Patient not taking: Reported on 03/30/2023   [provider] Not Taking Active   Empagliflozin-metFORMIN HCl (SYNJARDY) 5-500 MG TABS [id number] Yes Take 1 tablet by mouth 2 (two) times daily. [name], MD Taking Active   fluticasone [facility]) 50 MCG/ACT nasal spray [id number] Yes Place 2 sprays into both nostrils daily. [name], MD Taking Active   gabapentin (NEURONTIN) 100 MG capsule [id number] Yes Take 100 mg by mouth 3 (three) times daily as needed. [provider] Taking Active            Med Note Littie Deeds, [name]   Tue Oct 25, 2022 10:30 AM) As needed  levocetirizine (XYZAL) 5 MG tablet [id number] No Take 1 tablet (5 mg total) by mouth every evening.  Patient not taking: Reported on 03/30/2023   [name], MD Not Taking Active   Magnesium 250 MG TABS [id number] No Take 1 tablet (250 mg total) by mouth daily.  Patient not taking: Reported on 03/30/2023   [name], MD Not Taking Active   Multiple Vitamins-Minerals (MULTIVITAMINS THER. W/MINERALS) Evelena Asa [id number] Yes Take 1 tablet by mouth daily as needed (nutrition). [provider] Taking Active Self  pantoprazole (PROTONIX) 40 MG tablet [id number] Yes TAKE 1 TABLET  BY MOUTH EVERY DAY IN THE MORNING [name], MD Taking Active   thiamine (VITAMIN B-1) 50 MG tablet [id number] No Take 1 tablet (50 mg total) by mouth daily.  Patient not taking: Reported on 03/30/2023   [name], MD Not Taking Active               Assessment/Plan:   Diabetes: - Currently controlled, A1c goal <7% - Recommend to continue Synjardy BID - encouraged pt to set a recurring alarm on her phone to remind her to take her evening medication  - Meets financial criteria for Synjardy XR patient assistance  program through [facility]. Will collaborate with provider, CPhT, and patient to pursue assistance.     Follow Up Plan: 10/31 telephone f/u  Arbutus Leas, PharmD, BCPS [facility] [phone]

## 2023-04-12 ENCOUNTER — Ambulatory Visit (AMBULATORY_SURGERY_CENTER): Payer: Medicare Other | Admitting: Internal Medicine

## 2023-04-12 ENCOUNTER — Encounter: Payer: Medicare Other | Admitting: Internal Medicine

## 2023-04-12 ENCOUNTER — Encounter: Payer: Self-pay | Admitting: Internal Medicine

## 2023-04-12 VITALS — BP 145/82 | HR 61 | Temp 96.9°F | Resp 10 | Ht 66.25 in | Wt 162.0 lb

## 2023-04-12 DIAGNOSIS — Z1211 Encounter for screening for malignant neoplasm of colon: Secondary | ICD-10-CM | POA: Diagnosis not present

## 2023-04-12 MED ORDER — SODIUM CHLORIDE 0.9 % IV SOLN: 500.0000 mL | Freq: Once | INTRAVENOUS | Status: DC

## 2023-04-12 NOTE — Patient Instructions (Signed)
Resume previous diet & continue present medications.  Repeat colonoscopy in 10 years for screening purposes.  Handout on Diverticulosis given    YOU HAD AN ENDOSCOPIC PROCEDURE TODAY AT THE [facility]:   Refer to the procedure report that was given to you for any specific questions about what was found during the examination.  If the procedure report does not answer your questions, please call your gastroenterologist to clarify.  If you requested that your care partner not be given the details of your procedure findings, then the procedure report has been included in a sealed envelope for you to review at your convenience later.  YOU SHOULD EXPECT: Some feelings of bloating in the abdomen. Passage of more gas than usual.  Walking can help get rid of the air that was put into your GI tract during the procedure and reduce the bloating. If you had a lower endoscopy (such as a colonoscopy or flexible sigmoidoscopy) you may notice spotting of blood in your stool or on the toilet paper. If you underwent a bowel prep for your procedure, you may not have a normal bowel movement for a few days.  Please Note:  You might notice some irritation and congestion in your nose or some drainage.  This is from the oxygen used during your procedure.  There is no need for concern and it should clear up in a day or so.  SYMPTOMS TO REPORT IMMEDIATELY:  Following lower endoscopy (colonoscopy or flexible sigmoidoscopy):  Excessive amounts of blood in the stool  Significant tenderness or worsening of abdominal pains  Swelling of the abdomen that is new, acute  Fever of 100F or higher  For urgent or emergent issues, a gastroenterologist can be reached at any hour by calling [phone]. Do not use MyChart messaging for urgent concerns.    DIET:  We do recommend a small meal at first, but then you may proceed to your regular diet.  Drink plenty of fluids but you should avoid alcoholic beverages for  24 hours.  ACTIVITY:  You should plan to take it easy for the rest of today and you should NOT DRIVE or use heavy machinery until tomorrow (because of the sedation medicines used during the test).    FOLLOW UP: Our staff will call the number listed on your records the next business day following your procedure.  We will call around 7:15- 8:00 am to check on you and address any questions or concerns that you may have regarding the information given to you following your procedure. If we do not reach you, we will leave a message.     If any biopsies were taken you will be contacted by phone or by letter within the next 1-3 weeks.  Please call us at [phone] if you have not heard about the biopsies in 3 weeks.    SIGNATURES/CONFIDENTIALITY: You and/or your care partner have signed paperwork which will be entered into your electronic medical record.  These signatures attest to the fact that that the information above on your After Visit Summary has been reviewed and is understood.  Full responsibility of the confidentiality of this discharge information lies with you and/or your care-partner.

## 2023-04-12 NOTE — Op Note (Signed)
[facility] Patient Name: Jaime [name] Procedure Date: 04/12/2023 1:02 PM MRN: [id number] Endoscopist: Wilhemina Bonito. Marina Goodell , MD, [id number] Age: 67 Referring MD:  Date of Birth: 1955/10/03 Gender: Female Account #: [id number] Procedure:                Colonoscopy Indications:              Screening for colorectal malignant neoplasm. prior                            exam 2014 Medicines:                Monitored Anesthesia Care Procedure:                Pre-Anesthesia Assessment:                           - Prior to the procedure, a History and Physical                            was performed, and patient medications and                            allergies were reviewed. The patient's tolerance of                            previous anesthesia was also reviewed. The risks                            and benefits of the procedure and the sedation                            options and risks were discussed with the patient.                            All questions were answered, and informed consent                            was obtained. Prior Anticoagulants: The patient has                            taken no anticoagulant or antiplatelet agents. ASA                            Grade Assessment: II - A patient with mild systemic                            disease. After reviewing the risks and benefits,                            the patient was deemed in satisfactory condition to                            undergo the procedure.  After obtaining informed consent, the colonoscope                            was passed under direct vision. Throughout the                            procedure, the patient's blood pressure, pulse, and                            oxygen saturations were monitored continuously. The                            CF [id number] #[id number] was introduced through the anus                            and advanced to the the cecum, identified by                             appendiceal orifice and ileocecal valve. The                            ileocecal valve, appendiceal orifice, and rectum                            were photographed. The quality of the bowel                            preparation was excellent. The colonoscopy was                            performed without difficulty. The patient tolerated                            the procedure well. The bowel preparation used was                            SUPREP via split dose instruction. Scope In: 1:26:19 PM Scope Out: 1:37:18 PM Scope Withdrawal Time: 0 hours 7 minutes 54 seconds  Total Procedure Duration: 0 hours 10 minutes 59 seconds  Findings:                 Multiple diverticula were found in the left colon.                           The exam was otherwise without abnormality on                            direct and retroflexion views. Complications:            No immediate complications. Estimated blood loss:                            None. Estimated Blood Loss:     Estimated blood loss: none. Impression:               -  Diverticulosis in the left colon.                           - The examination was otherwise normal on direct                            and retroflexion views.                           - No specimens collected. Recommendation:           - Repeat colonoscopy in 10 years for screening                            purposes.                           - Patient has a contact number available for                            emergencies. The signs and symptoms of potential                            delayed complications were discussed with the                            patient. Return to normal activities tomorrow.                            Written discharge instructions were provided to the                            patient.                           - Resume previous diet.                           - Continue present medications. Wilhemina Bonito. Marina Goodell,  MD 04/12/2023 1:42:27 PM This report has been signed electronically.

## 2023-04-12 NOTE — Progress Notes (Signed)
Sedate, gd SR, tolerated procedure well, VSS, report to RN 

## 2023-04-12 NOTE — Progress Notes (Signed)
HISTORY OF PRESENT ILLNESS:  Jaime [name] is a 67 y.o. female is sent today for routine screening colonoscopy.  Previous examination 2014 with Dr. Arlyce Dice was negative for neoplasia (sigmoid diverticulosis noted).  No complaints.  REVIEW OF SYSTEMS:  All non-GI ROS negative. Past Medical History:  Diagnosis Date   Anemia    Arthritis    Cataract    Diabetes mellitus without complication (HCC)    Dyspnea    with exertion    Endometriosis    GERD (gastroesophageal reflux disease)    Hyperlipidemia    Hypertension    Meningioma (HCC)    Nontoxic multinodular goiter    Occlusion and stenosis of carotid artery without mention of cerebral infarction    Peptic ulcer disease    PVD (peripheral vascular disease) (HCC)     Past Surgical History:  Procedure Laterality Date   CT RADIATION THERAPY GUIDE     CT done yearly for menigioma with radiation if needed   CYST EXCISION     head   CYST EXCISION Left    x 2    cyst removed     left wrist   endometriosis surgery      LAPAROSCOPIC APPENDECTOMY N/A 11/02/2020   Procedure: APPENDECTOMY LAPAROSCOPIC;  Surgeon: Berna Bue, MD;  Location: [facility];  Service: General;  Laterality: N/A;   ORIF ANKLE FRACTURE     right   OVARIAN CYST REMOVAL     right   SHOULDER SURGERY Right    Spur removal   TONSILLECTOMY  1965   TRIGGER FINGER RELEASE  2012   right thumb   TUBAL LIGATION     VIDEO BRONCHOSCOPY  11/10/2011   Procedure: VIDEO BRONCHOSCOPY WITH FLUORO;  Surgeon: Nyoka Cowden, MD;  Location: [facility];  Service: Cardiopulmonary;  Laterality: Bilateral;    Social History Jaime [name]  reports that she quit smoking about 41 years ago. Her smoking use included cigarettes. She started smoking about 44 years ago. She has a 0.9 pack-year smoking history. She has never used smokeless tobacco. She reports that she does not drink alcohol and does not use drugs.  family history includes Colon cancer in her maternal  grandmother; Dementia in her mother; Diabetes in her mother; Esophageal cancer in her father; Heart disease in her mother; Heart failure in her mother; Hypertension in her mother; Lung cancer in her mother.  No Known Allergies     PHYSICAL EXAMINATION: Vital signs: BP 128/69   Pulse 69   Temp (!) 96.9 F (36.1 C)   Resp 19   Ht 5' 6.25" (1.683 m)   Wt 162 lb (73.5 kg)   SpO2 100%   BMI 25.95 kg/m  General: Well-developed, well-nourished, no acute distress HEENT: Sclerae are anicteric, conjunctiva pink. Oral mucosa intact Lungs: Clear Heart: Regular Abdomen: soft, nontender, nondistended, no obvious ascites, no peritoneal signs, normal bowel sounds. No organomegaly. Extremities: No edema Psychiatric: alert and oriented x3. Cooperative     ASSESSMENT:  Colon cancer screening   PLAN:  Screening colonoscopy

## 2023-04-12 NOTE — Progress Notes (Signed)
Pt's states no medical or surgical changes since previsit or office visit. 

## 2023-04-13 ENCOUNTER — Telehealth: Payer: Self-pay

## 2023-04-13 NOTE — Telephone Encounter (Signed)
  Follow up Call-     04/12/2023   12:23 PM  Call back number  Post procedure Call Back phone  # [phone]  Permission to leave phone message Yes     Patient questions:  Do you have a fever, pain , or abdominal swelling? No. Pain Score  0 *  Have you tolerated food without any problems? Yes.    Have you been able to return to your normal activities? Yes.    Do you have any questions about your discharge instructions: Diet   No. Medications  No. Follow up visit  No.  Do you have questions or concerns about your Care? No.  Actions: * If pain score is 4 or above: No action needed, pain <4.

## 2023-04-14 ENCOUNTER — Telehealth: Payer: Self-pay

## 2023-04-14 NOTE — Telephone Encounter (Signed)
-----   Message from [place] sent at 03/30/2023  3:50 PM EDT ----- Please start PAP application for Synjardy XR 5-500 mg 1 tablet twice daily. Pt thinks her income is around $32-34K yearly, household of 1.

## 2023-04-19 NOTE — Telephone Encounter (Signed)
PAP: PAP application for Synjardy, ([employer])) has been mailed to pt's home address on file. Will fax provider portion of application to provider's office when pt's portion is received.      Please be advised

## 2023-04-20 ENCOUNTER — Other Ambulatory Visit: Payer: Medicare Other | Admitting: Pharmacist

## 2023-04-20 NOTE — Progress Notes (Signed)
   04/20/2023 Name: Jaime [name] MRN: [id number] DOB: 05-31-1956  No chief complaint on file.   Jaime [name] is a 67 y.o. year old female who presented for a telephone visit.   They were referred to the pharmacist by their PCP for assistance in managing diabetes.    Subjective:  Care Team: Primary Care Provider: [name], MD ; Next Scheduled Visit: 07/24/2023  Medication Access/Adherence  Current Pharmacy:  [id number] - [place],  - [address] AT [facility] [address] [place] [address] Phone: [phone] Fax: [phone]   Patient reports affordability concerns with their medications: Yes  Patient reports access/transportation concerns to their pharmacy: No  Patient reports adherence concerns with their medications:  Yes    She reports Jaime [name] is difficult to afford but has worked very well for her and she wants to continue.   Diabetes:  Current medications: Synjardy XR 5-500 mg twice daily (often misses evening dose) Medications tried in the past:   She notes she lost weight after starting this and her BG improved.   Objective:  Lab Results  Component Value Date   HGBA1C 6.5 03/22/2023    Lab Results  Component Value Date   CREATININE 0.77 03/22/2023   BUN 12 03/22/2023   NA 138 03/22/2023   K 3.9 03/22/2023   CL 102 03/22/2023   CO2 28 03/22/2023    Lab Results  Component Value Date   CHOL 108 11/16/2022   HDL 44.50 11/16/2022   LDLCALC 54 11/16/2022   TRIG 49.0 11/16/2022   CHOLHDL 2 11/16/2022    Medications Reviewed Today   Medications were not reviewed in this encounter       Assessment/Plan:   Diabetes: - Currently controlled, A1c goal <7% - Recommend to continue Synjardy BID - encouraged pt to set a recurring alarm on her phone to remind her to take her evening medication  - Meets financial criteria for Synjardy XR patient assistance program through [employer]. Will collaborate with provider, CPhT, and patient to pursue assistance.   **Followed up with patient today to see if she has received PAP applicatoin. Appears it was only just mailed out yesterday. She will sign and then drop it off at the front desk.     Follow Up Plan: PRN  Jaime [name], PharmD, BCPS [facility] [phone]

## 2023-05-18 ENCOUNTER — Other Ambulatory Visit: Payer: Self-pay | Admitting: Internal Medicine

## 2023-05-18 DIAGNOSIS — E118 Type 2 diabetes mellitus with unspecified complications: Secondary | ICD-10-CM

## 2023-05-18 DIAGNOSIS — K219 Gastro-esophageal reflux disease without esophagitis: Secondary | ICD-10-CM

## 2023-05-18 DIAGNOSIS — I5189 Other ill-defined heart diseases: Secondary | ICD-10-CM

## 2023-07-24 ENCOUNTER — Ambulatory Visit: Payer: Medicare Other | Admitting: Internal Medicine

## 2023-08-21 ENCOUNTER — Emergency Department (HOSPITAL_COMMUNITY)
Admission: EM | Admit: 2023-08-21 | Discharge: 2023-08-21 | Disposition: A | Discharge status: Home / Self Care | Attending: Emergency Medicine | Admitting: Emergency Medicine

## 2023-08-21 ENCOUNTER — Emergency Department (HOSPITAL_BASED_OUTPATIENT_CLINIC_OR_DEPARTMENT_OTHER)

## 2023-08-21 DIAGNOSIS — M79602 Pain in left arm: Secondary | ICD-10-CM | POA: Insufficient documentation

## 2023-08-21 DIAGNOSIS — M7989 Other specified soft tissue disorders: Secondary | ICD-10-CM | POA: Diagnosis not present

## 2023-08-21 DIAGNOSIS — Z79899 Other long term (current) drug therapy: Secondary | ICD-10-CM | POA: Insufficient documentation

## 2023-08-21 DIAGNOSIS — I10 Essential (primary) hypertension: Secondary | ICD-10-CM | POA: Insufficient documentation

## 2023-08-21 DIAGNOSIS — Z7982 Long term (current) use of aspirin: Secondary | ICD-10-CM | POA: Diagnosis not present

## 2023-08-21 DIAGNOSIS — E119 Type 2 diabetes mellitus without complications: Secondary | ICD-10-CM | POA: Insufficient documentation

## 2023-08-21 LAB — CBC WITH DIFFERENTIAL/PLATELET
Abs Immature Granulocytes: 0.02 10*3/uL (ref 0.00–0.07)
Basophils Absolute: 0.1 10*3/uL (ref 0.0–0.1)
Basophils Relative: 1 %
Eosinophils Absolute: 0.3 10*3/uL (ref 0.0–0.5)
Eosinophils Relative: 4 %
HCT: 36.1 % (ref 36.0–46.0)
Hemoglobin: 10.8 g/dL — ABNORMAL LOW (ref 12.0–15.0)
Immature Granulocytes: 0 %
Lymphocytes Relative: 27 %
Lymphs Abs: 2.2 10*3/uL (ref 0.7–4.0)
MCH: 26 pg (ref 26.0–34.0)
MCHC: 29.9 g/dL — ABNORMAL LOW (ref 30.0–36.0)
MCV: 87 fL (ref 80.0–100.0)
Monocytes Absolute: 0.7 10*3/uL (ref 0.1–1.0)
Monocytes Relative: 8 %
Neutro Abs: 4.8 10*3/uL (ref 1.7–7.7)
Neutrophils Relative %: 60 %
Platelets: 269 10*3/uL (ref 150–400)
RBC: 4.15 MIL/uL (ref 3.87–5.11)
RDW: 16.2 % — ABNORMAL HIGH (ref 11.5–15.5)
WBC: 8.1 10*3/uL (ref 4.0–10.5)
nRBC: 0 % (ref 0.0–0.2)

## 2023-08-21 LAB — BASIC METABOLIC PANEL
Anion gap: 6 (ref 5–15)
BUN: 12 mg/dL (ref 8–23)
CO2: 26 mmol/L (ref 22–32)
Calcium: 8.7 mg/dL — ABNORMAL LOW (ref 8.9–10.3)
Chloride: 106 mmol/L (ref 98–111)
Creatinine, Ser: 0.83 mg/dL (ref 0.44–1.00)
GFR, Estimated: >60 mL/min (ref >60)
Glucose, Bld: 111 mg/dL — ABNORMAL HIGH (ref 70–99)
Potassium: 3.4 mmol/L — ABNORMAL LOW (ref 3.5–5.1)
Sodium: 138 mmol/L (ref 135–145)

## 2023-08-21 NOTE — Discharge Instructions (Signed)
 Your ultrasound do not show any evidence of blood clot.  Your blood work was otherwise reassuring.  Follow-up with your primary care provider.  Continue taking the steroids that you were prescribed.  Apply ice over this area that will also help.  Return if you have any concerning symptoms.

## 2023-08-21 NOTE — ED Provider Notes (Cosign Needed Addendum)
 [place] EMERGENCY DEPARTMENT AT [facility] Provider Note   CSN: [id number] Arrival date & time: 08/21/23  7829     History  Chief Complaint  Patient presents with   Arm Pain    Jaime [name] is a 68 y.o. female.  68 year old female presents today for concern of swelling and pain to the left arm.  She was seen at urgent care had an x-ray done which was reassuring but also had a D-dimer obtained because she was concerned about a blood clot.  This came back somewhat elevated so she was referred to the emergency room to rule out DVT.  The history is provided by the patient. No language interpreter was used.       Home Medications Prior to Admission medications   Medication Sig Start Date End Date Taking? Authorizing Provider  aspirin 325 MG tablet Take 325 mg by mouth daily.     [provider]  atorvastatin (LIPITOR) 80 MG tablet Take 1 tablet (80 mg total) by mouth daily. 03/02/23   Alver Sorrow, NP  Cholecalciferol (VITAMIN D) 125 MCG (5000 UT) CAPS Take by mouth. Patient not taking: Reported on 03/30/2023    [provider]  fluticasone (FLONASE) 50 MCG/ACT nasal spray Place 2 sprays into both nostrils daily. 03/22/23   Jaime Grandchild, MD  gabapentin (NEURONTIN) 100 MG capsule Take 100 mg by mouth 3 (three) times daily as needed.    [provider]  levocetirizine (XYZAL) 5 MG tablet Take 1 tablet (5 mg total) by mouth every evening. Patient not taking: Reported on 03/30/2023 03/22/23   Jaime Grandchild, MD  Magnesium 250 MG TABS Take 1 tablet (250 mg total) by mouth daily. 03/26/23   Jaime Grandchild, MD  Multiple Vitamins-Minerals (MULTIVITAMINS THER. W/MINERALS) TABS Take 1 tablet by mouth daily as needed (nutrition).    [provider]  pantoprazole (PROTONIX) 40 MG tablet TAKE 1 TABLET BY MOUTH EVERY DAY IN THE MORNING 05/18/23   Jaime Grandchild, MD  SYNJARDY 5-500 MG TABS TAKE 1 TABLET BY MOUTH TWICE A DAY 05/18/23    Jaime Grandchild, MD  thiamine (VITAMIN B-1) 50 MG tablet Take 1 tablet (50 mg total) by mouth daily. 03/26/23   Jaime Grandchild, MD      Allergies    Patient has no known allergies.    Review of Systems   Review of Systems  Constitutional:  Negative for chills and fever.  Musculoskeletal:  Positive for myalgias.  All other systems reviewed and are negative.   Physical Exam Updated Vital Signs BP (!) 170/94 (BP Location: Right Arm)   Pulse 69   Temp 98 F (36.7 C) (Oral)   Resp 18   SpO2 100%  Physical Exam Vitals and nursing note reviewed.  Constitutional:      General: She is not in acute distress.    Appearance: Normal appearance. She is not ill-appearing.  HENT:     Head: Normocephalic and atraumatic.     Nose: Nose normal.  Eyes:     Conjunctiva/sclera: Conjunctivae normal.  Cardiovascular:     Rate and Rhythm: Normal rate and regular rhythm.  Pulmonary:     Effort: Pulmonary effort is normal. No respiratory distress.  Musculoskeletal:        General: No deformity. Normal range of motion.     Cervical back: Normal range of motion.     Comments: Left arm just proximal to antecubital fossa has been swelling.  No significant tenderness to palpation.  Neurovascularly intact.  Skin:    Findings: No rash.  Neurological:     Mental Status: She is alert.     ED Results / Procedures / Treatments   Labs (all labs ordered are listed, but only abnormal results are displayed) Labs Reviewed  CBC WITH DIFFERENTIAL/PLATELET - Abnormal; Notable for the following components:      Result Value   Hemoglobin 10.8 (*)    MCHC 29.9 (*)    RDW 16.2 (*)    All other components within normal limits  BASIC METABOLIC PANEL    EKG None  Radiology No results found.  Procedures Procedures    Medications Ordered in ED Medications - No data to display  ED Course/ Medical Decision Making/ A&P                                 Medical Decision Making Amount and/or  Complexity of Data Reviewed Labs: ordered.   Medical Decision Making / ED Course   This patient presents to the ED for concern of left arm swelling, this involves an extensive number of treatment options, and is a complaint that carries with it a high risk of complications and morbidity.  The differential diagnosis includes DVT, superficial venous thrombus, tendinitis, MSK etiology  MDM: 68 year old female presents today for concern of swelling and pain to left arm.  This started yesterday.  She was seen at urgent care and had a D-dimer performed which was 550 just slightly above the upper end of normal.  However when adjusted for age this is within normal.  However patient is still concerned and would like to proceed with the ultrasound.  Basic blood work ordered.  CBC shows hemoglobin of 10.8 which is around her baseline.  BMP of 3.4, glucose was 111 otherwise without acute concern.  DVT study negative.  She is already taken prednisone for her shoulder.  She has an MRI scheduled and has a follow-up scheduled with her PCP for next Monday.  Discussed applying ice.  No concerning findings regarding her symptoms.  She is stable for discharge.  Discharged in stable condition.  Return precaution discussed.  Patient voices understanding and is in agreement with plan.  Additional history obtained: -Additional history obtained from UC note -External records from outside source obtained and reviewed including: Chart review including previous notes, labs, imaging, consultation notes   Lab Tests: -I ordered, reviewed, and interpreted labs.   The pertinent results include:   Labs Reviewed  CBC WITH DIFFERENTIAL/PLATELET - Abnormal; Notable for the following components:      Result Value   Hemoglobin 10.8 (*)    MCHC 29.9 (*)    RDW 16.2 (*)    All other components within normal limits  BASIC METABOLIC PANEL - Abnormal; Notable for the following components:   Potassium 3.4 (*)    Glucose, Bld  111 (*)    Calcium 8.7 (*)    All other components within normal limits      EKG  EKG Interpretation Date/Time:    Ventricular Rate:    PR Interval:    QRS Duration:    QT Interval:    QTC Calculation:   R Axis:      Text Interpretation:           Imaging Studies ordered: I ordered imaging studies including DVT study I independently visualized and interpreted imaging. I agree with  the radiologist interpretation   Medicines ordered and prescription drug management: No orders of the defined types were placed in this encounter.   -I have reviewed the patients home medicines and have made adjustments as needed   Reevaluation: After the interventions noted above, I reevaluated the patient and found that they have :stayed the same  Co morbidities that complicate the patient evaluation  Past Medical History:  Diagnosis Date   Anemia    Arthritis    Cataract    Diabetes mellitus without complication (HCC)    Dyspnea    with exertion    Endometriosis    GERD (gastroesophageal reflux disease)    Hyperlipidemia    Hypertension    Meningioma (HCC)    Nontoxic multinodular goiter    Occlusion and stenosis of carotid artery without mention of cerebral infarction    Peptic ulcer disease    PVD (peripheral vascular disease) (HCC)       Dispostion: Discharge and able condition.  Return precaution discussed.  Patient voices understanding and is in agreement with plan.  Final Clinical Impression(s) / ED Diagnoses Final diagnoses:  Left arm pain    Rx / DC Orders ED Discharge Orders     None         Marita Kansas, PA-C 08/21/23 0933    Marita [place], PA-C 08/21/23 1610    Alvira Monday, MD 08/23/23 1208

## 2023-08-21 NOTE — Progress Notes (Signed)
 Left upper extremity venous duplex has been completed. Preliminary results can be found in CV Proc through chart review.  Results were given to Marita Kansas PA.  08/21/23 8:48 AM Olen Cordial RVT

## 2023-08-21 NOTE — ED Triage Notes (Signed)
 Pt reports left arm swelling x yesterday afternoon, pt went to UC and had a d-dimer drawn, pt reports called this morning and told to come to ED to be worked up for blood clot

## 2023-08-24 ENCOUNTER — Ambulatory Visit: Payer: Medicare Other

## 2023-08-24 ENCOUNTER — Ambulatory Visit: Payer: Medicare Other | Admitting: Internal Medicine

## 2023-08-24 VITALS — Ht 66.0 in | Wt 171.0 lb

## 2023-08-24 DIAGNOSIS — Z0000 Encounter for general adult medical examination without abnormal findings: Secondary | ICD-10-CM

## 2023-08-24 DIAGNOSIS — Z780 Asymptomatic menopausal state: Secondary | ICD-10-CM

## 2023-08-24 NOTE — Patient Instructions (Addendum)
 Jaime [name] , Thank you for taking time to come for your Medicare Wellness Visit. I appreciate your ongoing commitment to your health goals. Please review the following plan we discussed and let me know if I can assist you in the future.   Referrals/Orders/Follow-Ups/Clinician Recommendations: Aim for 30 minutes of exercise or brisk walking, 6-8 glasses of water, and 5 servings of fruits and vegetables each day. Ordered a DEXA scan for 2025. Ophthalmology (eye) exam with Dr Dione Booze due in 2025.  This is a list of the screening recommended for you and due dates:  Health Maintenance  Topic Date Due   DEXA scan (bone density measurement)  Never done   COVID-19 Vaccine (5 - 2024-25 season) 02/19/2023   Eye exam for diabetics  04/27/2023   Hemoglobin A1C  09/20/2023   Yearly kidney health urinalysis for diabetes  11/16/2023   Complete foot exam   03/21/2024   Yearly kidney function blood test for diabetes  08/20/2024   Medicare Annual Wellness Visit  08/23/2024   Mammogram  10/24/2024   DTaP/Tdap/Td vaccine (4 - Td or Tdap) 04/05/2027   Colon Cancer Screening  04/11/2033   Pneumonia Vaccine  Completed   Flu Shot  Completed   Hepatitis C Screening  Completed   Zoster (Shingles) Vaccine  Completed   HPV Vaccine  Aged Out    Advanced directives: (Declined) Advance directive discussed with you today. Even though you declined this today, please call our office should you change your mind, and we can give you the proper paperwork for you to fill out.  Next Medicare Annual Wellness Visit scheduled for next year: Yes - 08/2024

## 2023-08-24 NOTE — Progress Notes (Signed)
 Subjective:   Jaime [name] is a 68 y.o. who presents for a Medicare Wellness preventive visit.  Visit Complete: Virtual I connected with  [name] on 08/24/23 by a audio enabled telemedicine application and verified that I am speaking with the correct person using two identifiers.  Patient Location: Home  Provider Location: Office/Clinic  I discussed the limitations of evaluation and management by telemedicine. The patient expressed understanding and agreed to proceed.  Vital Signs: Because this visit was a virtual/telehealth visit, some criteria may be missing or patient reported. Any vitals not documented were not able to be obtained and vitals that have been documented are patient reported.  VideoDeclined- This patient declined Librarian, academic. Therefore the visit was completed with audio only.  AWV Questionnaire: Yes: Patient Medicare AWV questionnaire was completed by the patient on 08/22/2023; I have confirmed that all information answered by patient is correct and no changes since this date.  Cardiac Risk Factors include: advanced age (>67men, >73 women);diabetes mellitus;dyslipidemia;hypertension     Objective:    Today's Vitals   08/24/23 0857  Weight: 171 lb (77.6 kg)  Height: 5\' 6"  (1.676 m)   Body mass index is 27.6 kg/m.     08/24/2023    8:55 AM 08/21/2023    6:16 AM 08/22/2022    3:47 PM 10/28/2020    1:07 PM 09/17/2020    5:31 PM 09/17/2020    4:24 PM 04/03/2019    6:06 PM  Advanced Directives  Does Patient Have a Medical Advance Directive? No No No No No No No  Would patient like information on creating a medical advance directive? No - Patient declined  No - Patient declined  No - Patient declined No - Patient declined Yes (MAU/Ambulatory/Procedural Areas - Information given)    Current Medications (verified) Outpatient Encounter Medications as of 08/24/2023  Medication Sig   aspirin 325 MG tablet Take 325 mg by mouth  daily.    Cholecalciferol (VITAMIN D) 125 MCG (5000 UT) CAPS Take by mouth.   fluticasone (FLONASE) 50 MCG/ACT nasal spray Place 2 sprays into both nostrils daily.   gabapentin (NEURONTIN) 100 MG capsule Take 100 mg by mouth 3 (three) times daily as needed.   levocetirizine (XYZAL) 5 MG tablet Take 1 tablet (5 mg total) by mouth every evening.   Magnesium 250 MG TABS Take 1 tablet (250 mg total) by mouth daily.   Multiple Vitamins-Minerals (MULTIVITAMINS THER. W/MINERALS) TABS Take 1 tablet by mouth daily as needed (nutrition).   pantoprazole (PROTONIX) 40 MG tablet TAKE 1 TABLET BY MOUTH EVERY DAY IN THE MORNING   SYNJARDY 5-500 MG TABS TAKE 1 TABLET BY MOUTH TWICE A DAY   thiamine (VITAMIN B-1) 50 MG tablet Take 1 tablet (50 mg total) by mouth daily.   [DISCONTINUED] atorvastatin (LIPITOR) 80 MG tablet Take 1 tablet (80 mg total) by mouth daily.   No facility-administered encounter medications on file as of 08/24/2023.    Allergies (verified) Patient has no known allergies.   History: Past Medical History:  Diagnosis Date   Anemia    Arthritis    Cataract    Diabetes mellitus without complication (HCC)    Dyspnea    with exertion    Endometriosis    GERD (gastroesophageal reflux disease)    Hyperlipidemia    Hypertension    Meningioma (HCC)    Nontoxic multinodular goiter    Occlusion and stenosis of carotid artery without mention of cerebral infarction  Peptic ulcer disease    PVD (peripheral vascular disease) (HCC)    Past Surgical History:  Procedure Laterality Date   CT RADIATION THERAPY GUIDE     CT done yearly for menigioma with radiation if needed   CYST EXCISION     head   CYST EXCISION Left    x 2    cyst removed     left wrist   endometriosis surgery      LAPAROSCOPIC APPENDECTOMY N/A 11/02/2020   Procedure: APPENDECTOMY LAPAROSCOPIC;  Surgeon: Berna Bue, MD;  Location: [facility];  Service: General;  Laterality: N/A;   ORIF ANKLE FRACTURE     right    OVARIAN CYST REMOVAL     right   SHOULDER SURGERY Right    Spur removal   TONSILLECTOMY  1965   TRIGGER FINGER RELEASE  2012   right thumb   TUBAL LIGATION     VIDEO BRONCHOSCOPY  11/10/2011   Procedure: VIDEO BRONCHOSCOPY WITH FLUORO;  Surgeon: Nyoka Cowden, MD;  Location: [facility];  Service: Cardiopulmonary;  Laterality: Bilateral;   Family History  Problem Relation Age of Onset   Hypertension Mother    Diabetes Mother    Heart disease Mother    Heart failure Mother    Dementia Mother    Lung cancer Mother    Esophageal cancer Father        was a smoker   Colon cancer Maternal Grandmother    Breast cancer Neg Hx    Coronary artery disease Neg Hx    Rectal cancer Neg Hx    Stomach cancer Neg Hx    Social History   Socioeconomic History   Marital status: Married    Spouse name: Not on file   Number of children: 1   Years of education: Not on file   Highest education level: Associate degree: occupational, Scientist, product/process development, or vocational program  Occupational History   Occupation: Retail buyer     Employer: [employer]    Comment: Retired  Tobacco Use   Smoking status: Former    Current packs/day: 0.00    Average packs/day: 0.3 packs/day for 3.0 years (0.9 ttl pk-yrs)    Types: Cigarettes    Start date: 06/20/1978    Quit date: 06/20/1981    Years since quitting: 42.2    Passive exposure: Past   Smokeless tobacco: Never  Vaping Use   Vaping status: Never Used  Substance and Sexual Activity   Alcohol use: Yes    Alcohol/week: 1.0 standard drink of alcohol    Types: 1 Glasses of wine per week    Comment: occasionally   Drug use: No   Sexual activity: Not Currently    Birth control/protection: Post-menopausal  Other Topics Concern   Not on file  Social History Narrative   HSG-Pge, 2 years at [employer] [facility] [facility] [employer]  Married 1989 - marriage is ok.  Daughter - 1979, 2 step-sons; 2 grand-children. 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             Social Drivers of Corporate investment banker Strain: Low Risk  (08/24/2023)   Overall Financial Resource Strain (CARDIA)    Difficulty of Paying Living Expenses: Not hard at all  Food Insecurity: No Food Insecurity (08/24/2023)   Hunger Vital Sign    Worried About Running Out of Food in the Last Year: Never true    Ran Out of Food in the Last  Year: Never true  Transportation Needs: No Transportation Needs (08/24/2023)   PRAPARE - Administrator, Civil Service (Medical): No    Lack of Transportation (Non-Medical): No  Physical Activity: Sufficiently Active (08/24/2023)   Exercise Vital Sign    Days of Exercise per Week: 3 days    Minutes of Exercise per Session: 120 min  Recent Concern: Physical Activity - Insufficiently Active (08/22/2023)   Exercise Vital Sign    Days of Exercise per Week: 1 day    Minutes of Exercise per Session: 10 min  Stress: No Stress Concern Present (08/24/2023)   [employer] of Occupational Health - Occupational Stress Questionnaire    Feeling of Stress : Not at all  Social Connections: Moderately Integrated (08/24/2023)   Social Connection and Isolation Panel [NHANES]    Frequency of Communication with Friends and Family: More than three times a week    Frequency of Social Gatherings with Friends and Family: More than three times a week    Attends Religious Services: 1 to 4 times per year    Active Member of [employer] or Organizations: No    Attends Engineer, structural: Never    Marital Status: Married    Tobacco Counseling - Former Smoker Counseling given - Yes   Clinical Intake:  Pre-visit preparation completed: Yes  Pain : No/denies pain     BMI - recorded: 27.6 Nutritional Risks: None Diabetes: Yes CBG done?: No Did pt. bring in CBG monitor from home?: No  How often do you need to have someone help you when you read instructions, pamphlets, or other written materials from your doctor or  pharmacy?: 1 - Never  Interpreter Needed?: No  Information entered by :: Hassell Halim, CMA   Activities of Daily Living     08/24/2023    9:03 AM 08/22/2023    5:45 AM  In your present state of health, do you have any difficulty performing the following activities:  Hearing? 0 0  Vision? 0 0  Difficulty concentrating or making decisions? 0 0  Walking or climbing stairs? 0 0  Dressing or bathing? 0 0  Doing errands, shopping? 0 0  Preparing Food and eating ? N N  Using the Toilet? N N  In the past six months, have you accidently leaked urine? N N  Do you have problems with loss of bowel control? N   Managing your Medications? N N  Managing your Finances? N N  Housekeeping or managing your Housekeeping? N N    Patient Care Team: [name], MD as PCP - General (Internal Medicine) Meriam Sprague, MD (Inactive) as PCP - Cardiology (Cardiology) Kirkland Hun, MD (Obstetrics and Gynecology) Angelene Giovanni, MD as Referring Physician (Neurosurgery) Marcene Corning, MD (Orthopedic Surgery) Romero Belling, MD (Inactive) (Endocrinology) Louis Meckel, MD (Inactive) (Gastroenterology) [facility] [name], RPH as Pharmacist (Pharmacist)  Indicate any recent Medical Services you may have received from other than [facility] providers in the past year (date may be approximate).     Assessment:   This is a routine wellness examination for Aolanis.  Hearing/Vision screen Hearing Screening - Comments:: Denies hearing difficulties   Vision Screening - Comments:: Wears rx glasses - up to date with routine eye exams with Dr Dione Booze   Goals Addressed               This Visit's Progress     Patient Stated (pt-stated)  Patient stated she plans to maintain your weight and stay healthy.       Depression Screen     08/24/2023    9:08 AM 12/19/2022    8:24 AM 08/22/2022    3:48 PM 11/10/2021    1:55 PM 07/27/2020   12:59 PM 04/03/2019    6:05  PM 01/29/2019    3:17 PM  PHQ 2/9 Scores  PHQ - 2 Score 0 0 0 0 0 0 0  PHQ- 9 Score 0    0  2    Fall Risk     08/24/2023    9:03 AM 08/22/2023    5:45 AM 12/19/2022    8:24 AM 08/22/2022    3:48 PM 08/21/2022   10:43 PM  Fall Risk   Falls in the past year? 1 1 0 0 0  Number falls in past yr: 0 0 0 0 0  Injury with Fall? 0 0 0 0 0  Risk for fall due to :   No Fall Risks No Fall Risks   Follow up Falls prevention discussed;Falls evaluation completed  Falls evaluation completed Falls prevention discussed     MEDICARE RISK AT HOME:  Medicare Risk at Home Any stairs in or around the home?: Yes If so, are there any without handrails?: No Home free of loose throw rugs in walkways, pet beds, electrical cords, etc?: No Adequate lighting in your home to reduce risk of falls?: Yes Life alert?: No Use of a cane, walker or w/c?: Yes Grab bars in the bathroom?: No Shower chair or bench in shower?: No Elevated toilet seat or a handicapped toilet?: No  TIMED UP AND GO:  Was the test performed?  No  Cognitive Function: 6CIT completed        08/24/2023    9:04 AM 08/22/2022    4:03 PM  6CIT Screen  What Year? 0 points 0 points  What month? 0 points 0 points  What time? 0 points 0 points  Count back from 20 0 points 0 points  Months in reverse 0 points 0 points  Repeat phrase 0 points 0 points  Total Score 0 points 0 points    Immunizations Immunization History  Administered Date(s) Administered   Fluad Trivalent(High Dose 65+) 03/22/2023   Influenza, High Dose Seasonal PF 03/16/2022   Influenza,inj,Quad PF,6+ Mos 04/08/2013, 03/14/2014, 03/26/2015, 06/30/2016, 05/02/2017, 02/22/2019, 03/02/2020   Influenza-Unspecified 04/07/2021   PFIZER(Purple Top)SARS-COV-2 Vaccination 08/13/2019, 08/24/2019, 03/16/2020   PNEUMOCOCCAL CONJUGATE-20 06/08/2021   Pfizer(Comirnaty)Fall Seasonal Vaccine 12 years and older 03/16/2022   Pneumococcal Polysaccharide-23 03/02/2020   Td 03/21/2005   Tdap  03/26/2015, 04/04/2017   Zoster Recombinant(Shingrix) 11/23/2017, 01/25/2018    Screening Tests Health Maintenance  Topic Date Due   DEXA SCAN  Never done   COVID-19 Vaccine (5 - 2024-25 season) 02/19/2023   OPHTHALMOLOGY EXAM  04/27/2023   HEMOGLOBIN A1C  09/20/2023   Diabetic kidney evaluation - Urine ACR  11/16/2023   FOOT EXAM  03/21/2024   Diabetic kidney evaluation - eGFR measurement  08/20/2024   Medicare Annual Wellness (AWV)  08/23/2024   MAMMOGRAM  10/24/2024   DTaP/Tdap/Td (4 - Td or Tdap) 04/05/2027   Colonoscopy  04/11/2033   Pneumonia Vaccine 71+ Years old  Completed   INFLUENZA VACCINE  Completed   Hepatitis C Screening  Completed   Zoster Vaccines- Shingrix  Completed   HPV VACCINES  Aged Out    Health Maintenance  Health Maintenance Due  Topic Date Due  DEXA SCAN  Never done   COVID-19 Vaccine (5 - 2024-25 season) 02/19/2023   OPHTHALMOLOGY EXAM  04/27/2023   Health Maintenance Items Addressed:08/24/2023 DEXA ordered  Additional Screening:  Vision Screening: Recommended annual ophthalmology exams for early detection of glaucoma and other disorders of the eye. Patient will schedule an annual eye exam with Dr Dione Booze.  Dental Screening: Recommended annual dental exams for proper oral hygiene  Community Resource Referral / Chronic Care Management: CRR required this visit?  No   CCM required this visit?  No     Plan:     I have personally reviewed and noted the following in the patient's chart:   Medical and social history Use of alcohol, tobacco or illicit drugs  Current medications and supplements including opioid prescriptions. Patient is not currently taking opioid prescriptions. Functional ability and status Nutritional status Physical activity Advanced directives List of other physicians Hospitalizations, surgeries, and ER visits in previous 12 months Vitals Screenings to include cognitive, depression, and falls Referrals and  appointments  In addition, I have reviewed and discussed with patient certain preventive protocols, quality metrics, and best practice recommendations. A written personalized care plan for preventive services as well as general preventive health recommendations were provided to patient.     Darreld Mclean, CMA   08/24/2023   After Visit Summary: (MyChart) Due to this being a telephonic visit, the after visit summary with patients personalized plan was offered to patient via MyChart   Notes: Please refer to Routing Comments.

## 2023-08-28 ENCOUNTER — Encounter: Payer: Medicare Other | Admitting: Internal Medicine

## 2023-08-28 ENCOUNTER — Ambulatory Visit (INDEPENDENT_AMBULATORY_CARE_PROVIDER_SITE_OTHER)
Admission: RE | Admit: 2023-08-28 | Discharge: 2023-08-28 | Disposition: A | Discharge status: Home / Self Care | Source: Ambulatory Visit | Attending: Internal Medicine | Admitting: Internal Medicine

## 2023-08-28 DIAGNOSIS — Z780 Asymptomatic menopausal state: Secondary | ICD-10-CM

## 2023-08-30 ENCOUNTER — Encounter: Payer: Self-pay | Admitting: Internal Medicine

## 2023-09-29 ENCOUNTER — Other Ambulatory Visit: Payer: Self-pay | Admitting: Family

## 2023-10-02 ENCOUNTER — Encounter: Payer: Self-pay | Admitting: Internal Medicine

## 2023-10-02 ENCOUNTER — Ambulatory Visit (INDEPENDENT_AMBULATORY_CARE_PROVIDER_SITE_OTHER): Admitting: Internal Medicine

## 2023-10-02 VITALS — BP 156/94 | HR 68 | Temp 98.1°F | Resp 16 | Ht 66.0 in | Wt 175.2 lb

## 2023-10-02 DIAGNOSIS — D538 Other specified nutritional anemias: Secondary | ICD-10-CM | POA: Diagnosis not present

## 2023-10-02 DIAGNOSIS — E519 Thiamine deficiency, unspecified: Secondary | ICD-10-CM

## 2023-10-02 DIAGNOSIS — Z0000 Encounter for general adult medical examination without abnormal findings: Secondary | ICD-10-CM

## 2023-10-02 DIAGNOSIS — I10 Essential (primary) hypertension: Secondary | ICD-10-CM

## 2023-10-02 DIAGNOSIS — E876 Hypokalemia: Secondary | ICD-10-CM

## 2023-10-02 DIAGNOSIS — E785 Hyperlipidemia, unspecified: Secondary | ICD-10-CM

## 2023-10-02 DIAGNOSIS — Z0001 Encounter for general adult medical examination with abnormal findings: Secondary | ICD-10-CM

## 2023-10-02 DIAGNOSIS — E118 Type 2 diabetes mellitus with unspecified complications: Secondary | ICD-10-CM

## 2023-10-02 LAB — LIPID PANEL
Cholesterol: 143 mg/dL (ref 0–200)
HDL: 44.7 mg/dL (ref >39.00)
LDL Cholesterol: 84 mg/dL (ref 0–99)
NonHDL: 97.88
Total CHOL/HDL Ratio: 3
Triglycerides: 67 mg/dL (ref 0.0–149.0)
VLDL: 13.4 mg/dL (ref 0.0–40.0)

## 2023-10-02 LAB — URINALYSIS, ROUTINE W REFLEX MICROSCOPIC
Bilirubin Urine: NEGATIVE
Hgb urine dipstick: NEGATIVE
Ketones, ur: NEGATIVE
Leukocytes,Ua: NEGATIVE
Nitrite: NEGATIVE
RBC / HPF: NONE SEEN (ref 0–?)
Specific Gravity, Urine: 1.015 (ref 1.000–1.030)
Total Protein, Urine: NEGATIVE
Urine Glucose: >=1000 — AB
Urobilinogen, UA: 0.2 (ref 0.0–1.0)
pH: 7 (ref 5.0–8.0)

## 2023-10-02 LAB — BASIC METABOLIC PANEL WITH GFR
BUN: 12 mg/dL (ref 6–23)
CO2: 28 meq/L (ref 19–32)
Calcium: 9 mg/dL (ref 8.4–10.5)
Chloride: 102 meq/L (ref 96–112)
Creatinine, Ser: 0.85 mg/dL (ref 0.40–1.20)
GFR: 71.05 mL/min (ref >60.00)
Glucose, Bld: 88 mg/dL (ref 70–99)
Potassium: 4.2 meq/L (ref 3.5–5.1)
Sodium: 137 meq/L (ref 135–145)

## 2023-10-02 LAB — MICROALBUMIN / CREATININE URINE RATIO
Creatinine,U: 82.7 mg/dL
Microalb Creat Ratio: UNDETERMINED mg/g (ref 0.0–30.0)
Microalb, Ur: <0.7 mg/dL

## 2023-10-02 LAB — HEPATIC FUNCTION PANEL
ALT: 11 U/L (ref 0–35)
AST: 24 U/L (ref 0–37)
Albumin: 3.9 g/dL (ref 3.5–5.2)
Alkaline Phosphatase: 100 U/L (ref 39–117)
Bilirubin, Direct: 0.1 mg/dL (ref 0.0–0.3)
Total Bilirubin: 0.8 mg/dL (ref 0.2–1.2)
Total Protein: 6.6 g/dL (ref 6.0–8.3)

## 2023-10-02 LAB — HEMOGLOBIN A1C: Hgb A1c MFr Bld: 6.6 % — ABNORMAL HIGH (ref 4.6–6.5)

## 2023-10-02 LAB — TSH: TSH: 2.22 u[IU]/mL (ref 0.35–5.50)

## 2023-10-02 LAB — MAGNESIUM: Magnesium: 1.9 mg/dL (ref 1.5–2.5)

## 2023-10-02 MED ORDER — VITAMIN B-1 50 MG PO TABS
50.0000 mg | ORAL_TABLET | Freq: Every day | ORAL | 1 refills | Status: DC
Start: 2023-10-02 — End: 2024-04-10

## 2023-10-02 MED ORDER — TRIAMTERENE-HCTZ 37.5-25 MG PO CAPS
1.0000 | ORAL_CAPSULE | Freq: Every day | ORAL | 0 refills | Status: DC
Start: 2023-10-02 — End: 2024-01-02

## 2023-10-02 MED ORDER — ROSUVASTATIN CALCIUM 10 MG PO TABS: 10.0000 mg | ORAL_TABLET | Freq: Every day | ORAL | 0 refills | Status: DC

## 2023-10-02 NOTE — Patient Instructions (Signed)
 Hypertension, Adult High blood pressure (hypertension) is when the force of blood pumping through the arteries is too strong. The arteries are the blood vessels that carry blood from the heart throughout the body. Hypertension forces the heart to work harder to pump blood and may cause arteries to become narrow or stiff. Untreated or uncontrolled hypertension can lead to a heart attack, heart failure, a stroke, kidney disease, and other problems. A blood pressure reading consists of a higher number over a lower number. Ideally, your blood pressure should be below 120/80. The first ("top") number is called the systolic pressure. It is a measure of the pressure in your arteries as your heart beats. The second ("bottom") number is called the diastolic pressure. It is a measure of the pressure in your arteries as the heart relaxes. What are the causes? The exact cause of this condition is not known. There are some conditions that result in high blood pressure. What increases the risk? Certain factors may make you more likely to develop high blood pressure. Some of these risk factors are under your control, including: Smoking. Not getting enough exercise or physical activity. Being overweight. Having too much fat, sugar, calories, or salt (sodium) in your diet. Drinking too much alcohol. Other risk factors include: Having a personal history of heart disease, diabetes, high cholesterol, or kidney disease. Stress. Having a family history of high blood pressure and high cholesterol. Having obstructive sleep apnea. Age. The risk increases with age. What are the signs or symptoms? High blood pressure may not cause symptoms. Very high blood pressure (hypertensive crisis) may cause: Headache. Fast or irregular heartbeats (palpitations). Shortness of breath. Nosebleed. Nausea and vomiting. Vision changes. Severe chest pain, dizziness, and seizures. How is this diagnosed? This condition is diagnosed by  measuring your blood pressure while you are seated, with your arm resting on a flat surface, your legs uncrossed, and your feet flat on the floor. The cuff of the blood pressure monitor will be placed directly against the skin of your upper arm at the level of your heart. Blood pressure should be measured at least twice using the same arm. Certain conditions can cause a difference in blood pressure between your right and left arms. If you have a high blood pressure reading during one visit or you have normal blood pressure with other risk factors, you may be asked to: Return on a different day to have your blood pressure checked again. Monitor your blood pressure at home for 1 week or longer. If you are diagnosed with hypertension, you may have other blood or imaging tests to help your health care provider understand your overall risk for other conditions. How is this treated? This condition is treated by making healthy lifestyle changes, such as eating healthy foods, exercising more, and reducing your alcohol intake. You may be referred for counseling on a healthy diet and physical activity. Your health care provider may prescribe medicine if lifestyle changes are not enough to get your blood pressure under control and if: Your systolic blood pressure is above 130. Your diastolic blood pressure is above 80. Your personal target blood pressure may vary depending on your medical conditions, your age, and other factors. Follow these instructions at home: Eating and drinking  Eat a diet that is high in fiber and potassium, and low in sodium, added sugar, and fat. An example of this eating plan is called the DASH diet. DASH stands for Dietary Approaches to Stop Hypertension. To eat this way: Eat  plenty of fresh fruits and vegetables. Try to fill one half of your plate at each meal with fruits and vegetables. Eat whole grains, such as whole-wheat pasta, brown rice, or whole-grain bread. Fill about one  fourth of your plate with whole grains. Eat or drink low-fat dairy products, such as skim milk or low-fat yogurt. Avoid fatty cuts of meat, processed or cured meats, and poultry with skin. Fill about one fourth of your plate with lean proteins, such as fish, chicken without skin, beans, eggs, or tofu. Avoid pre-made and processed foods. These tend to be higher in sodium, added sugar, and fat. Reduce your daily sodium intake. Many people with hypertension should eat less than 1,500 mg of sodium a day. Do not drink alcohol if: Your health care provider tells you not to drink. You are pregnant, may be pregnant, or are planning to become pregnant. If you drink alcohol: Limit how much you have to: 0-1 drink a day for women. 0-2 drinks a day for men. Know how much alcohol is in your drink. In the [place] one drink equals one 12 oz bottle of beer (355 mL), one 5 oz glass of wine (148 mL), or one 1 oz glass of hard liquor (44 mL). Lifestyle  Work with your health care provider to maintain a healthy body weight or to lose weight. Ask what an ideal weight is for you. Get at least 30 minutes of exercise that causes your heart to beat faster (aerobic exercise) most days of the week. Activities may include walking, swimming, or biking. Include exercise to strengthen your muscles (resistance exercise), such as Pilates or lifting weights, as part of your weekly exercise routine. Try to do these types of exercises for 30 minutes at least 3 days a week. Do not use any products that contain nicotine or tobacco. These products include cigarettes, chewing tobacco, and vaping devices, such as e-cigarettes. If you need help quitting, ask your health care provider. Monitor your blood pressure at home as told by your health care provider. Keep all follow-up visits. This is important. Medicines Take over-the-counter and prescription medicines only as told by your health care provider. Follow directions carefully. Blood  pressure medicines must be taken as prescribed. Do not skip doses of blood pressure medicine. Doing this puts you at risk for problems and can make the medicine less effective. Ask your health care provider about side effects or reactions to medicines that you should watch for. Contact a health care provider if you: Think you are having a reaction to a medicine you are taking. Have headaches that keep coming back (recurring). Feel dizzy. Have swelling in your ankles. Have trouble with your vision. Get help right away if you: Develop a severe headache or confusion. Have unusual weakness or numbness. Feel faint. Have severe pain in your chest or abdomen. Vomit repeatedly. Have trouble breathing. These symptoms may be an emergency. Get help right away. Call 911. Do not wait to see if the symptoms will go away. Do not drive yourself to the hospital. Summary Hypertension is when the force of blood pumping through your arteries is too strong. If this condition is not controlled, it may put you at risk for serious complications. Your personal target blood pressure may vary depending on your medical conditions, your age, and other factors. For most people, a normal blood pressure is less than 120/80. Hypertension is treated with lifestyle changes, medicines, or a combination of both. Lifestyle changes include losing weight, eating a healthy,  low-sodium diet, exercising more, and limiting alcohol. This information is not intended to replace advice given to you by your health care provider. Make sure you discuss any questions you have with your health care provider. Document Revised: 04/13/2021 Document Reviewed: 04/13/2021 Elsevier Patient Education  2024 [employer].

## 2023-10-02 NOTE — Progress Notes (Signed)
 Subjective:  Patient ID: Jaime [name], female    DOB: 01/29/1956  Age: 68 y.o. MRN: [id number]  CC: Anemia, Hypertension, Hyperlipidemia, and Diabetes   HPI Jaime [name] presents for f/up ----  Discussed the use of AI scribe software for clinical note transcription with the patient, who gave verbal consent to proceed.  History of Present Illness   [name] is a 68 year old female who presents for a preoperative evaluation and updated A1c.  She is scheduled for surgery to address a bone spur on her left shoulder and cysts in her left hand, which are causing her pain.  She has a history of hypertension, previously managed with medication, which was discontinued. Her blood pressure was noted to be high, with the last recorded measurement at 156/94 mmHg. She is unsure why her blood pressure is elevated, as it has been stable off medication. No headaches, blurred vision (except when not wearing glasses), chest pain, or shortness of breath. Her last eye exam was in November 2023.  She does not recall any issues with her potassium levels, although low potassium levels were identified in previous lab work. The cause of the hypokalemia is not discussed, but it is a known condition for her.  Her allergy symptoms have cleared up.       Outpatient Medications Prior to Visit  Medication Sig Dispense Refill   aspirin 325 MG tablet Take 325 mg by mouth daily.      Cholecalciferol (VITAMIN D) 125 MCG (5000 UT) CAPS Take by mouth.     fluticasone (FLONASE) 50 MCG/ACT nasal spray Place 2 sprays into both nostrils daily. (Patient taking differently: Place 2 sprays into both nostrils as needed for allergies.) 48 mL 1   gabapentin (NEURONTIN) 100 MG capsule Take 100 mg by mouth 3 (three) times daily as needed.     Magnesium 250 MG TABS Take 1 tablet (250 mg total) by mouth daily. 90 tablet 1   Multiple Vitamins-Minerals (MULTIVITAMINS THER. W/MINERALS) TABS Take 1 tablet by mouth daily as  needed (nutrition).     pantoprazole (PROTONIX) 40 MG tablet TAKE 1 TABLET BY MOUTH EVERY DAY IN THE MORNING 30 tablet 5   SYNJARDY 5-500 MG TABS TAKE 1 TABLET BY MOUTH TWICE A DAY 180 tablet 1   levocetirizine (XYZAL) 5 MG tablet Take 1 tablet (5 mg total) by mouth every evening. 90 tablet 1   thiamine (VITAMIN B-1) 50 MG tablet Take 1 tablet (50 mg total) by mouth daily. 90 tablet 1   No facility-administered medications prior to visit.    ROS Review of Systems  Constitutional:  Positive for unexpected weight change (wt gain). Negative for appetite change, chills, diaphoresis and fatigue.  HENT: Negative.    Eyes: Negative.   Respiratory:  Negative for cough, chest tightness and wheezing.   Cardiovascular:  Negative for chest pain, palpitations and leg swelling.  Gastrointestinal: Negative.  Negative for abdominal pain, constipation, diarrhea, nausea and vomiting.  Endocrine: Negative.   Genitourinary:  Negative for difficulty urinating.  Musculoskeletal:  Positive for arthralgias. Negative for myalgias.  Skin: Negative.   Neurological: Negative.  Negative for dizziness and weakness.  Hematological:  Negative for adenopathy. Does not bruise/bleed easily.  Psychiatric/Behavioral: Negative.      Objective:  BP (!) 156/94 (BP Location: Right Arm, Patient Position: Sitting, Cuff Size: Normal)   Pulse 68   Temp 98.1 F (36.7 C) (Oral)   Resp 16   Ht 5\' 6"  (1.676 m)  Wt 175 lb 3.2 oz (79.5 kg)   SpO2 94%   BMI 28.28 kg/m   BP Readings from Last 3 Encounters:  10/02/23 (!) 156/94  08/21/23 (!) 161/75  04/12/23 (!) 145/82    Wt Readings from Last 3 Encounters:  10/02/23 175 lb 3.2 oz (79.5 kg)  08/24/23 171 lb (77.6 kg)  04/12/23 162 lb (73.5 kg)    Physical Exam Vitals reviewed.  Constitutional:      Appearance: Normal appearance.  HENT:     Nose: Nose normal.     Mouth/Throat:     Mouth: Mucous membranes are moist.  Eyes:     General: No scleral icterus.     Conjunctiva/sclera: Conjunctivae normal.  Cardiovascular:     Rate and Rhythm: Normal rate and regular rhythm.     Heart sounds: No murmur heard.    No friction rub. No gallop.     Comments: EKG--- NSR, 65 bpm NS T wave changes No LVH or Q waves Unchanged  Pulmonary:     Effort: Pulmonary effort is normal.     Breath sounds: No stridor. No wheezing, rhonchi or rales.  Abdominal:     General: Abdomen is flat.     Palpations: There is no mass.     Tenderness: There is no abdominal tenderness. There is no guarding.     Hernia: No hernia is present.  Musculoskeletal:        General: Normal range of motion.     Cervical back: Neck supple.     Right lower leg: No edema.     Left lower leg: No edema.  Skin:    General: Skin is warm and dry.  Neurological:     General: No focal deficit present.     Mental Status: She is alert.  Psychiatric:        Mood and Affect: Mood normal.        Behavior: Behavior normal.     Lab Results  Component Value Date   WBC 8.1 08/21/2023   HGB 10.8 (L) 08/21/2023   HCT 36.1 08/21/2023   PLT 269 08/21/2023   GLUCOSE 88 10/02/2023   CHOL 143 10/02/2023   TRIG 67.0 10/02/2023   HDL 44.70 10/02/2023   LDLCALC 84 10/02/2023   ALT 11 10/02/2023   AST 24 10/02/2023   NA 137 10/02/2023   K 4.2 10/02/2023   CL 102 10/02/2023   CREATININE 0.85 10/02/2023   BUN 12 10/02/2023   CO2 28 10/02/2023   TSH 2.22 10/02/2023   HGBA1C 6.6 (H) 10/02/2023   MICROALBUR <0.7 10/02/2023    DG Bone Density Result Date: 08/30/2023 Table formatting from the original result was not included. Date of study: 08/28/2023 Exam: DUAL X-RAY ABSORPTIOMETRY (DXA) FOR BONE MINERAL DENSITY (BMD) Instrument: [employer] Requesting Provider: PCP Indication: follow up for low BMD Comparison: none (please note that it is not possible to compare data from different instruments) Clinical data: Pt is a 68 y.o. female with previous history of fracture. Results:  Lumbar spine  L1-L4 Femoral neck (FN) T-score -0.6 RFN: -0.1 LFN: -1.4 Change in BMD from previous DXA test (%) n/a n/a (*) statistically significant Assessment: By the [facility] Criteria for diagnosis based on bone density, this patient has Low Bone Density FRAX 10-year fracture risk calculator: 6.7 % for any major fracture and 0.7 % for hip fracture. Pharmacologic therapy is recommended if 10 year fracture risk is >20% for any major osteoporotic fracture or >3% for hip  fracture.  Comments: the technical quality of the study is good. WHO criteria for diagnosis of osteoporosis in postmenopausal women and in men 73 y/o or older: - normal: T-score -1.0 to + 1.0 - osteopenia/low bone density: T-score between -2.5 and -1.0 - osteoporosis: T-score below -2.5 - severe osteoporosis: T-score below -2.5 with history of fragility fracture Note: although not part of the WHO classification, the presence of a fragility fracture, regardless of the T-score, should be considered diagnostic of osteoporosis, provided other causes for the fracture have been excluded. RECOMMENDATION: 1. All patients should optimize calcium and vitamin D intake. 2. Consider FDA-approved medical therapies in postmenopausal women and men aged 61 years and older, based on the following: a. A hip or vertebral(clinical or morphometric) fracture. b. T-Score of  -2.5 or less at the femoral neck , total hip or spine after appropriate evaluation to exclude secondary causes c. Low bone mass (T-score between -1.0 and -2.5 at the femoral neck or spine) and a 10 year probability of a hip fracture >3% or a 10 year probability of major osteoporosis-related fracture > 20% based on the [place] -adapted WHO algorithm d. Clinical judgement and/or patient preferences may indicate treatment for people with 10-year fracture probabilities above or below these levels Follow up BMD is recommended: 2 years Interpreted by : Natale Bail, MD [employer] [facility]    Assessment & Plan:    Type II diabetes mellitus with manifestations (HCC)- Her blood sugar is well controlled. -     Hemoglobin A1c; Future -     Basic metabolic panel with GFR; Future -     Microalbumin / creatinine urine ratio; Future -     Ambulatory referral to Ophthalmology  Hyperlipidemia LDL goal <130- Will start a statin for CV risk reduction. -     Lipid panel; Future -     TSH; Future -     Hepatic function panel; Future  Anemia due to acquired thiamine deficiency -     Vitamin B-1; Take 1 tablet (50 mg total) by mouth daily.  Dispense: 90 tablet; Refill: 1  Hypomagnesemia -     Magnesium; Future  Essential hypertension- BP is not at goal. EKG is negative for LVH. Will start dyazide. -     TSH; Future -     Urinalysis, Routine w reflex microscopic; Future -     Basic metabolic panel with GFR; Future -     Aldosterone + renin activity w/ ratio; Future -     EKG 12-Lead -     Triamterene-HCTZ; Take 1 each (1 capsule total) by mouth daily.  Dispense: 90 capsule; Refill: 0  Hypokalemia -     Aldosterone + renin activity w/ ratio; Future     Follow-up: Return in about 3 months (around 01/01/2024).  Gissela Crouch, MD

## 2023-10-02 NOTE — Assessment & Plan Note (Signed)
 Exam completed, labs reviewed, vaccines reviewed, cancer screenings are UTD, pt ed material was given.

## 2023-10-06 ENCOUNTER — Telehealth: Payer: Self-pay | Admitting: *Deleted

## 2023-10-06 LAB — ALDOSTERONE + RENIN ACTIVITY W/ RATIO
ALDO / PRA Ratio: 15.8 ratio (ref 0.9–28.9)
Aldosterone: 3 ng/dL
Renin Activity: 0.19 ng/mL/h — ABNORMAL LOW (ref 0.25–5.82)

## 2023-10-06 NOTE — Progress Notes (Signed)
 Care Guide Pharmacy Note  10/06/2023 Name: [name] MRN: [id number] DOB: [birth year]  Referred By: Arcadio Knuckles, MD Reason for referral: Complex Care Management (Outreach to schedule referral with pharmacist )   Jaime [name] is a 68 y.o. year old female who is a primary care patient of Arcadio Knuckles, MD.  Sherol Dixie Lovvorn was referred to the pharmacist for assistance related to: HTN  Successful contact was made with the patient to discuss pharmacy services including being ready for the pharmacist to call at least 5 minutes before the scheduled appointment time and to have medication bottles and any blood pressure readings ready for review. The patient agreed to meet with the pharmacist via telephone visit on 10/24/2023  Kandis Ormond, CMA [facility], [facility] Guide Direct Dial: [phone]  Fax: [phone] Website: [email or web address]

## 2023-10-24 ENCOUNTER — Other Ambulatory Visit (INDEPENDENT_AMBULATORY_CARE_PROVIDER_SITE_OTHER): Admitting: Pharmacist

## 2023-10-24 DIAGNOSIS — I10 Essential (primary) hypertension: Secondary | ICD-10-CM

## 2023-10-24 NOTE — Patient Instructions (Addendum)
 It was great speaking to you today!  Discontinue rosuvastatin . Continue atorvastatin  and triamterene -hydrochlorothiazide . Start checking blood pressure at home once daily at least 2 hours after taking blood pressure medication. Write readings down and have those ready for our follow-up visit. Visit office on Tuesday, May 20th to get labs.  Feel free to call with questions or concerns.  Abelina Abide, PharmD PGY1 Pharmacy Resident 10/24/2023 4:00 PM  Rainelle Bur, PharmD, BCPS, CPP Clinical Pharmacist Practitioner [facility] [phone]

## 2023-10-24 NOTE — Progress Notes (Signed)
 10/24/2023 Name: Jaime [name] MRN: [id number] DOB: 1956/05/04  Chief Complaint  Patient presents with   Hypertension   Medication Management   Hyperlipidemia    Jaime [name] is a 68 y.o. year old female who presented for a telephone visit.   They were referred to the pharmacist by their PCP for assistance in managing hypertension.   Subjective:  Patient reports she recently had surgery on 4/24. Before the surgery, she notes she was on and off with taking her medications. Since the surgery, she has been taking her medications daily. If she does miss a dose, it is usually her evening meds. Patient does utilize a pill box.  Care Team: Primary Care Provider: Arcadio Knuckles, MD ; Next Scheduled Visit: 01/03/24  Medication Access/Adherence  Current Pharmacy:  [id number] - [place], [place] - [address] AT [address] OF [address] [place] [address] Phone: [phone] Fax: [phone]   Patient reports affordability concerns with their medications: No  Patient reports access/transportation concerns to their pharmacy: No  Patient reports adherence concerns with their medications:  No    Hypertension:  Current medications: triamterene -hydrochlorothiazide  37.5-50mg  Medications previously tried: amlodipine , losartan , metoprolol  succinate (no issues with tolerance)  Patient has a validated, automated, upper arm home BP cuff Current blood pressure readings readings: N/A, patient has not been checking BP at home  Current meal patterns: not discussed  Current physical activity: not discussed   Hyperlipidemia/ASCVD Risk Reduction  Current lipid lowering medications: rosuvastatin  10 mg daily, atorvastatin  80 mg daily (confirmed she is taking both) *Patient states she has plenty of atorvastatin  at home and has been taking it for a while. She then also started taking the rosuvastatin  once instructed at recent PCP  visit.  Antiplatelet regimen: aspirin  325 mg daily  ASCVD History: CAD, PVD  Objective:  Lab Results  Component Value Date   HGBA1C 6.6 (H) 10/02/2023    Lab Results  Component Value Date   CREATININE 0.85 10/02/2023   BUN 12 10/02/2023   NA 137 10/02/2023   K 4.2 10/02/2023   CL 102 10/02/2023   CO2 28 10/02/2023    Lab Results  Component Value Date   CHOL 143 10/02/2023   HDL 44.70 10/02/2023   LDLCALC 84 10/02/2023   TRIG 67.0 10/02/2023   CHOLHDL 3 10/02/2023    Medications Reviewed Today     Reviewed by Marybelle Smiling, RPH (Pharmacist) on 10/24/23 at 1443  Med List Status: <None>   Medication Order Taking? Sig Documenting Provider Last Dose Status Informant  aspirin  325 MG tablet [id number] Yes Take 325 mg by mouth daily.  [provider] Taking Active Self  atorvastatin  (LIPITOR) 80 MG tablet [id number] Yes Take 80 mg by mouth daily. [provider] Taking Active   Cholecalciferol (VITAMIN D ) 125 MCG (5000 UT) CAPS [id number]  Take by mouth. [provider]  Active   fluticasone  (FLONASE ) 50 MCG/ACT nasal spray [id number] Yes Place 2 sprays into both nostrils daily.  Patient taking differently: Place 2 sprays into both nostrils as needed for allergies.   Arcadio Knuckles, MD Taking Active   gabapentin  (NEURONTIN ) 100 MG capsule [id number] Yes Take 100 mg by mouth 3 (three) times daily as needed. [provider] Taking Active            Med Note [name], [name]   Tue Oct 25, 2022 10:30 AM) As needed  levocetirizine (XYZAL ) 5 MG tablet [id number] Yes  Take 5 mg by mouth every evening. [provider] Taking Active   Magnesium  250 MG TABS [id number]  Take 1 tablet (250 mg total) by mouth daily. Arcadio Knuckles, MD  Active   Multiple Vitamins-Minerals (MULTIVITAMINS THER. W/MINERALS) Filbert Huff [id number]  Take 1 tablet by mouth daily as needed (nutrition). [provider]  Active Self  pantoprazole  (PROTONIX ) 40 MG tablet  [id number] Yes TAKE 1 TABLET BY MOUTH EVERY DAY IN THE MORNING Arcadio Knuckles, MD Taking Active   SYNJARDY  5-500 MG TABS [id number] Yes TAKE 1 TABLET BY MOUTH TWICE A DAY Arcadio Knuckles, MD Taking Active   thiamine (VITAMIN B-1) 50 MG tablet [id number] Yes Take 1 tablet (50 mg total) by mouth daily. Arcadio Knuckles, MD Taking Active   triamterene -hydrochlorothiazide  (DYAZIDE) 37.5-25 MG capsule [id number] Yes Take 1 each (1 capsule total) by mouth daily. Arcadio Knuckles, MD Taking Active              Assessment/Plan:   Hypertension: - Currently uncontrolled based on prior office visit reading. No home BP readings provided - Reviewed long term cardiovascular and renal outcomes of uncontrolled blood pressure - Reviewed appropriate blood pressure monitoring technique. Recommended to check home blood pressure once daily at least 2 hours after taking meds - Patient aware to record BP readings and have ready for next visit - Recommend to continue triamterene -hydrochlorothiazide  37.5-50mg   - To come for walk-in labs next week   Hyperlipidemia/ASCVD Risk Reduction: - Currently uncontrolled. LDL 84 at last office visit in April 2024 - goal LDL < 55 - Reviewed long term complications of uncontrolled cholesterol - Atorvastatin  had been removed from chart medication list in the past for unknown reasons, however, patient continued to take it. As it appeared the patient was not on a statin at last office visit, rosuvastatin  was initiated. Therefore patient has been taking both atorvastatin  and rosuvastatin . - Recommend to discontinue rosuvastatin  - High-intensity statin indicated: continue atorvastatin  80 mg daily  Follow Up Plan: in 2wks after BMP obtained for recent initiation of triamterene -hydrochlorothiazide   Abelina Abide, PharmD PGY1 Pharmacy Resident 10/24/2023 4:00 PM  Rainelle Bur, PharmD, BCPS, CPP Clinical Pharmacist Practitioner [facility] [phone]

## 2023-11-07 ENCOUNTER — Other Ambulatory Visit (INDEPENDENT_AMBULATORY_CARE_PROVIDER_SITE_OTHER)

## 2023-11-07 DIAGNOSIS — I10 Essential (primary) hypertension: Secondary | ICD-10-CM | POA: Diagnosis not present

## 2023-11-07 LAB — BASIC METABOLIC PANEL WITH GFR
BUN: 12 mg/dL (ref 6–23)
CO2: 28 meq/L (ref 19–32)
Calcium: 8.7 mg/dL (ref 8.4–10.5)
Chloride: 105 meq/L (ref 96–112)
Creatinine, Ser: 0.77 mg/dL (ref 0.40–1.20)
GFR: 79.95 mL/min (ref >60.00)
Glucose, Bld: 78 mg/dL (ref 70–99)
Potassium: 3.4 meq/L — ABNORMAL LOW (ref 3.5–5.1)
Sodium: 142 meq/L (ref 135–145)

## 2023-11-08 ENCOUNTER — Ambulatory Visit: Payer: Self-pay | Admitting: Pharmacist

## 2023-11-10 ENCOUNTER — Other Ambulatory Visit (INDEPENDENT_AMBULATORY_CARE_PROVIDER_SITE_OTHER): Admitting: Pharmacist

## 2023-11-10 DIAGNOSIS — I10 Essential (primary) hypertension: Secondary | ICD-10-CM

## 2023-11-10 NOTE — Patient Instructions (Signed)
 It was a pleasure speaking with you today!  Continue your current medication regimen. Continue checking blood pressure 1-2x per week.  Feel free to call with any questions or concerns!  Rainelle Bur, PharmD, BCPS, CPP Clinical Pharmacist Practitioner [facility] at [facility] [phone]

## 2023-11-10 NOTE — Progress Notes (Signed)
 11/10/2023 Name: Jaime [name] MRN: [id number] DOB: 22-Aug-1955  Chief Complaint  Patient presents with   Hypertension   Medication Management    Jaime [name] is [name] 68 y.o. year old female who presented for [name] telephone visit.   They were referred to the pharmacist by their PCP for assistance in managing hypertension.   Subjective:  Care Team: Primary Care Provider: Arcadio Knuckles, MD ; Next Scheduled Visit: 01/03/24  Medication Access/Adherence  Current Pharmacy:  [id number] - [place], [place] - [address] AT [facility] OF [address] [name] [address] Phone: [phone] Fax: [phone]   Patient reports affordability concerns with their medications: No  Patient reports access/transportation concerns to their pharmacy: No  Patient reports adherence concerns with their medications:  No    Hypertension:  Current medications: triamterene -hydrochlorothiazide  37.5-50mg  Medications previously tried: amlodipine , losartan , metoprolol  succinate (no issues with tolerance)  Patient has [name] validated, automated, upper arm home BP cuff Current blood pressure readings:  5/9 131/80 (87) 5/10 111/72 (76) 5/11 128/81 (77) 5/13 113/79 (78) 5/14 122/83 (80) 5/15 120/81 (81) 5/16 110/76 (71) 5/17 108/70 (83) 5/18 129/84 (62) 5/20 130/73 (62) 5/21 117/74 (78) 5/22 125/79 (73) 5/23 120/83 (75)  No dizziness or weakness. Reports fatigue.    Hyperlipidemia/ASCVD Risk Reduction  Current lipid lowering medications: atorvastatin  80 mg daily    Antiplatelet regimen: aspirin  325 mg daily  ASCVD History: CAD, PVD  Objective:  Lab Results  Component Value Date   HGBA1C 6.6 (H) 10/02/2023    Lab Results  Component Value Date   CREATININE 0.77 11/07/2023   BUN 12 11/07/2023   NA 142 11/07/2023   K 3.4 (L) 11/07/2023   CL 105 11/07/2023   CO2 28 11/07/2023    Lab Results  Component Value Date   CHOL 143  10/02/2023   HDL 44.70 10/02/2023   LDLCALC 84 10/02/2023   TRIG 67.0 10/02/2023   CHOLHDL 3 10/02/2023    Medications Reviewed Today     Reviewed by Jaime [name], RPH (Pharmacist) on 11/10/23 at 1630  Med List Status: <None>   Medication Order Taking? Sig Documenting Provider Last Dose Status Informant  aspirin  325 MG tablet [id number]  Take 325 mg by mouth daily.  [provider]  Active Self  atorvastatin  (LIPITOR) 80 MG tablet [id number] Yes Take 80 mg by mouth daily. [provider] Taking Active   Cholecalciferol (VITAMIN D ) 125 MCG (5000 UT) CAPS [id number]  Take by mouth. [provider]  Active   fluticasone  (FLONASE ) 50 MCG/ACT nasal spray [id number]  Place 2 sprays into both nostrils daily.  Patient taking differently: Place 2 sprays into both nostrils as needed for allergies.   Jaime Knuckles, MD  Active   gabapentin  (NEURONTIN ) 100 MG capsule [id number]  Take 100 mg by mouth 3 (three) times daily as needed. [provider]  Active            Med Note Jaime [name], Jaime [name]   Tue Oct 25, 2022 10:30 AM) As needed  levocetirizine (XYZAL ) 5 MG tablet [id number]  Take 5 mg by mouth every evening. [provider]  Active   Magnesium  250 MG TABS [id number]  Take 1 tablet (250 mg total) by mouth daily. Jaime Knuckles, MD  Active   Multiple Vitamins-Minerals (MULTIVITAMINS THER. W/MINERALS) Filbert Huff [id number]  Take 1 tablet by mouth daily as needed (nutrition). [provider]  Active Self  pantoprazole  (PROTONIX ) 40  MG tablet [id number]  TAKE 1 TABLET BY MOUTH EVERY DAY IN THE MORNING Jaime Knuckles, MD  Active   SYNJARDY  5-500 MG TABS [id number] Yes TAKE 1 TABLET BY MOUTH TWICE [name] DAY Jaime Knuckles, MD Taking Active   thiamine (VITAMIN B-1) 50 MG tablet [id number]  Take 1 tablet (50 mg total) by mouth daily. Jaime Knuckles, MD  Active   triamterene -hydrochlorothiazide  (DYAZIDE) 37.5-25 MG capsule [id number] Yes Take 1 each (1 capsule total)  by mouth daily. Jaime Knuckles, MD Taking Active              Assessment/Plan:   Hypertension: - Currently controlled based on home readings. BP goal <130/80 - Reviewed appropriate blood pressure monitoring technique. Recommended to check home blood pressure occasionally - Recommend to continue triamterene -hydrochlorothiazide  37.5-50mg   - BMP with Scr stable/WNL and potassium was slightly low at 3.4 - recommend eating banana daily   Hyperlipidemia/ASCVD Risk Reduction: - Currently uncontrolled. LDL 84 at last office visit in April 2024 - goal LDL < 55 - High-intensity statin indicated: continue atorvastatin  80 mg daily  Follow Up Plan: PCP f/u 7/18  Jaime [name], PharmD, BCPS, CPP Clinical Pharmacist Practitioner [place] [facility] [phone]

## 2023-11-15 ENCOUNTER — Telehealth: Payer: Self-pay | Admitting: Internal Medicine

## 2023-11-15 ENCOUNTER — Encounter: Admitting: Internal Medicine

## 2023-11-15 NOTE — Telephone Encounter (Signed)
 Copied from CRM [id number]. Topic: Clinical - Medication Question >> Nov 15, 2023 12:00 PM Caliyah H wrote: Reason for CRM: Devaughn Flowers, the pharmacist, called regarding the patient's SYNJARDY  prescription. She inquired if the medication could be switched to the XR (extended-release) which would allow the patient to take one tablet daily. She noted that the patient has been having difficulty remembering to take the evening dose.  Callback Number: [phone] ext: [phone]

## 2023-11-16 ENCOUNTER — Telehealth: Payer: Self-pay | Admitting: Internal Medicine

## 2023-11-16 NOTE — Telephone Encounter (Signed)
 Prescription Request  11/16/2023  LOV: 10/02/2023  What is the name of the medication or equipment? atorvastatin  (LIPITOR) 80 MG tablet   Have you contacted your pharmacy to request a refill? Yes   Which pharmacy would you like this sent to?  [id number] - [place], [place] - [address] AT [facility] [address] [place] [address] Phone: [phone] Fax: [phone]    Patient notified that their request is being sent to the clinical staff for review and that they should receive a response within 2 business days.   Please advise at Mobile [phone] (mobile)

## 2023-11-17 ENCOUNTER — Other Ambulatory Visit: Payer: Self-pay

## 2023-11-17 ENCOUNTER — Other Ambulatory Visit: Payer: Self-pay | Admitting: Internal Medicine

## 2023-11-17 DIAGNOSIS — E118 Type 2 diabetes mellitus with unspecified complications: Secondary | ICD-10-CM

## 2023-11-17 MED ORDER — SYNJARDY XR 5-1000 MG PO TB24: 2.0000 | ORAL_TABLET | Freq: Every day | ORAL | 0 refills | Status: DC

## 2023-11-17 MED ORDER — ATORVASTATIN CALCIUM 80 MG PO TABS: 80.0000 mg | ORAL_TABLET | Freq: Every day | ORAL | 0 refills | Status: DC

## 2023-11-17 NOTE — Telephone Encounter (Signed)
 RHONDA  from [facility] is calling in regarding the patient medication she is wanting to know has there been a update on the medication

## 2023-11-17 NOTE — Telephone Encounter (Signed)
**Note De-identified  [place] Obfuscation** Please advise 

## 2023-11-17 NOTE — Telephone Encounter (Signed)
 Duplicate

## 2023-11-17 NOTE — Telephone Encounter (Signed)
 Medication refill request has been sent to Dr. Yetta Barre

## 2023-11-17 NOTE — Progress Notes (Unsigned)
 Lab Results  Component Value Date   WBC 8.1 08/21/2023   HGB 10.8 (L) 08/21/2023   HCT 36.1 08/21/2023   PLT 269 08/21/2023   GLUCOSE 78 11/07/2023   CHOL 143 10/02/2023   TRIG 67.0 10/02/2023   HDL 44.70 10/02/2023   LDLCALC 84 10/02/2023   ALT 11 10/02/2023   AST 24 10/02/2023   NA 142 11/07/2023   K 3.4 (L) 11/07/2023   CL 105 11/07/2023   CREATININE 0.77 11/07/2023   BUN 12 11/07/2023   CO2 28 11/07/2023   TSH 2.22 10/02/2023   HGBA1C 6.6 (H) 10/02/2023   MICROALBUR <0.7 10/02/2023

## 2023-11-17 NOTE — Telephone Encounter (Signed)
 Dr. Rochelle Chu did not make any changes to her medication. I have left a detailed message for rhonda in regards to the medication not being changed.

## 2023-12-01 ENCOUNTER — Ambulatory Visit (INDEPENDENT_AMBULATORY_CARE_PROVIDER_SITE_OTHER): Admitting: Podiatry

## 2023-12-01 DIAGNOSIS — B351 Tinea unguium: Secondary | ICD-10-CM

## 2023-12-01 DIAGNOSIS — M7751 Other enthesopathy of right foot: Secondary | ICD-10-CM | POA: Diagnosis not present

## 2023-12-01 DIAGNOSIS — M79671 Pain in right foot: Secondary | ICD-10-CM | POA: Diagnosis not present

## 2023-12-01 NOTE — Progress Notes (Signed)
 Patient presents for evaluation and treatment of tenderness and some redness around the second toenail right.  Tenderness around toe with walking and wearing shoes.  Nails very thickened Balzer pain.  Has some pain on the dorsal aspect of the second toe over the PIPJ.  Does not recall any injury to the toe.  Physical exam:  General appearance: Alert, pleasant, and in no acute distress.  Vascular: Pedal pulses: DP 2/4 B/L, PT 2/4 B/L.  No edema lower legs bilaterally.  Capillary fill time immediate  Neurological:  Grossly intact.  Light touch intact bilaterally  Dermatologic:  Nails thickened, disfigured, discolored 2 RT with subungual debris.  Redness and hypertrophic nail folds along nail folds bilaterally but no signs of drainage or infection.  Musculoskeletal:  Hammertoe second toe right with some soreness over the dorsal aspect of the PIPJ of the second toe right.   Diagnosis: 1. Painful onychomycotic nail 2 RT 2. Pain toes 1 through 5 bilaterally. 3.  Bursitis of second toe right secondary to hammertoe.  Plan: -New patient visit level 3 for evaluation and management. - Discussed with her the onychomycotic nail in the second toe right.  Discussed discussed options including keeping the nail debrided on a periodic basis versus permanent removal of the nail plate.  She would like to just have it debrided at this point.  Discussed the hammertoe deformity.  Try to wear shoes that do not put pressure on it and well cushioned socks.   -Debrided onychomycotic nail 2 RT  Return 3 months

## 2023-12-04 ENCOUNTER — Other Ambulatory Visit: Payer: Self-pay | Admitting: Internal Medicine

## 2023-12-04 ENCOUNTER — Telehealth: Payer: Self-pay | Admitting: Internal Medicine

## 2023-12-04 DIAGNOSIS — E118 Type 2 diabetes mellitus with unspecified complications: Secondary | ICD-10-CM

## 2023-12-04 MED ORDER — SYNJARDY XR 5-1000 MG PO TB24: 1.0000 | ORAL_TABLET | Freq: Every day | ORAL | 1 refills | Status: DC

## 2023-12-04 NOTE — Telephone Encounter (Unsigned)
 Copied from CRM [id number]. Topic: Clinical - Medication Question >> Dec 04, 2023  8:53 AM Deaijah H wrote: Reason for CRM: Patient would like to have Dr. Rochelle Chu nurse give a call for clarification on medication Empagliflozin-metFORMIN HCl ER (SYNJARDY  XR) 10-998 MG TB24. Please call [phone]

## 2023-12-06 NOTE — Telephone Encounter (Signed)
 Patient came in office on 06/17 to get this handled

## 2023-12-22 ENCOUNTER — Other Ambulatory Visit: Payer: Self-pay | Admitting: Internal Medicine

## 2023-12-22 DIAGNOSIS — E118 Type 2 diabetes mellitus with unspecified complications: Secondary | ICD-10-CM

## 2023-12-26 ENCOUNTER — Other Ambulatory Visit: Payer: Self-pay | Admitting: Internal Medicine

## 2023-12-26 DIAGNOSIS — K219 Gastro-esophageal reflux disease without esophagitis: Secondary | ICD-10-CM

## 2023-12-28 ENCOUNTER — Other Ambulatory Visit: Payer: Self-pay | Admitting: Internal Medicine

## 2023-12-28 DIAGNOSIS — I10 Essential (primary) hypertension: Secondary | ICD-10-CM

## 2024-01-03 ENCOUNTER — Encounter: Payer: Self-pay | Admitting: Internal Medicine

## 2024-01-03 ENCOUNTER — Ambulatory Visit (INDEPENDENT_AMBULATORY_CARE_PROVIDER_SITE_OTHER): Admitting: Internal Medicine

## 2024-01-03 VITALS — BP 90/82 | HR 86 | Temp 98.2°F | Ht 66.0 in | Wt 169.4 lb

## 2024-01-03 DIAGNOSIS — E118 Type 2 diabetes mellitus with unspecified complications: Secondary | ICD-10-CM

## 2024-01-03 DIAGNOSIS — E876 Hypokalemia: Secondary | ICD-10-CM | POA: Insufficient documentation

## 2024-01-03 DIAGNOSIS — I959 Hypotension, unspecified: Secondary | ICD-10-CM | POA: Diagnosis not present

## 2024-01-03 DIAGNOSIS — R9431 Abnormal electrocardiogram [ECG] [EKG]: Secondary | ICD-10-CM

## 2024-01-03 DIAGNOSIS — I10 Essential (primary) hypertension: Secondary | ICD-10-CM

## 2024-01-03 LAB — CBC WITH DIFFERENTIAL/PLATELET
Basophils Absolute: 0.1 K/uL (ref 0.0–0.1)
Basophils Relative: 1.2 % (ref 0.0–3.0)
Eosinophils Absolute: 0.3 K/uL (ref 0.0–0.7)
Eosinophils Relative: 3.2 % (ref 0.0–5.0)
HCT: 35.9 % — ABNORMAL LOW (ref 36.0–46.0)
Hemoglobin: 11.4 g/dL — ABNORMAL LOW (ref 12.0–15.0)
Lymphocytes Relative: 23.5 % (ref 12.0–46.0)
Lymphs Abs: 2.1 K/uL (ref 0.7–4.0)
MCHC: 31.9 g/dL (ref 30.0–36.0)
MCV: 78.8 fl (ref 78.0–100.0)
Monocytes Absolute: 0.7 K/uL (ref 0.1–1.0)
Monocytes Relative: 7.4 % (ref 3.0–12.0)
Neutro Abs: 5.7 K/uL (ref 1.4–7.7)
Neutrophils Relative %: 64.7 % (ref 43.0–77.0)
Platelets: 328 K/uL (ref 150.0–400.0)
RBC: 4.55 Mil/uL (ref 3.87–5.11)
RDW: 18.6 % — ABNORMAL HIGH (ref 11.5–15.5)
WBC: 8.8 K/uL (ref 4.0–10.5)

## 2024-01-03 LAB — BASIC METABOLIC PANEL WITH GFR
BUN: 11 mg/dL (ref 6–23)
CO2: 29 meq/L (ref 19–32)
Calcium: 9.4 mg/dL (ref 8.4–10.5)
Chloride: 99 meq/L (ref 96–112)
Creatinine, Ser: 0.91 mg/dL (ref 0.40–1.20)
GFR: 65.35 mL/min (ref >60.00)
Glucose, Bld: 158 mg/dL — ABNORMAL HIGH (ref 70–99)
Potassium: 3.4 meq/L — ABNORMAL LOW (ref 3.5–5.1)
Sodium: 138 meq/L (ref 135–145)

## 2024-01-03 LAB — TROPONIN I (HIGH SENSITIVITY): High Sens Troponin I: <2 ng/L (ref 2–17)

## 2024-01-03 LAB — CORTISOL: Cortisol, Plasma: 10.9 ug/dL

## 2024-01-03 LAB — BRAIN NATRIURETIC PEPTIDE: Pro B Natriuretic peptide (BNP): 19 pg/mL (ref 0.0–100.0)

## 2024-01-03 LAB — D-DIMER, QUANTITATIVE: D-Dimer, Quant: 0.61 ug{FEU}/mL — ABNORMAL HIGH (ref <0.50)

## 2024-01-03 MED ORDER — POTASSIUM CHLORIDE ER 10 MEQ PO TBCR: 10.0000 meq | EXTENDED_RELEASE_TABLET | Freq: Two times a day (BID) | ORAL | 0 refills | Status: DC

## 2024-01-03 NOTE — Progress Notes (Unsigned)
 Subjective:  Patient ID: Jaime [name], female    DOB: August 18, 1955  Age: 68 y.o. MRN: [id number]  CC: Anemia and Hypertension   HPI Jaime [name] presents for f/up -----  Discussed the use of AI scribe software for clinical note transcription with the patient, who gave verbal consent to proceed.  History of Present Illness   Jaime [name] is a 68 year old female who presents for a follow-up visit.  She underwent shoulder surgery in April for a spur and had three cysts removed from her hand. She is still in therapy for her shoulder.  Approximately two weeks ago, she experienced gastrointestinal symptoms, including diarrhea and stomach discomfort, which persisted for four days. She sought medical attention and was suspected to have food poisoning. She was prescribed medication, which resolved her symptoms. No nausea, vomiting, fever, chills, dizziness, or lightheadedness during this episode.  She mentions a recent change in her diabetes medication to Synjardy  10/998 mg, which she takes once daily. She reports no symptoms of hyperglycemia such as excessive thirst or urination, and no changes in weight or appetite.  Her blood pressure was recently measured at 96/68 mmHg, which she found unusual. She denies feeling weak, dizzy, or lightheaded, but does report feeling tired. She is currently taking a diuretic, triamterene /hydrochlorothiazide .       Outpatient Medications Prior to Visit  Medication Sig Dispense Refill   aspirin  325 MG tablet Take 325 mg by mouth daily.      atorvastatin  (LIPITOR) 80 MG tablet Take 1 tablet (80 mg total) by mouth daily. 90 tablet 0   fluticasone  (FLONASE ) 50 MCG/ACT nasal spray Place 2 sprays into both nostrils daily. 48 mL 1   gabapentin  (NEURONTIN ) 100 MG capsule Take 100 mg by mouth 3 (three) times daily as needed.     levocetirizine (XYZAL ) 5 MG tablet Take 5 mg by mouth every evening.     Magnesium  250 MG TABS Take 1 tablet (250 mg total) by  mouth daily. 90 tablet 1   Multiple Vitamins-Minerals (MULTIVITAMINS THER. W/MINERALS) TABS Take 1 tablet by mouth daily as needed (nutrition).     pantoprazole  (PROTONIX ) 40 MG tablet TAKE 1 TABLET BY MOUTH EVERY DAY IN THE MORNING 90 tablet 1   SYNJARDY  XR 10-998 MG TB24 TAKE 2 TABLETS BY MOUTH DAILY 30 tablet 23   thiamine (VITAMIN B-1) 50 MG tablet Take 1 tablet (50 mg total) by mouth daily. 90 tablet 1   Cholecalciferol (VITAMIN D ) 125 MCG (5000 UT) CAPS Take by mouth.     triamterene -hydrochlorothiazide  (DYAZIDE) 37.5-25 MG capsule TAKE 1 CAPSULE BY MOUTH EVERY DAY 90 capsule 0   No facility-administered medications prior to visit.    ROS Review of Systems  Objective:  BP 90/82 (BP Location: Left Arm, Patient Position: Sitting, Cuff Size: Normal)   Pulse 86   Temp 98.2 F (36.8 C) (Temporal)   Ht 5' 6 (1.676 m)   Wt 169 lb 6.4 oz (76.8 kg)   SpO2 94%   BMI 27.34 kg/m   BP Readings from Last 3 Encounters:  01/03/24 90/82  10/02/23 (!) 156/94  08/21/23 (!) 161/75    Wt Readings from Last 3 Encounters:  01/03/24 169 lb 6.4 oz (76.8 kg)  10/02/23 175 lb 3.2 oz (79.5 kg)  08/24/23 171 lb (77.6 kg)    Physical Exam Cardiovascular:     Rate and Rhythm: Normal rate and regular rhythm. Occasional Extrasystoles are present.    Heart sounds: Normal heart  sounds, S1 normal and S2 normal. No murmur heard.    Comments: EKG- SR with occasional PVC, 87 bpm LAFB Diffuse T wave changes No LVH Musculoskeletal:     Right lower leg: No edema.     Left lower leg: No edema.     Lab Results  Component Value Date   WBC 8.8 01/03/2024   HGB 11.4 (L) 01/03/2024   HCT 35.9 (L) 01/03/2024   PLT 328.0 01/03/2024   GLUCOSE 158 (H) 01/03/2024   CHOL 143 10/02/2023   TRIG 67.0 10/02/2023   HDL 44.70 10/02/2023   LDLCALC 84 10/02/2023   ALT 11 10/02/2023   AST 24 10/02/2023   NA 138 01/03/2024   K 3.4 (L) 01/03/2024   CL 99 01/03/2024   CREATININE 0.91 01/03/2024   BUN 11  01/03/2024   CO2 29 01/03/2024   TSH 2.22 10/02/2023   HGBA1C 6.6 (H) 10/02/2023   MICROALBUR <0.7 10/02/2023    DG Bone Density Result Date: 08/30/2023 Table formatting from the original result was not included. Date of study: 08/28/2023 Exam: DUAL X-RAY ABSORPTIOMETRY (DXA) FOR BONE MINERAL DENSITY (BMD) Instrument: [employer] Requesting Provider: PCP Indication: follow up for low BMD Comparison: none (please note that it is not possible to compare data from different instruments) Clinical data: Pt is a 68 y.o. female with previous history of fracture. Results:  Lumbar spine L1-L4 Femoral neck (FN) T-score -0.6 RFN: -0.1 LFN: -1.4 Change in BMD from previous DXA test (%) n/a n/a (*) statistically significant Assessment: By the [facility] Criteria for diagnosis based on bone density, this patient has Low Bone Density FRAX 10-year fracture risk calculator: 6.7 % for any major fracture and 0.7 % for hip fracture. Pharmacologic therapy is recommended if 10 year fracture risk is >20% for any major osteoporotic fracture or >3% for hip fracture.  Comments: the technical quality of the study is good. WHO criteria for diagnosis of osteoporosis in postmenopausal women and in men 53 y/o or older: - normal: T-score -1.0 to + 1.0 - osteopenia/low bone density: T-score between -2.5 and -1.0 - osteoporosis: T-score below -2.5 - severe osteoporosis: T-score below -2.5 with history of fragility fracture Note: although not part of the WHO classification, the presence of a fragility fracture, regardless of the T-score, should be considered diagnostic of osteoporosis, provided other causes for the fracture have been excluded. RECOMMENDATION: 1. [name] patients should optimize calcium  and vitamin D  intake. 2. Consider FDA-approved medical therapies in postmenopausal women and men aged 68 years and older, based on the following: a. A hip or vertebral(clinical or morphometric) fracture. b. T-Score of  -2.5 or less at the femoral  neck , total hip or spine after appropriate evaluation to exclude secondary causes c. Low bone mass (T-score between -1.0 and -2.5 at the femoral neck or spine) and a 10 year probability of a hip fracture >3% or a 10 year probability of major osteoporosis-related fracture > 20% based on the [place] -adapted WHO algorithm d. Clinical judgement and/or patient preferences may indicate treatment for people with 10-year fracture probabilities above or below these levels Follow up BMD is recommended: 2 years Interpreted by : Stefano Redgie Butts, MD [facility]    Assessment & Plan:  Hypotension, unspecified hypotension type -     EKG 12-Lead -     Cortisol; Future -     CBC with Differential/Platelet; Future -     Basic metabolic panel with GFR; Future -     Brain natriuretic  peptide; Future -     Troponin I (High Sensitivity); Future -     D-dimer, quantitative; Future -     Urinalysis, Routine w reflex microscopic; Future  Essential hypertension -     CBC with Differential/Platelet; Future -     Basic metabolic panel with GFR; Future -     Urinalysis, Routine w reflex microscopic; Future  Type II diabetes mellitus with manifestations (HCC) -     Basic metabolic panel with GFR; Future -     Urinalysis, Routine w reflex microscopic; Future  Abnormal electrocardiogram (ECG) (EKG) -     Brain natriuretic peptide; Future -     Troponin I (High Sensitivity); Future -     D-dimer, quantitative; Future  Chronic hypokalemia -     Potassium Chloride  ER; Take 1 tablet (10 mEq total) by mouth 2 (two) times daily.  Dispense: 180 tablet; Refill: 0     Follow-up: Return in about 3 months (around 04/04/2024).  Debby Molt, MD

## 2024-01-03 NOTE — Patient Instructions (Signed)

## 2024-01-04 ENCOUNTER — Ambulatory Visit: Payer: Self-pay | Admitting: Internal Medicine

## 2024-01-04 LAB — URINALYSIS, ROUTINE W REFLEX MICROSCOPIC
Bilirubin Urine: NEGATIVE
Hgb urine dipstick: NEGATIVE
Ketones, ur: NEGATIVE
Leukocytes,Ua: NEGATIVE
Nitrite: NEGATIVE
Specific Gravity, Urine: 1.02 (ref 1.000–1.030)
Total Protein, Urine: NEGATIVE
Urine Glucose: >=1000 — AB
Urobilinogen, UA: 0.2 (ref 0.0–1.0)
pH: 6 (ref 5.0–8.0)

## 2024-04-10 ENCOUNTER — Ambulatory Visit: Admitting: Internal Medicine

## 2024-04-10 ENCOUNTER — Encounter: Payer: Self-pay | Admitting: Internal Medicine

## 2024-04-10 VITALS — BP 136/92 | HR 72 | Temp 97.9°F | Resp 16 | Ht 66.0 in | Wt 164.8 lb

## 2024-04-10 DIAGNOSIS — I10 Essential (primary) hypertension: Secondary | ICD-10-CM

## 2024-04-10 DIAGNOSIS — E118 Type 2 diabetes mellitus with unspecified complications: Secondary | ICD-10-CM

## 2024-04-10 DIAGNOSIS — Z23 Encounter for immunization: Secondary | ICD-10-CM

## 2024-04-10 DIAGNOSIS — E519 Thiamine deficiency, unspecified: Secondary | ICD-10-CM

## 2024-04-10 DIAGNOSIS — R9431 Abnormal electrocardiogram [ECG] [EKG]: Secondary | ICD-10-CM

## 2024-04-10 DIAGNOSIS — E1140 Type 2 diabetes mellitus with diabetic neuropathy, unspecified: Secondary | ICD-10-CM

## 2024-04-10 DIAGNOSIS — D538 Other specified nutritional anemias: Secondary | ICD-10-CM | POA: Diagnosis not present

## 2024-04-10 DIAGNOSIS — E876 Hypokalemia: Secondary | ICD-10-CM

## 2024-04-10 DIAGNOSIS — E785 Hyperlipidemia, unspecified: Secondary | ICD-10-CM

## 2024-04-10 DIAGNOSIS — R5383 Other fatigue: Secondary | ICD-10-CM | POA: Diagnosis not present

## 2024-04-10 DIAGNOSIS — M609 Myositis, unspecified: Secondary | ICD-10-CM

## 2024-04-10 DIAGNOSIS — T466X5A Adverse effect of antihyperlipidemic and antiarteriosclerotic drugs, initial encounter: Secondary | ICD-10-CM

## 2024-04-10 LAB — CBC WITH DIFFERENTIAL/PLATELET
Basophils Absolute: 0.1 K/uL (ref 0.0–0.1)
Basophils Relative: 1.2 % (ref 0.0–3.0)
Eosinophils Absolute: 0.3 K/uL (ref 0.0–0.7)
Eosinophils Relative: 2.4 % (ref 0.0–5.0)
HCT: 34.2 % — ABNORMAL LOW (ref 36.0–46.0)
Hemoglobin: 11 g/dL — ABNORMAL LOW (ref 12.0–15.0)
Lymphocytes Relative: 26.1 % (ref 12.0–46.0)
Lymphs Abs: 2.7 K/uL (ref 0.7–4.0)
MCHC: 32.2 g/dL (ref 30.0–36.0)
MCV: 79.4 fl (ref 78.0–100.0)
Monocytes Absolute: 0.8 K/uL (ref 0.1–1.0)
Monocytes Relative: 7.4 % (ref 3.0–12.0)
Neutro Abs: 6.5 K/uL (ref 1.4–7.7)
Neutrophils Relative %: 62.9 % (ref 43.0–77.0)
Platelets: 291 K/uL (ref 150.0–400.0)
RBC: 4.3 Mil/uL (ref 3.87–5.11)
RDW: 18.3 % — ABNORMAL HIGH (ref 11.5–15.5)
WBC: 10.4 K/uL (ref 4.0–10.5)

## 2024-04-10 LAB — MAGNESIUM: Magnesium: 1.7 mg/dL (ref 1.5–2.5)

## 2024-04-10 LAB — TSH: TSH: 1.44 u[IU]/mL (ref 0.35–5.50)

## 2024-04-10 LAB — BASIC METABOLIC PANEL WITH GFR
BUN: 14 mg/dL (ref 6–23)
CO2: 29 meq/L (ref 19–32)
Calcium: 9.2 mg/dL (ref 8.4–10.5)
Chloride: 100 meq/L (ref 96–112)
Creatinine, Ser: 0.93 mg/dL (ref 0.40–1.20)
GFR: 63.55 mL/min (ref >60.00)
Glucose, Bld: 102 mg/dL — ABNORMAL HIGH (ref 70–99)
Potassium: 3.7 meq/L (ref 3.5–5.1)
Sodium: 136 meq/L (ref 135–145)

## 2024-04-10 LAB — HEPATIC FUNCTION PANEL
ALT: 15 U/L (ref 0–35)
AST: 37 U/L (ref 0–37)
Albumin: 4.4 g/dL (ref 3.5–5.2)
Alkaline Phosphatase: 127 U/L — ABNORMAL HIGH (ref 39–117)
Bilirubin, Direct: 0.2 mg/dL (ref 0.0–0.3)
Total Bilirubin: 0.6 mg/dL (ref 0.2–1.2)
Total Protein: 7.6 g/dL (ref 6.0–8.3)

## 2024-04-10 LAB — TROPONIN I (HIGH SENSITIVITY): High Sens Troponin I: <2 ng/L (ref 2–17)

## 2024-04-10 LAB — CK: Total CK: 1481 U/L — ABNORMAL HIGH (ref 17–177)

## 2024-04-10 MED ORDER — VITAMIN B-1 50 MG PO TABS: 50.0000 mg | ORAL_TABLET | Freq: Every day | ORAL | 1 refills | Status: AC

## 2024-04-10 MED ORDER — COVID-19 MRNA VAC-TRIS(PFIZER) 30 MCG/0.3ML IM SUSY: 0.3000 mL | PREFILLED_SYRINGE | Freq: Once | INTRAMUSCULAR | 0 refills | Status: AC

## 2024-04-10 MED ORDER — ATORVASTATIN CALCIUM 80 MG PO TABS
80.0000 mg | ORAL_TABLET | Freq: Every day | ORAL | 0 refills | Status: DC
Start: 2024-04-10 — End: 2024-04-11

## 2024-04-10 MED ORDER — GABAPENTIN 100 MG PO CAPS: 100.0000 mg | ORAL_CAPSULE | Freq: Three times a day (TID) | ORAL | 1 refills | Status: AC

## 2024-04-10 NOTE — Progress Notes (Signed)
 Subjective:  Patient ID: Jaime [name], female    DOB: 08-Apr-1956  Age: 68 y.o. MRN: [id number]  CC: Medical Management of Chronic Issues (3 month follow up. Patient states that she feels tired and sluggish with no energy, she's very cold, she vomited one day last week. Overall don't feel good.  )   HPI TAMORAH HADA presents for f/up ----  Discussed the use of AI scribe software for clinical note transcription with the patient, who gave verbal consent to proceed.  History of Present Illness Jaime [name] is a 68 year old female who presents with fatigue and muscle cramps.  She has been experiencing significant fatigue and lack of energy for the past two to three weeks, feeling sluggish and often sitting for extended periods after getting dressed in the morning. She reports that ever since starting [employer], she does not eat a lot, and she thinks her weight is good.  She reports severe muscle cramps, particularly in her hands and legs, occurring mostly at night. These cramps are intense and painful, requiring assistance from her brother-in-law to remove her coat.  She frequently feels off balance and cold. No dizziness, lightheadedness, chest pain, or shortness of breath. She notes daily ankle swelling, which she attributes to a previous fracture.  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 She feels slow when walking across the parking lot but experiences no chest pain, shortness of breath, cold sweats, or clammy feelings during these activities.  She recalls experiencing sharp chest pain last month, which resolved on its own. The pain was sharp and alarming, but she did not seek medical attention as it subsided.   Outpatient Medications Prior to Visit  Medication Sig Dispense Refill   aspirin  325 MG tablet Take 325 mg by mouth daily.      Multiple Vitamins-Minerals (MULTIVITAMINS THER. W/MINERALS) TABS Take 1 tablet by mouth daily as  needed (nutrition).     pantoprazole  (PROTONIX ) 40 MG tablet TAKE 1 TABLET BY MOUTH EVERY DAY IN THE MORNING 90 tablet 1   potassium chloride  (KLOR-CON  10) 10 MEQ tablet Take 1 tablet (10 mEq total) by mouth 2 (two) times daily. 180 tablet 0   SYNJARDY  XR 10-998 MG TB24 TAKE 2 TABLETS BY MOUTH DAILY (Patient taking differently: Take 1 tablet by mouth daily.) 30 tablet 23   atorvastatin  (LIPITOR) 80 MG tablet Take 1 tablet (80 mg total) by mouth daily. 90 tablet 0   gabapentin  (NEURONTIN ) 100 MG capsule Take 100 mg by mouth 3 (three) times daily as needed.     thiamine (VITAMIN B-1) 50 MG tablet Take 1 tablet (50 mg total) by mouth daily. 90 tablet 1   triamterene -hydrochlorothiazide  (DYAZIDE) 37.5-25 MG capsule Take 1 capsule by mouth daily.     fluticasone  (FLONASE ) 50 MCG/ACT nasal spray Place 2 sprays into both nostrils daily. (Patient not taking: Reported on 04/10/2024) 48 mL 1   Cholecalciferol (VITAMIN D ) 125 MCG (5000 UT) CAPS Take by mouth.     levocetirizine (XYZAL ) 5 MG tablet Take 5 mg by mouth every evening.     Magnesium  250 MG TABS Take 1 tablet (250 mg total) by mouth daily. 90 tablet 1   No facility-administered medications prior to visit.    ROS Review of Systems  Constitutional:  Positive for fatigue. Negative for appetite change, chills, diaphoresis and unexpected weight change.  HENT: Negative.    Respiratory: Negative.  Negative for cough, chest tightness, shortness of  breath and wheezing.   Cardiovascular:  Negative for chest pain, palpitations and leg swelling.  Gastrointestinal: Negative.  Negative for abdominal pain, constipation, diarrhea, nausea and vomiting.  Endocrine: Positive for cold intolerance. Negative for heat intolerance.  Genitourinary:  Negative for difficulty urinating.  Musculoskeletal:  Positive for arthralgias and myalgias.  Skin: Negative.  Negative for color change and pallor.  Neurological:  Negative for dizziness, weakness and  light-headedness.  Hematological:  Negative for adenopathy. Does not bruise/bleed easily.  Psychiatric/Behavioral: Negative.      Objective:  BP (!) 136/92 (BP Location: Right Arm, Patient Position: Sitting, Cuff Size: Normal)   Pulse 72   Temp 97.9 F (36.6 C) (Oral)   Resp 16   Ht 5' 6 (1.676 m)   Wt 164 lb 12.8 oz (74.8 kg)   SpO2 96%   BMI 26.60 kg/m   BP Readings from Last 3 Encounters:  04/10/24 (!) 136/92  01/03/24 90/82  10/02/23 (!) 156/94    Wt Readings from Last 3 Encounters:  04/10/24 164 lb 12.8 oz (74.8 kg)  01/03/24 169 lb 6.4 oz (76.8 kg)  10/02/23 175 lb 3.2 oz (79.5 kg)    Physical Exam Vitals reviewed.  HENT:     Nose: Nose normal.     Mouth/Throat:     Mouth: Mucous membranes are moist.  Eyes:     General: No scleral icterus.    Conjunctiva/sclera: Conjunctivae normal.  Cardiovascular:     Rate and Rhythm: Normal rate and regular rhythm.     Heart sounds: No murmur heard.    No friction rub. No gallop.     Comments: EKG- NSR, 69 bpm LAD NS T wave changes No LVH or Q waves Unchanged  Pulmonary:     Effort: Pulmonary effort is normal.     Breath sounds: No stridor. No wheezing, rhonchi or rales.  Abdominal:     General: Abdomen is protuberant. There is no distension.     Palpations: There is no hepatomegaly, splenomegaly or mass.     Tenderness: There is no abdominal tenderness. There is no guarding.     Hernia: No hernia is present.  Musculoskeletal:     Cervical back: Neck supple.     Right lower leg: No edema.     Left lower leg: No edema.  Skin:    General: Skin is warm and dry.  Neurological:     General: No focal deficit present.     Mental Status: She is alert.  Psychiatric:        Mood and Affect: Mood normal.        Behavior: Behavior normal.     Lab Results  Component Value Date   WBC 10.4 04/10/2024   HGB 11.0 (L) 04/10/2024   HCT 34.2 (L) 04/10/2024   PLT 291.0 04/10/2024   GLUCOSE 102 (H) 04/10/2024   CHOL  143 10/02/2023   TRIG 67.0 10/02/2023   HDL 44.70 10/02/2023   LDLCALC 84 10/02/2023   ALT 15 04/10/2024   AST 37 04/10/2024   NA 136 04/10/2024   K 3.7 04/10/2024   CL 100 04/10/2024   CREATININE 0.93 04/10/2024   BUN 14 04/10/2024   CO2 29 04/10/2024   TSH 1.44 04/10/2024   HGBA1C 6.8 (H) 04/10/2024   MICROALBUR <0.7 10/02/2023    DG Bone Density Result Date: 08/30/2023 Table formatting from the original result was not included. Date of study: 08/28/2023 Exam: DUAL X-RAY ABSORPTIOMETRY (DXA) FOR BONE MINERAL DENSITY (BMD) Instrument:  [employer] Merchant navy officer: PCP Indication: follow up for low BMD Comparison: none (please note that it is not possible to compare data from different instruments) Clinical data: Pt is a 68 y.o. female with previous history of fracture. Results:  Lumbar spine L1-L4 Femoral neck (FN) T-score -0.6 RFN: -0.1 LFN: -1.4 Change in BMD from previous DXA test (%) n/a n/a (*) statistically significant Assessment: By the [facility] Criteria for diagnosis based on bone density, this patient has Low Bone Density FRAX 10-year fracture risk calculator: 6.7 % for any major fracture and 0.7 % for hip fracture. Pharmacologic therapy is recommended if 10 year fracture risk is >20% for any major osteoporotic fracture or >3% for hip fracture.  Comments: the technical quality of the study is good. WHO criteria for diagnosis of osteoporosis in postmenopausal women and in men 63 y/o or older: - normal: T-score -1.0 to + 1.0 - osteopenia/low bone density: T-score between -2.5 and -1.0 - osteoporosis: T-score below -2.5 - severe osteoporosis: T-score below -2.5 with history of fragility fracture Note: although not part of the WHO classification, the presence of a fragility fracture, regardless of the T-score, should be considered diagnostic of osteoporosis, provided other causes for the fracture have been excluded. RECOMMENDATION: 1. [name] patients should optimize calcium  and vitamin D   intake. 2. Consider FDA-approved medical therapies in postmenopausal women and men aged 81 years and older, based on the following: a. A hip or vertebral(clinical or morphometric) fracture. b. T-Score of  -2.5 or less at the femoral neck , total hip or spine after appropriate evaluation to exclude secondary causes c. Low bone mass (T-score between -1.0 and -2.5 at the femoral neck or spine) and a 10 year probability of a hip fracture >3% or a 10 year probability of major osteoporosis-related fracture > 20% based on the [place] -adapted WHO algorithm d. Clinical judgement and/or patient preferences may indicate treatment for people with 10-year fracture probabilities above or below these levels Follow up BMD is recommended: 2 years Interpreted by : Stefano Redgie Butts, MD  Endocrinology    Assessment & Plan:  Type II diabetes mellitus with manifestations (HCC)- Blood sugar is well controlled. -     Hemoglobin A1c; Future -     COVID-19 mRNA Vac-TriS(Pfizer); Inject 0.3 mLs into the muscle once for 1 dose.  Dispense: 0.3 mL; Refill: 0  Anemia due to acquired thiamine deficiency -     CBC with Differential/Platelet; Future -     Vitamin B-1; Take 1 tablet (50 mg total) by mouth daily.  Dispense: 90 tablet; Refill: 1  Need for immunization against influenza -     Flu vaccine HIGH DOSE PF(Fluzone Trivalent)  Essential hypertension- Her BP is not at goal. -     EKG 12-Lead -     TSH; Future -     CK; Future -     Basic metabolic panel with GFR; Future -     Triamterene -HCTZ; Take 1 each (1 capsule total) by mouth daily.  Dispense: 90 capsule; Refill: 0 -     AMB Referral [facility]BCI Care Management  Hypokalemia -     Magnesium ; Future -     Basic metabolic panel with GFR; Future  Hyperlipidemia LDL goal <130 -     TSH; Future -     CK; Future -     Hepatic function panel; Future -     AMB Referral [facility]BCI Care Management  Abnormal electrocardiogram (ECG) (EKG) -     Troponin  I (High  Sensitivity); Future  Other fatigue -     Troponin I (High Sensitivity); Future -     Hepatic function panel; Future  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) -     Gabapentin ; Take 1 capsule (100 mg total) by mouth 3 (three) times daily.  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 -     CK; Future     Follow-up: Return in about 3 months (around 07/11/2024).  Debby Molt, MD

## 2024-04-10 NOTE — Patient Instructions (Signed)
 Hypertension, Adult High blood pressure (hypertension) is when the force of blood pumping through the arteries is too strong. The arteries are the blood vessels that carry blood from the heart throughout the body. Hypertension forces the heart to work harder to pump blood and may cause arteries to become narrow or stiff. Untreated or uncontrolled hypertension can lead to a heart attack, heart failure, a stroke, kidney disease, and other problems. A blood pressure reading consists of a higher number over a lower number. Ideally, your blood pressure should be below 120/80. The first ("top") number is called the systolic pressure. It is a measure of the pressure in your arteries as your heart beats. The second ("bottom") number is called the diastolic pressure. It is a measure of the pressure in your arteries as the heart relaxes. What are the causes? The exact cause of this condition is not known. There are some conditions that result in high blood pressure. What increases the risk? Certain factors may make you more likely to develop high blood pressure. Some of these risk factors are under your control, including: Smoking. Not getting enough exercise or physical activity. Being overweight. Having too much fat, sugar, calories, or salt (sodium) in your diet. Drinking too much alcohol. Other risk factors include: Having a personal history of heart disease, diabetes, high cholesterol, or kidney disease. Stress. Having a family history of high blood pressure and high cholesterol. Having obstructive sleep apnea. Age. The risk increases with age. What are the signs or symptoms? High blood pressure may not cause symptoms. Very high blood pressure (hypertensive crisis) may cause: Headache. Fast or irregular heartbeats (palpitations). Shortness of breath. Nosebleed. Nausea and vomiting. Vision changes. Severe chest pain, dizziness, and seizures. How is this diagnosed? This condition is diagnosed by  measuring your blood pressure while you are seated, with your arm resting on a flat surface, your legs uncrossed, and your feet flat on the floor. The cuff of the blood pressure monitor will be placed directly against the skin of your upper arm at the level of your heart. Blood pressure should be measured at least twice using the same arm. Certain conditions can cause a difference in blood pressure between your right and left arms. If you have a high blood pressure reading during one visit or you have normal blood pressure with other risk factors, you may be asked to: Return on a different day to have your blood pressure checked again. Monitor your blood pressure at home for 1 week or longer. If you are diagnosed with hypertension, you may have other blood or imaging tests to help your health care provider understand your overall risk for other conditions. How is this treated? This condition is treated by making healthy lifestyle changes, such as eating healthy foods, exercising more, and reducing your alcohol intake. You may be referred for counseling on a healthy diet and physical activity. Your health care provider may prescribe medicine if lifestyle changes are not enough to get your blood pressure under control and if: Your systolic blood pressure is above 130. Your diastolic blood pressure is above 80. Your personal target blood pressure may vary depending on your medical conditions, your age, and other factors. Follow these instructions at home: Eating and drinking  Eat a diet that is high in fiber and potassium, and low in sodium, added sugar, and fat. An example of this eating plan is called the DASH diet. DASH stands for Dietary Approaches to Stop Hypertension. To eat this way: Eat  plenty of fresh fruits and vegetables. Try to fill one half of your plate at each meal with fruits and vegetables. Eat whole grains, such as whole-wheat pasta, brown rice, or whole-grain bread. Fill about one  fourth of your plate with whole grains. Eat or drink low-fat dairy products, such as skim milk or low-fat yogurt. Avoid fatty cuts of meat, processed or cured meats, and poultry with skin. Fill about one fourth of your plate with lean proteins, such as fish, chicken without skin, beans, eggs, or tofu. Avoid pre-made and processed foods. These tend to be higher in sodium, added sugar, and fat. Reduce your daily sodium intake. Many people with hypertension should eat less than 1,500 mg of sodium a day. Do not drink alcohol if: Your health care provider tells you not to drink. You are pregnant, may be pregnant, or are planning to become pregnant. If you drink alcohol: Limit how much you have to: 0-1 drink a day for women. 0-2 drinks a day for men. Know how much alcohol is in your drink. In the [place] one drink equals one 12 oz bottle of beer (355 mL), one 5 oz glass of wine (148 mL), or one 1 oz glass of hard liquor (44 mL). Lifestyle  Work with your health care provider to maintain a healthy body weight or to lose weight. Ask what an ideal weight is for you. Get at least 30 minutes of exercise that causes your heart to beat faster (aerobic exercise) most days of the week. Activities may include walking, swimming, or biking. Include exercise to strengthen your muscles (resistance exercise), such as Pilates or lifting weights, as part of your weekly exercise routine. Try to do these types of exercises for 30 minutes at least 3 days a week. Do not use any products that contain nicotine or tobacco. These products include cigarettes, chewing tobacco, and vaping devices, such as e-cigarettes. If you need help quitting, ask your health care provider. Monitor your blood pressure at home as told by your health care provider. Keep all follow-up visits. This is important. Medicines Take over-the-counter and prescription medicines only as told by your health care provider. Follow directions carefully. Blood  pressure medicines must be taken as prescribed. Do not skip doses of blood pressure medicine. Doing this puts you at risk for problems and can make the medicine less effective. Ask your health care provider about side effects or reactions to medicines that you should watch for. Contact a health care provider if you: Think you are having a reaction to a medicine you are taking. Have headaches that keep coming back (recurring). Feel dizzy. Have swelling in your ankles. Have trouble with your vision. Get help right away if you: Develop a severe headache or confusion. Have unusual weakness or numbness. Feel faint. Have severe pain in your chest or abdomen. Vomit repeatedly. Have trouble breathing. These symptoms may be an emergency. Get help right away. Call 911. Do not wait to see if the symptoms will go away. Do not drive yourself to the hospital. Summary Hypertension is when the force of blood pumping through your arteries is too strong. If this condition is not controlled, it may put you at risk for serious complications. Your personal target blood pressure may vary depending on your medical conditions, your age, and other factors. For most people, a normal blood pressure is less than 120/80. Hypertension is treated with lifestyle changes, medicines, or a combination of both. Lifestyle changes include losing weight, eating a healthy,  low-sodium diet, exercising more, and limiting alcohol. This information is not intended to replace advice given to you by your health care provider. Make sure you discuss any questions you have with your health care provider. Document Revised: 04/13/2021 Document Reviewed: 04/13/2021 Elsevier Patient Education  2024 [employer].

## 2024-04-11 ENCOUNTER — Ambulatory Visit: Payer: Self-pay | Admitting: Internal Medicine

## 2024-04-11 DIAGNOSIS — T466X5A Adverse effect of antihyperlipidemic and antiarteriosclerotic drugs, initial encounter: Secondary | ICD-10-CM | POA: Insufficient documentation

## 2024-04-11 LAB — HEMOGLOBIN A1C: Hgb A1c MFr Bld: 6.8 % — ABNORMAL HIGH (ref 4.6–6.5)

## 2024-04-11 MED ORDER — TRIAMTERENE-HCTZ 37.5-25 MG PO CAPS: 1.0000 | ORAL_CAPSULE | Freq: Every day | ORAL | 0 refills | Status: DC

## 2024-04-16 ENCOUNTER — Telehealth: Payer: Self-pay | Admitting: *Deleted

## 2024-04-16 NOTE — Progress Notes (Signed)
 Care Guide Pharmacy Note  04/16/2024 Name: Jaime [name] MRN: [id number] DOB: Jun 30, 1955  Referred By: Joshua Debby CROME, MD Reason for referral: Complex Care Management (Outreach to schedule referral with pharmacist )   Jaime [name] is a 68 y.o. year old female who is a primary care patient of Joshua Debby CROME, MD.  Jaime [name] was referred to the pharmacist for assistance related to: HTN and DMII  Successful contact was made with the patient to discuss pharmacy services including being ready for the pharmacist to call at least 5 minutes before the scheduled appointment time and to have medication bottles and any blood pressure readings ready for review. The patient agreed to meet with the pharmacist via telephone visit on 04/25/2024  Thedford Franks, CMA   [facility], [facility] Guide Direct Dial: [phone]  Fax: [phone] Website: [email or web address]

## 2024-04-17 ENCOUNTER — Other Ambulatory Visit

## 2024-04-17 DIAGNOSIS — T466X5A Adverse effect of antihyperlipidemic and antiarteriosclerotic drugs, initial encounter: Secondary | ICD-10-CM

## 2024-04-17 DIAGNOSIS — M609 Myositis, unspecified: Secondary | ICD-10-CM

## 2024-04-17 LAB — CK: Total CK: 125 U/L (ref 17–177)

## 2024-04-20 ENCOUNTER — Other Ambulatory Visit: Payer: Self-pay | Admitting: Internal Medicine

## 2024-04-20 DIAGNOSIS — E1140 Type 2 diabetes mellitus with diabetic neuropathy, unspecified: Secondary | ICD-10-CM

## 2024-04-20 DIAGNOSIS — I739 Peripheral vascular disease, unspecified: Secondary | ICD-10-CM

## 2024-04-20 MED ORDER — ATORVASTATIN CALCIUM 20 MG PO TABS: 20.0000 mg | ORAL_TABLET | Freq: Every day | ORAL | 0 refills | Status: DC

## 2024-04-25 ENCOUNTER — Other Ambulatory Visit: Admitting: Pharmacist

## 2024-04-25 DIAGNOSIS — E785 Hyperlipidemia, unspecified: Secondary | ICD-10-CM

## 2024-04-25 DIAGNOSIS — I10 Essential (primary) hypertension: Secondary | ICD-10-CM

## 2024-04-25 DIAGNOSIS — E118 Type 2 diabetes mellitus with unspecified complications: Secondary | ICD-10-CM

## 2024-04-25 NOTE — Progress Notes (Signed)
 04/25/2024 Name: Jaime [name] MRN: [id number] DOB: [birth year]  Chief Complaint  Patient presents with   Diabetes   Hypertension   Hyperlipidemia   Medication Management    Jaime [name] is a 68 y.o. year old female who presented for a telephone visit.   They were referred to the pharmacist by their PCP for assistance in managing diabetes, hypertension, and hyperlipidemia/cardiovascular risk reduction.    Subjective:  Care Team: Primary Care Provider: Joshua Debby CROME, MD ; Next Scheduled Visit: 07/10/24   Medication Access/Adherence  Current Pharmacy:  [id number] - [place], [place] - [address] AT [facility] [address] [place] [address] Phone: [phone] Fax: [phone]  [facility] - [place], [place] - [address] [place] [address] Phone: [phone] Fax: [phone]   Patient reports affordability concerns with their medications: No  Patient reports access/transportation concerns to their pharmacy: No  Patient reports adherence concerns with their medications:  No     Diabetes:  Current medications: Synjardy  XR 10/998 mg daily Medications tried in the past: none  Macrovascular and Microvascular Risk Reduction:  Statin? yes (atorvastatin  20 mg); ACEi/ARB? no; therapy not indicated  Last urinary albumin/creatinine ratio:  Lab Results  Component Value Date   MICRALBCREAT Unable to calculate 10/02/2023   MICRALBCREAT 7 03/02/2020   Last eye exam:  Lab Results  Component Value Date   HMDIABEYEEXA No Retinopathy 04/26/2022   Last foot exam: 04/10/2024 Tobacco Use:  Tobacco Use: Medium Risk (04/10/2024)   Patient History    Smoking Tobacco Use: Former    Smokeless Tobacco Use: Never    Passive Exposure: Past   Hypertension:  Current medications: triamterene /hydrochlorothiazide  37.5/25 mg daily Medications previously tried:    Patient has a validated, automated, upper arm home BP cuff Current blood pressure readings readings: none recent  Patient denies hypotensive s/sx including dizziness, lightheadedness.  Patient denies hypertensive symptoms including headache, chest pain, shortness of breath  Hyperlipidemia/ASCVD Risk Reduction  Current lipid lowering medications: atorvastatin  20 mg daily (just started lower dose yesterday) Medications tried in the past: atorvastatin  80 mg (myalgias)  Antiplatelet regimen: aspirin  325 mg  Elevated CK on atorvastatin  80 mg, which resolved   Objective:  Lab Results  Component Value Date   HGBA1C 6.8 (H) 04/10/2024    Lab Results  Component Value Date   CREATININE 0.93 04/10/2024   BUN 14 04/10/2024   NA 136 04/10/2024   K 3.7 04/10/2024   CL 100 04/10/2024   CO2 29 04/10/2024    Lab Results  Component Value Date   CHOL 143 10/02/2023   HDL 44.70 10/02/2023   LDLCALC 84 10/02/2023   TRIG 67.0 10/02/2023   CHOLHDL 3 10/02/2023    Medications Reviewed Today   Medications were not reviewed in this encounter       Assessment/Plan:   Diabetes: - Currently controlled; goal A1c <7%. Cardiorenal risk reduction is optimized.. Blood pressure is not at goal <130/80. LDL is not at goal.  - Recommend to continue Synjardy .   Hypertension: - Currently uncontrolled, BP goal <130/80 - Reviewed long term cardiovascular and renal outcomes of uncontrolled blood pressure - Reviewed appropriate blood pressure monitoring technique and reviewed goal blood pressure. Recommended to check home blood pressure and heart rate  - Recommend to continue current regimen, call back in 1 week to review home BP readings     Hyperlipidemia/ASCVD Risk Reduction: -  Currently uncontrolled. LDL goal <55 - Reviewed long term complications of uncontrolled cholesterol - Recommend to continue atorvastatin  20 mg daily, see how it is tolerated     Follow Up Plan:  11/13  Jaime [name], PharmD, BCPS, CPP Clinical Pharmacist Practitioner [facility] at [facility] [phone]

## 2024-04-26 ENCOUNTER — Ambulatory Visit: Attending: Cardiology | Admitting: Cardiology

## 2024-04-26 VITALS — BP 137/85 | HR 65 | Ht 66.5 in | Wt 172.2 lb

## 2024-04-26 DIAGNOSIS — I779 Disorder of arteries and arterioles, unspecified: Secondary | ICD-10-CM

## 2024-04-26 DIAGNOSIS — R9431 Abnormal electrocardiogram [ECG] [EKG]: Secondary | ICD-10-CM | POA: Diagnosis not present

## 2024-04-26 DIAGNOSIS — I444 Left anterior fascicular block: Secondary | ICD-10-CM

## 2024-04-26 NOTE — Patient Instructions (Signed)
 Medication Instructions:  Your physician recommends that you continue on your current medications as directed. Please refer to the Current Medication list given to you today.  *If you need a refill on your cardiac medications before your next appointment, please call your pharmacy*  Lab Work: None ordered.  If you have labs (blood work) drawn today and your tests are completely normal, you will receive your results only by: MyChart Message (if you have MyChart) OR A paper copy in the mail If you have any lab test that is abnormal or we need to change your treatment, we will call you to review the results.  Testing/Procedures: Your physician has requested that you have an echocardiogram. Echocardiography is a painless test that uses sound waves to create images of your heart. It provides your doctor with information about the size and shape of your heart and how well your heart's chambers and valves are working. This procedure takes approximately one hour. There are no restrictions for this procedure. Please do NOT wear cologne, perfume, aftershave, or lotions (deodorant is allowed). Please arrive 15 minutes prior to your appointment time.  Please note: We ask at that you not bring children with you during ultrasound (echo/ vascular) testing. Due to room size and safety concerns, children are not allowed in the ultrasound rooms during exams. Our front office staff cannot provide observation of children in our lobby area while testing is being conducted. An adult accompanying a patient to their appointment will only be allowed in the ultrasound room at the discretion of the ultrasound technician under special circumstances. We apologize for any inconvenience.  Your physician has requested that you have a carotid dopplers.   Follow-Up: At [facility], you and your health needs are our priority.  As part of our continuing mission to provide you with exceptional heart care, our providers  are all part of one team.  This team includes your primary Cardiologist (physician) and Advanced Practice Providers or APPs (Physician Assistants and Nurse Practitioners) who all work together to provide you with the care you need, when you need it.  Your next appointment:   12 months with Dr Jeffrie  We recommend signing up for the patient portal called MyChart.  Sign up information is provided on this After Visit Summary.  MyChart is used to connect with patients for Virtual Visits (Telemedicine).  Patients are able to view lab/test results, encounter notes, upcoming appointments, etc.  Non-urgent messages can be sent to your provider as well.   To learn more about what you can do with MyChart, go to [email or web address].

## 2024-04-26 NOTE — Progress Notes (Signed)
 Cardiology Office Note:  .   Date:  04/26/2024  ID:  Jaime [name], DOB Dec 17, 1955, MRN [id number] PCP: Joshua Debby CROME, MD  [facility] Providers Cardiologist:  Powell FORBES Sorrow, MD (Inactive)     History of Present Illness: .   Jaime [name] is a 68 y.o. female Discussed the use of AI scribe   History of Present Illness Jaime [name] is a 68 year old female with carotid artery disease who presents for evaluation of abnormal EKG findings.  She has experienced three abnormal EKGs, raising concerns about her cardiac health. Recently, she experienced chest pain described as a hurting sensation on Wednesday night. Despite her daughter's offer to drive her, she declined assistance, feeling she was okay.  Her medical history includes carotid artery disease, with a carotid ultrasound in 2013 revealing an occluded right carotid artery. She is currently on aspirin  as prescribed by neurology. She also takes atorvastatin  40 mg for hyperlipidemia, aiming for an LDL goal of less than 70, with her last LDL recorded at 84 in April 2025.  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  Her past medical history also includes hypertension, GERD, peptic ulcer disease, and a history of acute appendicitis in 2022, which was managed with IV antibiotics and surgery. An echocardiogram in 2021 showed an ejection fraction of 65% with grade one diastolic dysfunction.  Socially, she works financial trader parts, which involves driving to various locations. She retired in 2010 from [employer] and previously cared for her mother, who had congestive heart failure. I took care of her mother.      Studies Reviewed: .        Results LABS Hemoglobin: 11 Hemoglobin A1c: 6.8 TSH: 1.4  RADIOLOGY Coronary CT scan: Calcium  score of zero with normal coronary  arteries, small 3 mm pulmonary nodule, low risk (02/23/2021) Vascular ultrasound: No evidence of deep vein thrombosis upper extremity (08/21/2023) Carotid ultrasound: Occluded right carotid artery, no major plaque in the left (2013) MRI of the brain: No significant change in previously treated meningioma centered in the right cavernous sinus, measures 2.6 x 1.6 cm (07/2023)  DIAGNOSTIC Echocardiogram: Ejection fraction 65% with grade one diastolic dysfunction (2021) EKG: Left anterior fascicular block Risk Assessment/Calculations:            Physical Exam:   VS:  BP 137/85 (BP Location: Right Arm, Patient Position: Sitting, Cuff Size: Normal)   Pulse 65   Ht 5' 6.5 (1.689 m)   Wt 172 lb 3.2 oz (78.1 kg)   SpO2 95%   BMI 27.38 kg/m    Wt Readings from Last 3 Encounters:  04/26/24 172 lb 3.2 oz (78.1 kg)  04/10/24 164 lb 12.8 oz (74.8 kg)  01/03/24 169 lb 6.4 oz (76.8 kg)    GEN: Well nourished, well developed in no acute distress NECK: No JVD; Left carotid bruit CARDIAC: RRR, no murmurs, no rubs, no gallops RESPIRATORY:  Clear to auscultation without rales, wheezing or rhonchi  ABDOMEN: Soft, non-tender, non-distended EXTREMITIES:  No edema; No deformity   ASSESSMENT AND PLAN: .    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 A bruit is present on the left side, indicating possible changes. - Ordered carotid Doppler ultrasound to  assess current status of carotid artery disease. Left Bruit.   Left anterior fascicular block Identified on EKG, indicating an electrical change in the heart. It is not associated with blocked blood vessels or increased risk of myocardial infarction. - Ordered echocardiogram to assess cardiac function and stability.   Hyperlipidemia Managed with atorvastatin  40 mg. LDL goal is less than 70 mg/dL. Previous LDL in April  2025 was 84 mg/dL. - Continue atorvastatin  40 mg daily.         Dispo: 1 yr  Signed, Oneil Parchment, MD

## 2024-04-30 ENCOUNTER — Ambulatory Visit (HOSPITAL_COMMUNITY)
Admission: RE | Admit: 2024-04-30 | Discharge: 2024-04-30 | Disposition: A | Discharge status: Home / Self Care | Source: Ambulatory Visit | Attending: Cardiology | Admitting: Cardiology

## 2024-04-30 DIAGNOSIS — I6521 Occlusion and stenosis of right carotid artery: Secondary | ICD-10-CM | POA: Diagnosis not present

## 2024-04-30 DIAGNOSIS — I779 Disorder of arteries and arterioles, unspecified: Secondary | ICD-10-CM | POA: Insufficient documentation

## 2024-05-01 ENCOUNTER — Ambulatory Visit: Payer: Self-pay | Admitting: Cardiology

## 2024-05-02 ENCOUNTER — Other Ambulatory Visit: Admitting: Pharmacist

## 2024-05-02 VITALS — BP 125/80

## 2024-05-02 DIAGNOSIS — E118 Type 2 diabetes mellitus with unspecified complications: Secondary | ICD-10-CM

## 2024-05-02 DIAGNOSIS — I10 Essential (primary) hypertension: Secondary | ICD-10-CM

## 2024-05-02 DIAGNOSIS — E785 Hyperlipidemia, unspecified: Secondary | ICD-10-CM

## 2024-05-02 NOTE — Patient Instructions (Signed)
 It was a pleasure speaking with you today!  Continue current regimen. Continue monitoring blood pressure at home.  Feel free to call with any questions or concerns!  Darrelyn Drum, PharmD, BCPS, CPP Clinical Pharmacist Practitioner [facility] at [facility] [phone]

## 2024-05-02 NOTE — Progress Notes (Signed)
 05/02/24  Name: Jaime [name] MRN: [id number] DOB: [birth year]  Chief Complaint  Patient presents with   Diabetes   Hypertension   Hyperlipidemia   Medication Management    Jaime [name] is a 68 y.o. year old female who presented for a telephone visit.   They were referred to the pharmacist by their PCP for assistance in managing diabetes, hypertension, and hyperlipidemia/cardiovascular risk reduction.    Subjective:  Care Team: Primary Care Provider: Joshua Debby CROME, MD ; Next Scheduled Visit: 07/10/24   Medication Access/Adherence  Current Pharmacy:  [id number] - [place], [place] - [address] AT [facility] [address] [place] [address] Phone: [phone] Fax: [phone]  [facility] - [place], [place] - [address] [place] [address] Phone: [phone] Fax: [phone]   Patient reports affordability concerns with their medications: No  Patient reports access/transportation concerns to their pharmacy: No  Patient reports adherence concerns with their medications:  No    Diabetes:  Current medications: Synjardy  XR 10/998 mg daily (prescribed twice daily, taking once daily) Medications tried in the past: none  Macrovascular and Microvascular Risk Reduction:  Statin? yes (atorvastatin  20 mg); ACEi/ARB? no; therapy not indicated  Last urinary albumin/creatinine ratio:  Lab Results  Component Value Date   MICRALBCREAT Unable to calculate 10/02/2023   MICRALBCREAT 7 03/02/2020   Last eye exam:  Lab Results  Component Value Date   HMDIABEYEEXA No Retinopathy 04/26/2022   Last foot exam: 04/10/2024 Tobacco Use:  Tobacco Use: Medium Risk (05/01/2024)   Received from [facility]   Patient History    Smoking Tobacco Use: Former    Smokeless Tobacco Use: Never    Passive Exposure: Not on file   Hypertension:  Current medications:  triamterene /hydrochlorothiazide  37.5/25 mg daily Medications previously tried: amlodipine , atenolol , enalapril , hydrochlorothiazide , losartan , nebivolol , metoprolol  succinate  Patient has a validated, automated, upper arm home BP cuff Current blood pressure readings readings:  11/7 127/70, (78) 11/8 156/90 (72) 11/9 121/84 (78) 11/10 125/83 (77) 11/11 127/79 (68) 11/12 132/85 (70) 11/13 AM 125/80 (69)  Patient denies hypotensive s/sx including dizziness, lightheadedness.  Patient denies hypertensive symptoms including headache, chest pain, shortness of breath  Hyperlipidemia/ASCVD Risk Reduction  Current lipid lowering medications: atorvastatin  20 mg daily Medications tried in the past: atorvastatin  80 mg (myalgias)  Antiplatelet regimen: aspirin  325 mg  Elevated CK on atorvastatin  80 mg, which resolved  No myalgias or cramping since starting atorvastatin  20 mg   Objective:  Lab Results  Component Value Date   HGBA1C 6.8 (H) 04/10/2024    Lab Results  Component Value Date   CREATININE 0.93 04/10/2024   BUN 14 04/10/2024   NA 136 04/10/2024   K 3.7 04/10/2024   CL 100 04/10/2024   CO2 29 04/10/2024    Lab Results  Component Value Date   CHOL 143 10/02/2023   HDL 44.70 10/02/2023   LDLCALC 84 10/02/2023   TRIG 67.0 10/02/2023   CHOLHDL 3 10/02/2023    Medications Reviewed Today     Reviewed by Merceda Lela JONELLE, RPH (Pharmacist) on 05/02/24 at 1612  Med List Status: <None>   Medication Order Taking? Sig Documenting Provider Last Dose Status Informant  aspirin  325 MG tablet [id number] Yes Take 325 mg by mouth daily.  [provider]  Active Self  atorvastatin  (LIPITOR) 20 MG tablet [id number] Yes Take 1 tablet (20 mg total) by  mouth daily. Joshua Debby CROME, MD  Active   fluticasone  (FLONASE ) 50 MCG/ACT nasal spray [id number]  Place 2 sprays into both nostrils daily.  Patient not taking: Reported on 04/26/2024   Joshua Debby CROME, MD  Active   gabapentin   (NEURONTIN ) 100 MG capsule [id number]  Take 1 capsule (100 mg total) by mouth 3 (three) times daily. Joshua Debby CROME, MD  Active   Multiple Vitamins-Minerals (MULTIVITAMINS THER. W/MINERALS) CAMILLIA [id number]  Take 1 tablet by mouth daily as needed (nutrition). [provider]  Active Self  pantoprazole  (PROTONIX ) 40 MG tablet [id number]  TAKE 1 TABLET BY MOUTH EVERY DAY IN THE MORNING Joshua Debby CROME, MD  Active   potassium chloride  (KLOR-CON  10) 10 MEQ tablet [id number]  Take 1 tablet (10 mEq total) by mouth 2 (two) times daily. Joshua Debby CROME, MD  Active   SYNJARDY  XR 10-998 MG TB24 [id number] Yes TAKE 2 TABLETS BY MOUTH DAILY  Patient taking differently: Take 1 tablet by mouth daily.   Joshua Debby CROME, MD  Active   thiamine (VITAMIN B-1) 50 MG tablet [id number]  Take 1 tablet (50 mg total) by mouth daily. Joshua Debby CROME, MD  Active   triamterene -hydrochlorothiazide  (DYAZIDE) 37.5-25 MG capsule [id number] Yes Take 1 each (1 capsule total) by mouth daily. Joshua Debby CROME, MD  Active             Assessment/Plan:   Diabetes: - Currently controlled; goal A1c <7%. Cardiorenal risk reduction is optimized.. Blood pressure is not at goal <130/80. LDL is not at goal.  - Recommend to continue Synjardy .  - If next A1c is above goal, recommend Synjardy  XR 25/1000 mg daily   Hypertension: - Currently controlled, BP goal <130/80 - Reviewed long term cardiovascular and renal outcomes of uncontrolled blood pressure - Reviewed appropriate blood pressure monitoring technique and reviewed goal blood pressure. Recommended to check home blood pressure and heart rate  - Recommend to continue current regimen   Hyperlipidemia/ASCVD Risk Reduction: - Currently uncontrolled. LDL goal <55 - Reviewed long term complications of uncontrolled cholesterol - Recommend to continue atorvastatin  20 mg daily, see how it is tolerated    Follow Up Plan: PCP f/u 1/21, f/u A1c result  Darrelyn Drum, PharmD, BCPS,  CPP Clinical Pharmacist Practitioner [facility] [phone]

## 2024-06-03 ENCOUNTER — Ambulatory Visit (HOSPITAL_COMMUNITY): Admission: RE | Admit: 2024-06-03 | Discharge: 2024-06-03 | Attending: Cardiology | Admitting: Cardiology

## 2024-06-03 DIAGNOSIS — R9431 Abnormal electrocardiogram [ECG] [EKG]: Secondary | ICD-10-CM

## 2024-06-03 LAB — ECHOCARDIOGRAM COMPLETE
Area-P 1/2: 2.37 cm2
S' Lateral: 2.8 cm

## 2024-06-17 ENCOUNTER — Other Ambulatory Visit: Payer: Self-pay | Admitting: Internal Medicine

## 2024-06-17 DIAGNOSIS — I10 Essential (primary) hypertension: Secondary | ICD-10-CM

## 2024-06-21 NOTE — Progress Notes (Signed)
 " [facility] - Clinic Note  07/02/2024   CHIEF COMPLAINT Patient presents for Retina Evaluation  HISTORY OF PRESENT ILLNESS: Jaime [name] is a 69 y.o. female who presents to the clinic today for:  HPI     Retina Evaluation   This started 1 week ago.  I, the attending physician,  performed the HPI with the patient and updated documentation appropriately.        Comments   Pt is here for an evaluation for ERM VS Schisis OS- Pt saw Dr. GORMAN Gaudy last week. Pt had CAT SX OD last Friday. Pt denies any changes in [place]. Pt c/o a new floater OS- this morning, gone now. Pt denies FOL. Pt is on eyedrops for OD CAT SX.       Last edited by Valdemar Rogue, MD on 07/09/2024  2:31 AM.     Pt states  Referring physician: Gaudy Charlie Hamilton, MD [address] [place],  [place] [address]  HISTORICAL INFORMATION:  Selected notes from the MEDICAL RECORD NUMBER Referred by Dr. Gaudy for retina eval -- ERM with foveoschisis OS LEE:  Ocular Hx- PMH-   CURRENT MEDICATIONS: No current outpatient medications on file. (Ophthalmic Drugs)   No current facility-administered medications for this visit. (Ophthalmic Drugs)   Current Outpatient Medications (Other)  Medication Sig   aspirin  325 MG tablet Take 325 mg by mouth daily.    atorvastatin  (LIPITOR) 20 MG tablet TAKE 1 TABLET BY MOUTH DAILY   fluticasone  (FLONASE ) 50 MCG/ACT nasal spray Place 2 sprays into both nostrils daily. (Patient not taking: Reported on 04/26/2024)   gabapentin  (NEURONTIN ) 100 MG capsule Take 1 capsule (100 mg total) by mouth 3 (three) times daily.   Multiple Vitamins-Minerals (MULTIVITAMINS THER. W/MINERALS) TABS Take 1 tablet by mouth daily as needed (nutrition).   pantoprazole  (PROTONIX ) 40 MG tablet TAKE 1 TABLET BY MOUTH EVERY DAY IN THE MORNING   potassium chloride  (KLOR-CON  10) 10 MEQ tablet Take 1 tablet (10 mEq total) by mouth 2 (two) times daily.   SYNJARDY  XR 10-998 MG TB24 TAKE 2 TABLETS  BY MOUTH DAILY (Patient taking differently: Take 1 tablet by mouth daily.)   thiamine (VITAMIN B-1) 50 MG tablet Take 1 tablet (50 mg total) by mouth daily.   triamterene -hydrochlorothiazide  (DYAZIDE) 37.5-25 MG capsule TAKE 1 CAPSULE BY MOUTH DAILY   No current facility-administered medications for this visit. (Other)   REVIEW OF SYSTEMS:  ALLERGIES Allergies[1] PAST MEDICAL HISTORY Past Medical History:  Diagnosis Date   Anemia    Arthritis    Cataract    Diabetes mellitus without complication (HCC)    Dyspnea    with exertion    Endometriosis    GERD (gastroesophageal reflux disease)    Hyperlipidemia    Hypertension    Meningioma (HCC)    Nontoxic multinodular goiter    Occlusion and stenosis of carotid artery without mention of cerebral infarction    Peptic ulcer disease    PVD (peripheral vascular disease)    Past Surgical History:  Procedure Laterality Date   CT RADIATION THERAPY GUIDE     CT done yearly for menigioma with radiation if needed   CYST EXCISION     head   CYST EXCISION Left    x 2    cyst removed     left wrist   endometriosis surgery      LAPAROSCOPIC APPENDECTOMY N/A 11/02/2020   Procedure: APPENDECTOMY LAPAROSCOPIC;  Surgeon: Signe,  Mitzie LABOR, MD;  Location: [facility];  Service: General;  Laterality: N/A;   ORIF ANKLE FRACTURE     right   OVARIAN CYST REMOVAL     right   SHOULDER SURGERY Right    Spur removal   TONSILLECTOMY  1965   TRIGGER FINGER RELEASE  2012   right thumb   TUBAL LIGATION     VIDEO BRONCHOSCOPY  11/10/2011   Procedure: VIDEO BRONCHOSCOPY WITH FLUORO;  Surgeon: Ozell KATHEE America, MD;  Location: [facility];  Service: Cardiopulmonary;  Laterality: Bilateral;   FAMILY HISTORY Family History  Problem Relation Age of Onset   Hypertension Mother    Diabetes Mother    Heart disease Mother    Heart failure Mother    Dementia Mother    Lung cancer Mother    Esophageal cancer Father        was a smoker   Colon cancer  Maternal Grandmother    Breast cancer Neg Hx    Coronary artery disease Neg Hx    Rectal cancer Neg Hx    Stomach cancer Neg Hx    SOCIAL HISTORY Social History[2]     OPHTHALMIC EXAM:  Base Eye Exam     Visual Acuity (Snellen - Linear)       Right Left   Dist cc 20/70 20/20 -3   Dist ph cc 20/25 -3     Correction: Glasses         Tonometry (Tonopen, 3:22 PM)       Right Left   Pressure 14 16         Pachymetry (07/02/2024)       Right Left   Thickness 17 15         Pupils       Pupils Dark Light Shape React APD   Right PERRL 3 2 Round Brisk None   Left PERRL 3 2 Round Brisk None         Visual Fields       Left Right    Full Full         Extraocular Movement       Right Left    Full, Ortho Full, Ortho         Neuro/Psych     Oriented x3: Yes         Dilation     Both eyes: 1.0% Mydriacyl, 2.5% Phenylephrine  @ 2:03 PM           Slit Lamp and Fundus Exam     Slit Lamp Exam       Right Left   Lids/Lashes Dermatochalasis Dermatochalasis   Conjunctiva/Sclera mild melanosis mild melanosis   Cornea Arcus, 1+ PEE Arcus, 1+ fine PEE   Anterior Chamber Deep and Clear Deep and Clear   Iris Round and Dilated, no NVI Round and Dilated, no NVI   Lens PC IOL in good position 2-3+ Nuclear sclerosis w/ brunescence, 2-3+ Cortical cataract   Anterior Vitreous mild syneresis mild syneresis         Fundus Exam       Right Left   Disc Pink and sharp, compact Pink and sharp, PPP   C/D Ratio 0.2 0.2   Macula Flat, Good foveal reflex, RPE mottling, no heme or edema Flat, blunted foveal reflex, central cystic changes, no heme   Vessels Attenuated, mild tortuosity Attenuated, mild tortuosity   Periphery Attached, no heme Attached, no heme  Refraction     Wearing Rx       Sphere Cylinder Axis   Right -4.50 +0.75 040   Left -3.50 +0.50 005           IMAGING AND PROCEDURES  Imaging and Procedures for  07/02/2024  OCT, Retina - OU - Both Eyes       Right Eye Quality was good. Central Foveal Thickness: 235. Progression has no prior data. Findings include normal foveal contour, no IRF, no SRF, myopic contour (Partial PVD).   Left Eye Quality was good. Central Foveal Thickness: 306. Progression has no prior data. Findings include normal foveal contour, no SRF, myopic contour, epiretinal membrane, intraretinal fluid (Mild ERM w/ central cystic changes/foveoschisis).   Notes *Images captured and stored on drive  Diagnosis / Impression:  OD. NFP; no IRF/SRF  OS: Mild ERM w/ central cystic changes/foveoschisis  Clinical management:  See below  Abbreviations: NFP - Normal foveal profile. CME - cystoid macular edema. PED - pigment epithelial detachment. IRF - intraretinal fluid. SRF - subretinal fluid. EZ - ellipsoid zone. ERM - epiretinal membrane. ORA - outer retinal atrophy. ORT - outer retinal tubulation. SRHM - subretinal hyper-reflective material. IRHM - intraretinal hyper-reflective material      Fluorescein  Angiography Optos (Transit OS)       Right Eye Progression has no prior data. Early phase findings include normal observations. Mid/Late phase findings include normal observations.   Left Eye Progression has no prior data. Early phase findings include normal observations. Mid/Late phase findings include normal observations (No leakage, no CME).   Notes **Images stored on drive**  Impression: No leakage, no CME OU           ASSESSMENT/PLAN:   ICD-10-CM   1. 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 Epiretinal membrane (ERM) of left eye  H35.372 OCT, Retina - OU - Both Eyes    Fluorescein  Angiography Optos (Transit OS)    3. Diabetes mellitus type 2 without retinopathy (HCC)  E11.9     4. Diabetes mellitus treated with oral medication (HCC)  E11.9    Z79.84     5. Essential hypertension  I10     6. Hypertensive retinopathy  of both eyes  H35.033 Fluorescein  Angiography Optos (Transit OS)    7. Combined forms of age-related cataract of left eye  H25.812     8. Pseudophakia of right eye  Z96.1      1,2. 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 Diabetes mellitus, type 2 without retinopathy  - A1C 6.8 on 10.22.25  - Pt taking Synjardy  tablets - The incidence, risk factors for progression, natural history and treatment options for diabetic retinopathy  were discussed with patient.   - The need for close monitoring of blood glucose, blood pressure, and serum lipids, avoiding cigarette or any type of tobacco, and the need for long term follow up was also discussed with patient. - f/u in 1 year, sooner prn   5,6. Hypertensive retinopathy OU - discussed importance of tight BP control - monitor   7. Mixed Cataract OS  - The symptoms of cataract, surgical options, and treatments and risks were discussed with patient. - discussed diagnosis and progression - under the expert  management of Dr. CANDIE Gaudy - clear from a retina standpoint to proceed with cataract surgery when pt and surgeon are ready   8. Pseudophakia OD  - s/p CE/IOL 01.09.26 w/ Dr. Glendia Gaudy  - IOL in good position, doing well  - monitor    Ophthalmic Meds Ordered this visit:  No orders of the defined types were placed in this encounter.    Return in about 1 year (around 07/02/2025) for ERM w/ foveoschisis OS, DFE, OCT.  There are no Patient Instructions on file for this visit.  Explained the diagnoses, plan, and follow up with the patient and they expressed understanding.  Patient expressed understanding of the importance of proper follow up care.   This document serves as a record of services personally performed by Redell JUDITHANN Hans, MD, PhD. It was created on their behalf by Wanda GEANNIE Keens, COT an ophthalmic technician. The creation of this record is the provider's dictation and/or activities during the visit.    Electronically signed by:  Wanda GEANNIE Keens, COT  07/09/24 2:34 AM  This document serves as a record of services personally performed by Redell JUDITHANN Hans, MD, PhD. It was created on their behalf by Almetta Pesa, an ophthalmic technician. The creation of this record is the provider's dictation and/or activities during the visit.    Electronically signed by: Almetta Pesa, OA, 07/09/24  2:34 AM  Redell JUDITHANN Hans, M.D., Ph.D. Diseases & Surgery of the Retina and Vitreous [facility] 07/02/2024  I have reviewed the above documentation for accuracy and completeness, and I agree with the above. Redell JUDITHANN Hans, M.D., Ph.D. 07/09/24 2:36 AM   Abbreviations: M myopia (nearsighted); A astigmatism; H hyperopia (farsighted); P presbyopia; Mrx spectacle prescription;  CTL contact lenses; OD right eye; OS left eye; OU both eyes  XT exotropia; ET esotropia; PEK punctate epithelial keratitis; PEE punctate epithelial erosions; DES dry eye syndrome; MGD meibomian gland dysfunction; ATs artificial tears; PFAT's preservative free artificial tears; NSC nuclear sclerotic cataract; PSC posterior subcapsular cataract; ERM epi-retinal membrane; PVD posterior vitreous detachment; RD retinal detachment; DM diabetes mellitus; DR diabetic retinopathy; NPDR non-proliferative diabetic retinopathy; PDR proliferative diabetic retinopathy; CSME clinically significant macular edema; DME diabetic macular edema; dbh dot blot hemorrhages; CWS cotton wool spot; POAG primary open angle glaucoma; C/D cup-to-disc ratio; HVF humphrey visual field; GVF goldmann visual field; OCT optical coherence tomography; IOP intraocular pressure; BRVO Branch retinal vein occlusion; CRVO central retinal vein occlusion; CRAO central  retinal artery occlusion; BRAO branch retinal artery occlusion; RT retinal tear; SB scleral buckle; PPV pars plana vitrectomy; VH Vitreous hemorrhage; PRP panretinal laser photocoagulation; IVK intravitreal kenalog; VMT vitreomacular traction; MH Macular hole;  NVD neovascularization of the disc; NVE neovascularization elsewhere; AREDS age related eye disease study; ARMD age related macular degeneration; POAG primary open angle glaucoma; EBMD epithelial/anterior basement membrane dystrophy; ACIOL anterior chamber intraocular lens; IOL intraocular lens; PCIOL posterior chamber intraocular lens; Phaco/IOL phacoemulsification with intraocular lens placement; PRK photorefractive keratectomy; LASIK laser assisted in situ keratomileusis; HTN hypertension; DM diabetes mellitus; COPD chronic obstructive pulmonary disease     [1] No Known Allergies [2]  Social History Tobacco Use   Smoking status: Former    Current packs/day: 0.00    Average packs/day: 0.3 packs/day for 3.0 years (0.9 ttl pk-yrs)    Types: Cigarettes    Start date: 06/20/1978    Quit date: 06/20/1981    Years since quitting: 43.0    Passive exposure: Past  Smokeless tobacco: Never  Vaping Use   Vaping status: Never Used  Substance Use Topics   Alcohol use: Yes    Alcohol/week: 1.0 standard drink of alcohol    Types: 1 Glasses of wine per week    Comment: occasionally   Drug use: No   "

## 2024-06-26 ENCOUNTER — Other Ambulatory Visit: Payer: Self-pay | Admitting: Internal Medicine

## 2024-06-26 DIAGNOSIS — I739 Peripheral vascular disease, unspecified: Secondary | ICD-10-CM

## 2024-06-26 DIAGNOSIS — E1140 Type 2 diabetes mellitus with diabetic neuropathy, unspecified: Secondary | ICD-10-CM

## 2024-07-02 ENCOUNTER — Ambulatory Visit (INDEPENDENT_AMBULATORY_CARE_PROVIDER_SITE_OTHER): Admitting: Ophthalmology

## 2024-07-02 ENCOUNTER — Encounter (INDEPENDENT_AMBULATORY_CARE_PROVIDER_SITE_OTHER): Payer: Self-pay | Admitting: Ophthalmology

## 2024-07-02 DIAGNOSIS — Z7984 Long term (current) use of oral hypoglycemic drugs: Secondary | ICD-10-CM | POA: Diagnosis not present

## 2024-07-02 DIAGNOSIS — H3581 Retinal edema: Secondary | ICD-10-CM

## 2024-07-02 DIAGNOSIS — E119 Type 2 diabetes mellitus without complications: Secondary | ICD-10-CM

## 2024-07-02 DIAGNOSIS — H442D3 Degenerative myopia with foveoschisis, bilateral eye: Secondary | ICD-10-CM | POA: Diagnosis not present

## 2024-07-02 DIAGNOSIS — Z961 Presence of intraocular lens: Secondary | ICD-10-CM

## 2024-07-02 DIAGNOSIS — H25812 Combined forms of age-related cataract, left eye: Secondary | ICD-10-CM

## 2024-07-02 DIAGNOSIS — H35033 Hypertensive retinopathy, bilateral: Secondary | ICD-10-CM

## 2024-07-02 DIAGNOSIS — H35372 Puckering of macula, left eye: Secondary | ICD-10-CM | POA: Diagnosis not present

## 2024-07-02 DIAGNOSIS — I10 Essential (primary) hypertension: Secondary | ICD-10-CM | POA: Diagnosis not present

## 2024-07-02 LAB — HM DIABETES EYE EXAM

## 2024-07-09 ENCOUNTER — Encounter (INDEPENDENT_AMBULATORY_CARE_PROVIDER_SITE_OTHER): Payer: Self-pay | Admitting: Ophthalmology

## 2024-07-10 ENCOUNTER — Encounter: Payer: Self-pay | Admitting: Internal Medicine

## 2024-07-10 ENCOUNTER — Ambulatory Visit: Admitting: Internal Medicine

## 2024-07-10 ENCOUNTER — Ambulatory Visit: Payer: Self-pay | Admitting: Internal Medicine

## 2024-07-10 ENCOUNTER — Other Ambulatory Visit: Payer: Self-pay | Admitting: Internal Medicine

## 2024-07-10 VITALS — BP 132/82 | HR 89 | Temp 97.8°F | Resp 16 | Ht 66.5 in | Wt 171.4 lb

## 2024-07-10 DIAGNOSIS — Z1231 Encounter for screening mammogram for malignant neoplasm of breast: Secondary | ICD-10-CM | POA: Insufficient documentation

## 2024-07-10 DIAGNOSIS — D539 Nutritional anemia, unspecified: Secondary | ICD-10-CM | POA: Diagnosis not present

## 2024-07-10 DIAGNOSIS — E785 Hyperlipidemia, unspecified: Secondary | ICD-10-CM

## 2024-07-10 DIAGNOSIS — D538 Other specified nutritional anemias: Secondary | ICD-10-CM

## 2024-07-10 DIAGNOSIS — E519 Thiamine deficiency, unspecified: Secondary | ICD-10-CM

## 2024-07-10 DIAGNOSIS — M609 Myositis, unspecified: Secondary | ICD-10-CM

## 2024-07-10 DIAGNOSIS — I10 Essential (primary) hypertension: Secondary | ICD-10-CM

## 2024-07-10 DIAGNOSIS — E1140 Type 2 diabetes mellitus with diabetic neuropathy, unspecified: Secondary | ICD-10-CM | POA: Diagnosis not present

## 2024-07-10 DIAGNOSIS — D500 Iron deficiency anemia secondary to blood loss (chronic): Secondary | ICD-10-CM | POA: Diagnosis not present

## 2024-07-10 DIAGNOSIS — E876 Hypokalemia: Secondary | ICD-10-CM | POA: Diagnosis not present

## 2024-07-10 DIAGNOSIS — T466X5A Adverse effect of antihyperlipidemic and antiarteriosclerotic drugs, initial encounter: Secondary | ICD-10-CM

## 2024-07-10 LAB — BASIC METABOLIC PANEL WITH GFR
BUN: 14 mg/dL (ref 6–23)
CO2: 30 meq/L (ref 19–32)
Calcium: 9 mg/dL (ref 8.4–10.5)
Chloride: 103 meq/L (ref 96–112)
Creatinine, Ser: 0.88 mg/dL (ref 0.40–1.20)
GFR: 67.79 mL/min
Glucose, Bld: 100 mg/dL — ABNORMAL HIGH (ref 70–99)
Potassium: 3.4 meq/L — ABNORMAL LOW (ref 3.5–5.1)
Sodium: 139 meq/L (ref 135–145)

## 2024-07-10 LAB — IBC + FERRITIN
Ferritin: 6.9 ng/mL — ABNORMAL LOW (ref 10.0–291.0)
Iron: 31 ug/dL — ABNORMAL LOW (ref 42–145)
Saturation Ratios: 7.3 % — ABNORMAL LOW (ref 20.0–50.0)
TIBC: 422.8 ug/dL (ref 250.0–450.0)
Transferrin: 302 mg/dL (ref 212.0–360.0)

## 2024-07-10 LAB — CBC WITH DIFFERENTIAL/PLATELET
Basophils Absolute: 0.1 K/uL (ref 0.0–0.1)
Basophils Relative: 1 % (ref 0.0–3.0)
Eosinophils Absolute: 0.3 K/uL (ref 0.0–0.7)
Eosinophils Relative: 3.2 % (ref 0.0–5.0)
HCT: 32.8 % — ABNORMAL LOW (ref 36.0–46.0)
Hemoglobin: 10.7 g/dL — ABNORMAL LOW (ref 12.0–15.0)
Lymphocytes Relative: 24.2 % (ref 12.0–46.0)
Lymphs Abs: 2.5 K/uL (ref 0.7–4.0)
MCHC: 32.7 g/dL (ref 30.0–36.0)
MCV: 79.6 fl (ref 78.0–100.0)
Monocytes Absolute: 0.8 K/uL (ref 0.1–1.0)
Monocytes Relative: 7.4 % (ref 3.0–12.0)
Neutro Abs: 6.6 K/uL (ref 1.4–7.7)
Neutrophils Relative %: 64.2 % (ref 43.0–77.0)
Platelets: 267 K/uL (ref 150.0–400.0)
RBC: 4.13 Mil/uL (ref 3.87–5.11)
RDW: 16.9 % — ABNORMAL HIGH (ref 11.5–15.5)
WBC: 10.3 K/uL (ref 4.0–10.5)

## 2024-07-10 LAB — URINALYSIS, ROUTINE W REFLEX MICROSCOPIC
Bilirubin Urine: NEGATIVE
Hgb urine dipstick: NEGATIVE
Ketones, ur: NEGATIVE
Leukocytes,Ua: NEGATIVE
Nitrite: NEGATIVE
Specific Gravity, Urine: 1.02 (ref 1.000–1.030)
Total Protein, Urine: NEGATIVE
Urine Glucose: >=1000 — AB
Urobilinogen, UA: 0.2 (ref 0.0–1.0)
pH: 5.5 (ref 5.0–8.0)

## 2024-07-10 LAB — LIPID PANEL
Cholesterol: 135 mg/dL (ref 28–200)
HDL: 51.1 mg/dL
LDL Cholesterol: 68 mg/dL (ref 10–99)
NonHDL: 84.21
Total CHOL/HDL Ratio: 3
Triglycerides: 80 mg/dL (ref 10.0–149.0)
VLDL: 16 mg/dL (ref 0.0–40.0)

## 2024-07-10 LAB — VITAMIN B12: Vitamin B-12: 259 pg/mL (ref 211–911)

## 2024-07-10 LAB — MICROALBUMIN / CREATININE URINE RATIO
Creatinine,U: 107 mg/dL
Microalb Creat Ratio: 8.7 mg/g (ref 0.0–30.0)
Microalb, Ur: 0.9 mg/dL (ref 0.7–1.9)

## 2024-07-10 LAB — HEMOGLOBIN A1C: Hgb A1c MFr Bld: 6.7 % — ABNORMAL HIGH (ref 4.6–6.5)

## 2024-07-10 LAB — CK: Total CK: 165 U/L (ref 17–177)

## 2024-07-10 LAB — MAGNESIUM: Magnesium: 1.7 mg/dL (ref 1.5–2.5)

## 2024-07-10 MED ORDER — ACCRUFER 30 MG PO CAPS: 1.0000 | ORAL_CAPSULE | Freq: Two times a day (BID) | ORAL | 0 refills | Status: AC

## 2024-07-10 NOTE — Progress Notes (Unsigned)
 "  Subjective:  Patient ID: Jaime [name], female    DOB: 1956-03-24  Age: 69 y.o. MRN: [id number]  CC: Hypertension, Diabetes, Anemia, and Hyperlipidemia   HPI Jaime [name] presents for f/up ----  Discussed the use of AI scribe software for clinical note transcription with the patient, who gave verbal consent to proceed.  History of Present Illness Jaime [name] is a 69 year old female who presents for follow-up regarding her cataract surgery and vision changes.  She underwent cataract surgery on her right eye on January 9th and plans to have surgery on her left eye on January 30th. Her vision is currently 'funny' due to improved vision in the right eye post-surgery, while the left eye remains blurry. She sees well with her right eye, especially without prescription glasses, but describes her left eye vision as 'a big blur'.  No symptoms related to blood pressure such as headaches, blurred vision, dizziness, or lightheadedness. No swelling in legs or feet.  Regarding anemia, no weakness, fatigue, or shortness of breath, but she mentions feeling 'sleepy'. No recent blood loss.  She reports decreased appetite since starting Synjardy , attributing it to weight loss. No side effects from Synjardy . She also takes vitamin B1 and general vitamins.  She has not had a mammogram in the past year and has not scheduled one yet. She has received flu vaccines and has had COVID vaccines in the past, but cannot recall if she received the one from last fall.     Outpatient Medications Prior to Visit  Medication Sig Dispense Refill   aspirin  325 MG tablet Take 325 mg by mouth daily.      atorvastatin  (LIPITOR) 20 MG tablet TAKE 1 TABLET BY MOUTH DAILY 90 tablet 1   gabapentin  (NEURONTIN ) 100 MG capsule Take 1 capsule (100 mg total) by mouth 3 (three) times daily. 270 capsule 1   Multiple Vitamins-Minerals (MULTIVITAMINS THER. W/MINERALS) TABS Take 1 tablet by mouth daily as needed (nutrition).      pantoprazole  (PROTONIX ) 40 MG tablet TAKE 1 TABLET BY MOUTH EVERY DAY IN THE MORNING 90 tablet 1   potassium chloride  (KLOR-CON  10) 10 MEQ tablet Take 1 tablet (10 mEq total) by mouth 2 (two) times daily. 180 tablet 0   SYNJARDY  XR 10-998 MG TB24 TAKE 2 TABLETS BY MOUTH DAILY (Patient taking differently: Take 1 tablet by mouth daily.) 30 tablet 23   thiamine (VITAMIN B-1) 50 MG tablet Take 1 tablet (50 mg total) by mouth daily. 90 tablet 1   triamterene -hydrochlorothiazide  (DYAZIDE) 37.5-25 MG capsule TAKE 1 CAPSULE BY MOUTH DAILY 90 capsule 0   fluticasone  (FLONASE ) 50 MCG/ACT nasal spray Place 2 sprays into both nostrils daily. (Patient not taking: Reported on 04/26/2024) 48 mL 1   No facility-administered medications prior to visit.    ROS Review of Systems  Objective:  BP 132/82 (BP Location: Left Arm, Patient Position: Sitting, Cuff Size: Normal)   Pulse 89   Temp 97.8 F (36.6 C) (Oral)   Resp 16   Ht 5' 6.5 (1.689 m)   Wt 171 lb 6.4 oz (77.7 kg)   SpO2 96%   BMI 27.25 kg/m   BP Readings from Last 3 Encounters:  07/10/24 132/82  05/02/24 125/80  04/26/24 137/85    Wt Readings from Last 3 Encounters:  07/10/24 171 lb 6.4 oz (77.7 kg)  04/26/24 172 lb 3.2 oz (78.1 kg)  04/10/24 164 lb 12.8 oz (74.8 kg)    Physical Exam  Lab  Results  Component Value Date   WBC 10.3 07/10/2024   HGB 10.7 (L) 07/10/2024   HCT 32.8 (L) 07/10/2024   PLT 267.0 07/10/2024   GLUCOSE 100 (H) 07/10/2024   CHOL 135 07/10/2024   TRIG 80.0 07/10/2024   HDL 51.10 07/10/2024   LDLCALC 68 07/10/2024   ALT 15 04/10/2024   AST 37 04/10/2024   NA 139 07/10/2024   K 3.4 (L) 07/10/2024   CL 103 07/10/2024   CREATININE 0.88 07/10/2024   BUN 14 07/10/2024   CO2 30 07/10/2024   TSH 1.44 04/10/2024   HGBA1C 6.7 (H) 07/10/2024   MICROALBUR 0.9 07/10/2024    ECHOCARDIOGRAM COMPLETE Result Date: 06/03/2024    ECHOCARDIOGRAM REPORT   Patient Name:   Jaime [name] Date of Exam:  06/03/2024 Medical Rec #:  [id number]        Height:       66.5 in Accession #:    [id number]       Weight:       172.2 lb Date of Birth:  1955/11/19        BSA:          1.887 m Patient Age:    68 years         BP:           125/80 mmHg Patient Gender: F                HR:           75 bpm. Exam Location:  [address] Procedure: 2D Echo, 3D Echo, Cardiac Doppler and Color Doppler (Both Spectral            and Color Flow Doppler were utilized during procedure). Indications:     R94.31 Abnormal EKG  History:         Patient has prior history of Echocardiogram examinations, most                  recent 04/21/2020. Risk Factors:Diabetes, Hypertension and                  Dyslipidemia.  Sonographer:     Augustin Seals RDCS Referring Phys:  [id number] ONEIL JAYSON PARCHMENT Diagnosing Phys: Annabella Scarce MD IMPRESSIONS  1. Left ventricular ejection fraction, by estimation, is 60 to 65%. The left ventricle has normal function. The left ventricle has no regional wall motion abnormalities. Left ventricular diastolic parameters are consistent with Grade I diastolic dysfunction (impaired relaxation).  2. Right ventricular systolic function is normal. The right ventricular size is normal. There is normal pulmonary artery systolic pressure.  3. The mitral valve is normal in structure. Trivial mitral valve regurgitation. No evidence of mitral stenosis.  4. The aortic valve is tricuspid. Aortic valve regurgitation is not visualized. No aortic stenosis is present.  5. The inferior vena cava is normal in size with greater than 50% respiratory variability, suggesting right atrial pressure of 3 mmHg. FINDINGS  Left Ventricle: Left ventricular ejection fraction, by estimation, is 60 to 65%. The left ventricle has normal function. The left ventricle has no regional wall motion abnormalities. 3D ejection fraction reviewed and evaluated as part of the interpretation. Alternate measurement of EF is felt to be most reflective of LV function. The  left ventricular internal cavity size was normal in size. There is no left ventricular hypertrophy. Left ventricular diastolic parameters are consistent with Grade I diastolic dysfunction (impaired relaxation). Indeterminate filling pressures. Right Ventricle: The right ventricular size is  normal. No increase in right ventricular wall thickness. Right ventricular systolic function is normal. There is normal pulmonary artery systolic pressure. The tricuspid regurgitant velocity is 2.57 m/s, and  with an assumed right atrial pressure of 3 mmHg, the estimated right ventricular systolic pressure is 29.4 mmHg. Left Atrium: Left atrial size was normal in size. Right Atrium: Right atrial size was normal in size. Pericardium: There is no evidence of pericardial effusion. Mitral Valve: The mitral valve is normal in structure. Trivial mitral valve regurgitation. No evidence of mitral valve stenosis. Tricuspid Valve: The tricuspid valve is normal in structure. Tricuspid valve regurgitation is trivial. No evidence of tricuspid stenosis. Aortic Valve: The aortic valve is tricuspid. Aortic valve regurgitation is not visualized. No aortic stenosis is present. Pulmonic Valve: The pulmonic valve was normal in structure. Pulmonic valve regurgitation is not visualized. No evidence of pulmonic stenosis. Aorta: The aortic root is normal in size and structure. Venous: The inferior vena cava is normal in size with greater than 50% respiratory variability, suggesting right atrial pressure of 3 mmHg. IAS/Shunts: No atrial level shunt detected by color flow Doppler. Additional Comments: 3D was performed not requiring image post processing on an independent workstation and was normal.  LEFT VENTRICLE PLAX 2D LVIDd:         4.20 cm   Diastology LVIDs:         2.80 cm   LV e' medial:    4.70 cm/s LV PW:         1.00 cm   LV E/e' medial:  11.7 LV IVS:        1.00 cm   LV e' lateral:   6.89 cm/s LVOT diam:     2.00 cm   LV E/e' lateral: 8.0 LV  SV:         44 LV SV Index:   23 LVOT Area:     3.14 cm                           3D Volume EF:                          3D EF:        50 %                          LV EDV:       115 ml                          LV ESV:       57 ml                          LV SV:        58 ml RIGHT VENTRICLE             IVC RV Basal diam:  2.80 cm     IVC diam: 2.00 cm RV Mid diam:    2.50 cm RV S prime:     10.50 cm/s  PULMONARY VEINS TAPSE (M-mode): 1.5 cm      Diastolic Velocity: 44.70 cm/s                             S/D Velocity:       1.10  Systolic Velocity:  51.30 cm/s LEFT ATRIUM             Index        RIGHT ATRIUM          Index LA diam:        3.90 cm 2.07 cm/m   RA Area:     9.06 cm LA Vol (A2C):   28.8 ml 15.26 ml/m  RA Volume:   17.40 ml 9.22 ml/m LA Vol (A4C):   27.8 ml 14.73 ml/m LA Biplane Vol: 29.4 ml 15.58 ml/m  AORTIC VALVE LVOT Vmax:   66.90 cm/s LVOT Vmean:  45.600 cm/s LVOT VTI:    0.140 m  AORTA Ao Root diam: 2.60 cm Ao Asc diam:  3.40 cm MITRAL VALVE               TRICUSPID VALVE MV Area (PHT): 2.37 cm    TR Peak grad:   26.4 mmHg MV Decel Time: 320 msec    TR Vmax:        257.00 cm/s MV E velocity: 55.00 cm/s MV A velocity: 64.00 cm/s  SHUNTS MV E/A ratio:  0.86        Systemic VTI:  0.14 m                            Systemic Diam: 2.00 cm Annabella Scarce MD Electronically signed by Annabella Scarce MD Signature Date/Time: 06/03/2024/11:51:47 AM    Final (Updated)     Assessment & Plan:  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) -     Hemoglobin A1c; Future -     Microalbumin / creatinine urine ratio; Future -     Urinalysis, Routine w reflex microscopic; Future -     Vitamin B12; Future  Statin-induced myositis -     CK; Future  Hypokalemia -     Basic metabolic panel with GFR; Future -     Magnesium ; Future  Hypomagnesemia -     Magnesium ; Future  Anemia due to acquired thiamine deficiency -     CBC with Differential/Platelet;  Future  Hyperlipidemia LDL goal <130 -     Lipid panel; Future -     CK; Future  Essential hypertension -     Urinalysis, Routine w reflex microscopic; Future  Deficiency anemia -     IBC + Ferritin; Future -     Zinc ; Future  Screening mammogram for breast cancer -     Digital Screening Mammogram, Left and Right; Future  Iron deficiency anemia due to chronic blood loss -     ACCRUFeR ; Take 1 capsule (30 mg total) by mouth in the morning and at bedtime.  Dispense: 180 capsule; Refill: 0 -     Hemoccult Cards (X3 cards); Future     Follow-up: Return in about 4 months (around 11/07/2024).  Debby Molt, MD "

## 2024-07-10 NOTE — Patient Instructions (Signed)

## 2024-07-11 ENCOUNTER — Other Ambulatory Visit

## 2024-07-11 MED ORDER — POTASSIUM CHLORIDE ER 10 MEQ PO TBCR: 10.0000 meq | EXTENDED_RELEASE_TABLET | Freq: Two times a day (BID) | ORAL | 0 refills | Status: AC

## 2024-07-11 NOTE — Telephone Encounter (Signed)
Changed to venofer

## 2024-07-12 ENCOUNTER — Other Ambulatory Visit (HOSPITAL_COMMUNITY): Payer: Self-pay | Admitting: Internal Medicine

## 2024-07-12 NOTE — Progress Notes (Signed)
 Patient referred to infusion pharmacy team for Venofer 200mg  IV x 5 doses over 2 weeks  Site of care - Site of care: [facility] Dx code - 50.0/I51.89 Infusion appointments - Scheduling team will schedule patient as soon as possible.   Sherry Pennant, PharmD, MPH, BCPS, CPP Clinical Pharmacist

## 2024-07-15 ENCOUNTER — Telehealth (HOSPITAL_COMMUNITY): Payer: Self-pay | Admitting: Pharmacy Technician

## 2024-07-15 NOTE — Telephone Encounter (Signed)
 Auth Submission: NO AUTH NEEDED Site of care: [facility] Payer: [employer] MEDICARE Medication & CPT/J Code(s) submitted: Venofer (Iron Sucrose) J1756 Diagnosis Code: D53.8, E51.9, D53.9, D50.0, D64.9 Route of submission (phone, fax, portal):  Phone # Fax # Auth type: Buy/Bill HB Units/visits requested: 200MG  X 5 DOSES Reference number: [id number] Approval from: 07/15/2024 to 10/13/24    Dagoberto Armour, CPhT [facility] Phone: [phone] 07/15/2024

## 2024-07-16 ENCOUNTER — Telehealth: Payer: Self-pay

## 2024-07-16 DIAGNOSIS — E118 Type 2 diabetes mellitus with unspecified complications: Secondary | ICD-10-CM

## 2024-07-16 MED ORDER — SYNJARDY XR 10-1000 MG PO TB24: 1.0000 | ORAL_TABLET | Freq: Every day | ORAL | 11 refills | Status: AC

## 2024-07-16 NOTE — Addendum Note (Signed)
 Addended by: MERCEDA DARRELYN SAUNDERS on: 07/16/2024 04:33 PM   Modules accepted: Orders

## 2024-07-16 NOTE — Progress Notes (Signed)
" ° °  07/16/2024 Name: Jaime [name] MRN: [id number] DOB: Jan 25, 1956  Chief Complaint  Patient presents with   Diabetes    Jaime [name] is a 69 y.o. year old female who presented for a telephone visit. Reviewed her most recent A1c was at goal, and so could proceed with taking Synjardy  as once daily rather than as prescribed (twice daily). Informed patient that we will send an updated prescription to match how she is taking the medication. 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    "

## 2024-07-19 LAB — ZINC: Zinc: 73 ug/dL (ref 60–130)

## 2024-07-24 ENCOUNTER — Ambulatory Visit
Admission: RE | Admit: 2024-07-24 | Discharge: 2024-07-24 | Disposition: A | Source: Ambulatory Visit | Attending: Internal Medicine | Admitting: Internal Medicine

## 2024-07-24 DIAGNOSIS — Z1231 Encounter for screening mammogram for malignant neoplasm of breast: Secondary | ICD-10-CM

## 2024-07-30 ENCOUNTER — Encounter (HOSPITAL_COMMUNITY)

## 2024-08-07 ENCOUNTER — Encounter (HOSPITAL_COMMUNITY)

## 2024-08-14 ENCOUNTER — Encounter (HOSPITAL_COMMUNITY)

## 2024-08-21 ENCOUNTER — Encounter (HOSPITAL_COMMUNITY)

## 2024-08-28 ENCOUNTER — Encounter (HOSPITAL_COMMUNITY)

## 2024-09-05 ENCOUNTER — Ambulatory Visit

## 2024-09-05 ENCOUNTER — Encounter: Admitting: Internal Medicine

## 2024-11-13 ENCOUNTER — Ambulatory Visit: Admitting: Internal Medicine

## 2025-07-02 ENCOUNTER — Encounter (INDEPENDENT_AMBULATORY_CARE_PROVIDER_SITE_OTHER): Admitting: Ophthalmology
